# Patient Record
Sex: Female | Born: 1955 | Race: Black or African American | Hispanic: No | Smoking: Former smoker
Health system: Southern US, Community
[De-identification: ages and names within clinical notes are randomized; demographics above are authoritative.]

## PROBLEM LIST (undated history)

## (undated) DIAGNOSIS — M069 Rheumatoid arthritis, unspecified: Secondary | ICD-10-CM

## (undated) DIAGNOSIS — C801 Malignant (primary) neoplasm, unspecified: Secondary | ICD-10-CM

## (undated) DIAGNOSIS — Z87898 Personal history of other specified conditions: Secondary | ICD-10-CM

## (undated) DIAGNOSIS — K2100 Gastro-esophageal reflux disease with esophagitis, without bleeding: Secondary | ICD-10-CM

## (undated) DIAGNOSIS — R1032 Left lower quadrant pain: Secondary | ICD-10-CM

## (undated) DIAGNOSIS — R198 Other specified symptoms and signs involving the digestive system and abdomen: Secondary | ICD-10-CM

## (undated) DIAGNOSIS — M2550 Pain in unspecified joint: Secondary | ICD-10-CM

## (undated) DIAGNOSIS — R634 Abnormal weight loss: Secondary | ICD-10-CM

## (undated) DIAGNOSIS — E039 Hypothyroidism, unspecified: Secondary | ICD-10-CM

## (undated) DIAGNOSIS — I209 Angina pectoris, unspecified: Secondary | ICD-10-CM

## (undated) DIAGNOSIS — F39 Unspecified mood [affective] disorder: Secondary | ICD-10-CM

## (undated) DIAGNOSIS — R197 Diarrhea, unspecified: Secondary | ICD-10-CM

## (undated) DIAGNOSIS — Z8639 Personal history of other endocrine, nutritional and metabolic disease: Secondary | ICD-10-CM

## (undated) DIAGNOSIS — F32A Depression, unspecified: Secondary | ICD-10-CM

## (undated) DIAGNOSIS — B351 Tinea unguium: Secondary | ICD-10-CM

## (undated) DIAGNOSIS — F329 Major depressive disorder, single episode, unspecified: Secondary | ICD-10-CM

## (undated) DIAGNOSIS — K649 Unspecified hemorrhoids: Secondary | ICD-10-CM

## (undated) DIAGNOSIS — D649 Anemia, unspecified: Secondary | ICD-10-CM

## (undated) DIAGNOSIS — K5792 Diverticulitis of intestine, part unspecified, without perforation or abscess without bleeding: Secondary | ICD-10-CM

## (undated) DIAGNOSIS — M797 Fibromyalgia: Secondary | ICD-10-CM

## (undated) DIAGNOSIS — Z860101 Personal history of adenomatous and serrated colon polyps: Secondary | ICD-10-CM

## (undated) DIAGNOSIS — M1990 Unspecified osteoarthritis, unspecified site: Secondary | ICD-10-CM

## (undated) DIAGNOSIS — M3500 Sicca syndrome, unspecified: Secondary | ICD-10-CM

## (undated) DIAGNOSIS — B354 Tinea corporis: Secondary | ICD-10-CM

## (undated) DIAGNOSIS — G5600 Carpal tunnel syndrome, unspecified upper limb: Secondary | ICD-10-CM

## (undated) DIAGNOSIS — F419 Anxiety disorder, unspecified: Secondary | ICD-10-CM

## (undated) DIAGNOSIS — R112 Nausea with vomiting, unspecified: Secondary | ICD-10-CM

## (undated) DIAGNOSIS — Z86010 Personal history of colonic polyps: Secondary | ICD-10-CM

## (undated) DIAGNOSIS — G459 Transient cerebral ischemic attack, unspecified: Secondary | ICD-10-CM

## (undated) DIAGNOSIS — R1115 Cyclical vomiting syndrome unrelated to migraine: Secondary | ICD-10-CM

## (undated) DIAGNOSIS — R6883 Chills (without fever): Secondary | ICD-10-CM

## (undated) HISTORY — DX: Left lower quadrant pain: R10.32

## (undated) HISTORY — DX: Personal history of colonic polyps: Z86.010

## (undated) HISTORY — PX: CHOLECYSTECTOMY: SHX55

## (undated) HISTORY — DX: Hypothyroidism, unspecified: E03.9

## (undated) HISTORY — DX: Nausea with vomiting, unspecified: R11.2

## (undated) HISTORY — DX: Unspecified mood (affective) disorder: F39

## (undated) HISTORY — DX: Morbid (severe) obesity due to excess calories: E66.01

## (undated) HISTORY — DX: Rheumatoid arthritis, unspecified: M06.9

## (undated) HISTORY — DX: Personal history of adenomatous and serrated colon polyps: Z86.0101

## (undated) HISTORY — DX: Tinea unguium: B35.1

## (undated) HISTORY — PX: OTHER SURGICAL HISTORY: SHX169

## (undated) HISTORY — DX: Personal history of other specified conditions: Z87.898

## (undated) HISTORY — DX: Cyclical vomiting syndrome unrelated to migraine: R11.15

## (undated) HISTORY — DX: Pain in unspecified joint: M25.50

## (undated) HISTORY — DX: Gastro-esophageal reflux disease with esophagitis, without bleeding: K21.00

## (undated) HISTORY — DX: Depression, unspecified: F32.A

## (undated) HISTORY — DX: Abnormal weight loss: R63.4

## (undated) HISTORY — DX: Carpal tunnel syndrome, unspecified upper limb: G56.00

## (undated) HISTORY — DX: Personal history of other endocrine, nutritional and metabolic disease: Z86.39

## (undated) HISTORY — DX: Anemia, unspecified: D64.9

## (undated) HISTORY — PX: CARPAL TUNNEL RELEASE: SHX101

## (undated) HISTORY — DX: Other specified symptoms and signs involving the digestive system and abdomen: R19.8

## (undated) HISTORY — DX: Tinea corporis: B35.4

## (undated) HISTORY — DX: Unspecified hemorrhoids: K64.9

## (undated) HISTORY — PX: ADENOIDECTOMY: SUR15

## (undated) HISTORY — DX: Chills (without fever): R68.83

## (undated) HISTORY — DX: Fibromyalgia: M79.7

## (undated) HISTORY — DX: Diarrhea, unspecified: R19.7

---
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
## 1998-03-15 ENCOUNTER — Emergency Department (HOSPITAL_COMMUNITY): Admission: EM | Admit: 1998-03-15 | Discharge: 1998-03-16 | Payer: Self-pay | Admitting: Emergency Medicine

## 1998-11-29 ENCOUNTER — Inpatient Hospital Stay (HOSPITAL_COMMUNITY): Admission: AD | Admit: 1998-11-29 | Discharge: 1998-11-29 | Payer: Self-pay | Admitting: Obstetrics and Gynecology

## 1999-04-26 ENCOUNTER — Encounter: Admission: RE | Admit: 1999-04-26 | Discharge: 1999-07-25 | Payer: Self-pay | Admitting: Obstetrics and Gynecology

## 1999-07-06 ENCOUNTER — Encounter (INDEPENDENT_AMBULATORY_CARE_PROVIDER_SITE_OTHER): Payer: Self-pay | Admitting: Specialist

## 1999-07-06 ENCOUNTER — Encounter: Payer: Self-pay | Admitting: Obstetrics and Gynecology

## 1999-07-06 ENCOUNTER — Inpatient Hospital Stay (HOSPITAL_COMMUNITY): Admission: AD | Admit: 1999-07-06 | Discharge: 1999-07-12 | Payer: Self-pay | Admitting: Obstetrics and Gynecology

## 1999-07-13 ENCOUNTER — Encounter: Admission: RE | Admit: 1999-07-13 | Discharge: 1999-10-11 | Payer: Self-pay | Admitting: Obstetrics and Gynecology

## 1999-07-14 ENCOUNTER — Encounter: Admission: RE | Admit: 1999-07-14 | Discharge: 1999-07-14 | Payer: Self-pay | Admitting: Obstetrics and Gynecology

## 1999-07-14 ENCOUNTER — Encounter: Payer: Self-pay | Admitting: Obstetrics and Gynecology

## 1999-10-13 ENCOUNTER — Encounter (HOSPITAL_COMMUNITY): Admission: RE | Admit: 1999-10-13 | Discharge: 2000-01-11 | Payer: Self-pay | Admitting: Obstetrics and Gynecology

## 2000-01-13 ENCOUNTER — Encounter: Admission: RE | Admit: 2000-01-13 | Discharge: 2000-04-12 | Payer: Self-pay | Admitting: Obstetrics and Gynecology

## 2000-04-13 ENCOUNTER — Encounter: Admission: RE | Admit: 2000-04-13 | Discharge: 2000-05-11 | Payer: Self-pay | Admitting: Obstetrics and Gynecology

## 2000-09-07 ENCOUNTER — Other Ambulatory Visit: Admission: RE | Admit: 2000-09-07 | Discharge: 2000-09-07 | Payer: Self-pay | Admitting: Obstetrics and Gynecology

## 2003-03-18 ENCOUNTER — Encounter: Admission: RE | Admit: 2003-03-18 | Discharge: 2003-03-18 | Payer: Self-pay | Admitting: Family Medicine

## 2004-07-02 ENCOUNTER — Ambulatory Visit (HOSPITAL_COMMUNITY): Admission: RE | Admit: 2004-07-02 | Discharge: 2004-07-02 | Payer: Self-pay | Admitting: Obstetrics and Gynecology

## 2004-07-02 ENCOUNTER — Encounter (INDEPENDENT_AMBULATORY_CARE_PROVIDER_SITE_OTHER): Payer: Self-pay | Admitting: Specialist

## 2007-08-03 ENCOUNTER — Emergency Department (HOSPITAL_COMMUNITY): Admission: EM | Admit: 2007-08-03 | Discharge: 2007-08-03 | Payer: Self-pay | Admitting: Emergency Medicine

## 2009-07-16 ENCOUNTER — Encounter: Admission: RE | Admit: 2009-07-16 | Discharge: 2009-07-16 | Payer: Self-pay | Admitting: Family Medicine

## 2009-11-05 ENCOUNTER — Emergency Department (HOSPITAL_COMMUNITY): Admission: EM | Admit: 2009-11-05 | Discharge: 2009-11-05 | Payer: Self-pay | Admitting: Emergency Medicine

## 2010-02-15 ENCOUNTER — Encounter: Admission: RE | Admit: 2010-02-15 | Discharge: 2010-02-15 | Payer: Self-pay | Admitting: Family Medicine

## 2010-06-28 ENCOUNTER — Other Ambulatory Visit: Payer: Self-pay | Admitting: Family Medicine

## 2010-06-28 DIAGNOSIS — Z1231 Encounter for screening mammogram for malignant neoplasm of breast: Secondary | ICD-10-CM

## 2010-07-07 ENCOUNTER — Ambulatory Visit
Admission: RE | Admit: 2010-07-07 | Discharge: 2010-07-07 | Disposition: A | Discharge status: Home / Self Care | Payer: BC Managed Care – PPO | Source: Ambulatory Visit | Attending: Family Medicine | Admitting: Family Medicine

## 2010-07-07 DIAGNOSIS — Z1231 Encounter for screening mammogram for malignant neoplasm of breast: Secondary | ICD-10-CM

## 2010-07-25 LAB — CBC
HCT: 39.5 % (ref 36.0–46.0)
Hemoglobin: 13.5 g/dL (ref 12.0–15.0)
MCH: 30.2 pg (ref 26.0–34.0)
MCHC: 34.2 g/dL (ref 30.0–36.0)
MCV: 88.5 fL (ref 78.0–100.0)
Platelets: 222 10*3/uL (ref 150–400)
RBC: 4.46 MIL/uL (ref 3.87–5.11)
RDW: 14.2 % (ref 11.5–15.5)
WBC: 5 10*3/uL (ref 4.0–10.5)

## 2010-07-25 LAB — BASIC METABOLIC PANEL
BUN: 14 mg/dL (ref 6–23)
CO2: 26 mEq/L (ref 19–32)
Calcium: 9 mg/dL (ref 8.4–10.5)
Chloride: 103 mEq/L (ref 96–112)
Creatinine, Ser: 0.91 mg/dL (ref 0.4–1.2)
GFR calc Af Amer: >60 mL/min (ref >60)
GFR calc non Af Amer: >60 mL/min (ref >60)
Glucose, Bld: 113 mg/dL — ABNORMAL HIGH (ref 70–99)
Potassium: 4 mEq/L (ref 3.5–5.1)
Sodium: 136 mEq/L (ref 135–145)

## 2010-07-25 LAB — DIFFERENTIAL
Basophils Absolute: 0 10*3/uL (ref 0.0–0.1)
Basophils Relative: 1 % (ref 0–1)
Eosinophils Absolute: 0.1 10*3/uL (ref 0.0–0.7)
Eosinophils Relative: 1 % (ref 0–5)
Lymphocytes Relative: 39 % (ref 12–46)
Lymphs Abs: 1.9 10*3/uL (ref 0.7–4.0)
Monocytes Absolute: 0.4 10*3/uL (ref 0.1–1.0)
Monocytes Relative: 9 % (ref 3–12)
Neutro Abs: 2.6 10*3/uL (ref 1.7–7.7)
Neutrophils Relative %: 51 % (ref 43–77)

## 2010-07-25 LAB — PROTIME-INR
INR: 0.89 (ref 0.00–1.49)
Prothrombin Time: 12 seconds (ref 11.6–15.2)

## 2010-07-25 LAB — D-DIMER, QUANTITATIVE: D-Dimer, Quant: 0.51 ug/mL-FEU — ABNORMAL HIGH (ref 0.00–0.48)

## 2010-07-25 LAB — APTT: aPTT: 36 seconds (ref 24–37)

## 2010-09-10 ENCOUNTER — Other Ambulatory Visit: Payer: Self-pay | Admitting: Obstetrics and Gynecology

## 2010-09-10 ENCOUNTER — Ambulatory Visit (HOSPITAL_COMMUNITY)
Admission: RE | Admit: 2010-09-10 | Discharge: 2010-09-10 | Disposition: A | Discharge status: Home / Self Care | Payer: BC Managed Care – PPO | Source: Ambulatory Visit | Attending: Obstetrics and Gynecology | Admitting: Obstetrics and Gynecology

## 2010-09-10 DIAGNOSIS — R9389 Abnormal findings on diagnostic imaging of other specified body structures: Secondary | ICD-10-CM | POA: Insufficient documentation

## 2010-09-10 DIAGNOSIS — N950 Postmenopausal bleeding: Secondary | ICD-10-CM | POA: Insufficient documentation

## 2010-09-10 LAB — COMPREHENSIVE METABOLIC PANEL
ALT: 20 U/L (ref 0–35)
AST: 18 U/L (ref 0–37)
Albumin: 4.1 g/dL (ref 3.5–5.2)
Alkaline Phosphatase: 52 U/L (ref 39–117)
BUN: 19 mg/dL (ref 6–23)
CO2: 25 mEq/L (ref 19–32)
Calcium: 10.2 mg/dL (ref 8.4–10.5)
Chloride: 104 mEq/L (ref 96–112)
Creatinine, Ser: 0.97 mg/dL (ref 0.4–1.2)
GFR calc Af Amer: >60 mL/min (ref >60)
GFR calc non Af Amer: >60 mL/min (ref >60)
Glucose, Bld: 105 mg/dL — ABNORMAL HIGH (ref 70–99)
Potassium: 4.1 mEq/L (ref 3.5–5.1)
Sodium: 140 mEq/L (ref 135–145)
Total Bilirubin: 0.3 mg/dL (ref 0.3–1.2)
Total Protein: 7.2 g/dL (ref 6.0–8.3)

## 2010-09-10 LAB — CBC
HCT: 40.7 % (ref 36.0–46.0)
Hemoglobin: 13.4 g/dL (ref 12.0–15.0)
MCH: 28.3 pg (ref 26.0–34.0)
MCHC: 32.9 g/dL (ref 30.0–36.0)
MCV: 86 fL (ref 78.0–100.0)
Platelets: 219 10*3/uL (ref 150–400)
RBC: 4.73 MIL/uL (ref 3.87–5.11)
RDW: 14.5 % (ref 11.5–15.5)
WBC: 4.4 10*3/uL (ref 4.0–10.5)

## 2010-09-14 NOTE — Op Note (Signed)
  NAME:  Jennifer Franco, GRAMAJO NO.:  [id number]  MEDICAL RECORD NO.:  [id number]           PATIENT TYPE:  O  LOCATION:  [facility]                          [facility]  PHYSICIAN:  Lenoard Aden, M.D.DATE OF BIRTH:  02-Jun-1955  DATE OF PROCEDURE:  09/10/2010 DATE OF DISCHARGE:                              OPERATIVE REPORT   PREOPERATIVE DIAGNOSES:  Postmenopausal bleeding with thickened 18 mm endometrium and questionable structural lesion.  POSTOPERATIVE DIAGNOSES:  Normal endometrial cavity with 2 areas of hemorrhagic focus on the anterior and posterior wall.  PROCEDURES:  Diagnostic hysteroscopy, resectoscope, dilation and curettage.  SURGEON:  Lenoard Aden, MD  ASSISTANT:  None.  ANESTHESIA:  General and local.  ESTIMATED BLOOD LOSS:  Less than 50 mL.  FLUID DEFICIT:  70 mL.  COMPLICATIONS:  None.  DRAINS:  Foley.  COUNTS:  Correct.  The patient recovered in good condition.  BRIEF OPERATIVE NOTE:  After being apprised the risks of anesthesia, infection, bleeding, injury to abdominal organs, need for repair delayed versus complications to include bowel and bladder injury, need for repair, inability to cure cancer, the patient was brought to the operating room where she was administered general anesthetic without complications, prepped and draped in sterile fashion, catheterized till the bladder was empty.  Exam under anesthesia revealed a small anteflexed uterus.  No adnexal masses.  Dilute Marcaine solution was placed, standard paracervical block 20 mL total.  Dilute Pitressin solution placed, 3 and 9 o'clock at the cervicovaginal junction.  Cervix was grasped using a single-tooth tenaculum, dilated easily up to #27 [facility] dilator.  Hysteroscope was placed.  Visualization revealed a normal endometrial cavity.  There were 2 areas, one on the anterior wall and one on the posterior wall where there appears to be some hemorrhagic- appearing  discoloration of the wall.  These areas were resected using the right-angle loop in 1 pass anteriorly and 1 pass posteriorly.  A sharp curettage was used in a four quadrant method to obtain endometrial curettings.  Revisualization of the endometrial cavity revealed bilateral normal tubal ostia and otherwise normal endometrial cavity.  Good hemostasis was noted.  All instruments were removed.  The patient tolerated the procedure well, was awakened and transferred to recovery in good condition.     Lenoard Aden, M.D.     RJT/MEDQ  D:  09/10/2010  T:  09/11/2010  Job:  [id number]  Electronically Signed by Olivia Mackie M.D. on 09/14/2010 02:45:40 PM

## 2010-09-24 NOTE — Op Note (Signed)
NAME:  Jennifer Franco, Jennifer Franco NO.:  [id number]   MEDICAL RECORD NO.:  [id number]          PATIENT TYPE:  AMB   LOCATION:  [facility]                           [facility]   PHYSICIAN:  Lenoard Aden, M.D.DATE OF BIRTH:  05/22/1955   DATE OF PROCEDURE:  07/02/2004  DATE OF DISCHARGE:                                 OPERATIVE REPORT   CHIEF COMPLAINT:  Dysfunctional uterine bleeding with questionable  structural defect.   POSTOPERATIVE DIAGNOSIS:  Multiple endometrial polyps.   PROCEDURE:  Diagnostic hysteroscopy with resectoscopic polypectomy and  dilatation and curettage.   SURGEON:  Lenoard Aden, M.D.   ANESTHESIA:  General.   ESTIMATED BLOOD LOSS:  Less than 50 mL.   FLUID DEFICIT:  100 mL.   COMPLICATIONS:  None.   Patient to recovery in good condition.   BRIEF OPERATIVE NOTE:  After being apprised of the risks of anesthesia,  infection, bleeding, injury to abdominal organs with need for repair,  uterine perforation and possible need for repair, the patient was brought to  the operating room, where she was administered a general anesthetic without  complications, prepped and draped in the usual sterile fashion, catheterized  until the bladder is empty.  Exam under anesthesia reveals a bulky,  anteflexed uterus.  Dilute Pitressin solution placed at 3 and 9 o'clock at  the cervicovaginal junction after placement of a speculum, total 16 mL  placed.  The uterus is easily dilated up to a #33 [employer] dilator,  hysteroscope placed.  Visualization reveals a markedly thickened anterior  and posterior wall with a bulging mass into the endometrial cavity  consistent with a large endometrial polyp.  At this point this polyp is  resected in its entirety using a double right-angle loop, and posterior wall  is resected as well.  Good hemostasis is noted, pictures are taken.  Endometrial cavity appears clean.  D&C is performed.  Endometrial curetting  sent to  pathology.  Fluid deficit 100 mL.  The procedure is terminated.  All  instruments are removed.  The patient is awakened and transferred to  recovery in good condition.      RJT/MEDQ  D:  07/02/2004  T:  07/02/2004  Job:  [id number]

## 2010-09-24 NOTE — Discharge Summary (Signed)
[facility]  Patient:    Jennifer Franco, Jennifer Franco                 MRN: [id number] Adm. Date:  [id number] Disc. Date: [id number] Attending:  Maxie Better                           Discharge Summary  DISCHARGE DIAGNOSES:          1. Preterm prolonged rupture of membranes.                               2. Twin gestation at 77+ weeks gestational age.                               3. Preterm labor.                               4. Class A2 diabetes mellitus.                               5. Hypothyroidism.                               6. Rh negative.                               7. Advanced maternal age.                               8. Previous cesarean section.                               9. Morbid obesity.  PROCEDURE:                    Repeat low transverse cesarean section on July 09, 1999.  HISTORY OF PRESENT ILLNESS:   A 55 year old woman, gravida 4, para 0-1-2-1, [facility] Sep 14, 1999, with twin gestation at 30-1/[redacted] weeks gestational age admitted with preterm premature rupture of membranes.  The patient had leaked fluid since 6:50 a.m. on July 06, 1999.  Antenatal course was remarkable for:  1) Insulin-dependent gestational diabetes.  The patient has controlled her glucose values suboptimally. 2) Advanced maternal age.  Amniocentesis performed at [facility] revealed 46XX and 46XY chromosomes.  HOSPITAL COURSE:              The patient was admitted on July 06, 1999. At that time she was afebrile.  No contractions were seen and fetal heart rate tracing was reassuring.  She was given betamethasone on February 27 and February 28 to enhance chance of fetal lung maturity.  Penicillin-G chemoprophylaxis for Group B Strep was given.  Uterine contractions increased and magnesium sulfate was administered until 24 hours after the second betamethasone injection.  Labor ensued on July 09, 1999.  Cesarean section was advised because of  the transverse lie of twin B.  The patient was taken to the operating room where she underwent repeat low transverse cesarean section by Sung Amabile. Roslyn Smiling, M.D. and Gerlene Burdock  Mickeal Needy, M.D.  under spinal anesthesia.  Twin A was vertex female, 1500 grams with Apgars of 7 and 9.  Copious fluid was noted in the sac B.  The baby moved after membranes were ruptured to a backup transverse lie and was delivered as a total breech extraction. Twin B was a female, 1224 grams with Apgars of 4 and 9.  Arterial cord pH was 7.26 for twin A and 7.19 for twin B.  The patients postoperative course was unremarkable.  Her hemoglobin stabilized t 12.1.  Diet was advanced without difficulty.  She was discharged to home on the  third postoperative day.  RhoGAM was given before discharge.  DISCHARGE MEDICATIONS:        1. Prenatal vitamins.                               2. Tylox 30 tablets one to two p.o. q.3-4h. p.r.n.                                  pain.                               3. Nonsteroidal anti-inflammatory agents.  FOLLOW-UP:                    Will be in the office later in the week for staple removal.  Two-hour glucose tolerance test will be performed postpartum.  She was encouraged to initiate weight reduction.  She was given routine status post cesarean section instructions. DD:  08/07/99 TD:  08/07/99 Job: [id number]

## 2010-09-24 NOTE — H&P (Signed)
NAME:  Jennifer Franco, Jennifer Franco NO.:  [id number]   MEDICAL RECORD NO.:  [id number]          PATIENT TYPE:  AMB   LOCATION:  [facility]                           [facility]   PHYSICIAN:  Lenoard Aden, M.D.DATE OF BIRTH:  November 01, 1955   DATE OF ADMISSION:  07/02/2004  DATE OF DISCHARGE:                                HISTORY & PHYSICAL   CHIEF COMPLAINT:  Dysfunctional uterine bleeding.   HISTORY OF PRESENT ILLNESS:  Patient is a 55 year old African-American  female with dysfunctional uterine bleeding and structural lesion for  definitive therapy.  She is a G2, P3.  Medications include Synthroid and  vitamin B complex.  She has no known drug allergies.  She has a  noncontributory family history.  She has a history of two previous  deliveries with a history of twins noted,  previously has been status post  __________ .   PHYSICAL EXAMINATION:  She is a well-developed, well-nourished African-  American female in no acute distress.  HEENT normal.  Lungs clear.  Heart  regular rhythm.  Abdomen soft, nontender.  Pelvic exam reveals a normal-size  uterus with questionable posterior wall defect, no adnexal masses.   IMPRESSION:  Questionable structural lesion with known dysfunctional uterine  bleeding.   PLAN:  Proceed with diagnostic hysteroscopy via resectoscope D&C.  Risks of  anesthesia, infection, bleeding, injury to abdominal organs, need for  repairs discussed, delayed versus immediate complications to include bowel  and bladder injury are noted.  Patient acknowledges and wishes to proceed.      RJT/MEDQ  D:  07/01/2004  T:  07/01/2004  Job:  [id number]

## 2011-01-31 LAB — POCT CARDIAC MARKERS
CKMB, poc: 1
CKMB, poc: <1 — ABNORMAL LOW
Myoglobin, poc: 47.6
Myoglobin, poc: 59.7
Operator id: 285841
Operator id: 288831
Troponin i, poc: <0.05
Troponin i, poc: <0.05

## 2011-01-31 LAB — CBC
HCT: 35.9 — ABNORMAL LOW
Hemoglobin: 12
MCHC: 33.3
MCV: 85.6
Platelets: 317
RBC: 4.2
RDW: 14.3
WBC: 5.5

## 2011-01-31 LAB — POCT I-STAT, CHEM 8
BUN: 18
Calcium, Ion: 1.14
Chloride: 107
Creatinine, Ser: 1.2
Glucose, Bld: 90
HCT: 38
Hemoglobin: 12.9
Potassium: 4
Sodium: 139
TCO2: 25

## 2011-01-31 LAB — DIFFERENTIAL
Basophils Absolute: 0.1
Basophils Relative: 1
Eosinophils Absolute: 0.1
Eosinophils Relative: 2
Lymphocytes Relative: 35
Lymphs Abs: 1.9
Monocytes Absolute: 0.4
Monocytes Relative: 7
Neutro Abs: 3
Neutrophils Relative %: 55

## 2011-06-20 ENCOUNTER — Other Ambulatory Visit: Payer: Self-pay | Admitting: Family Medicine

## 2011-06-20 DIAGNOSIS — Z1231 Encounter for screening mammogram for malignant neoplasm of breast: Secondary | ICD-10-CM

## 2011-07-11 ENCOUNTER — Ambulatory Visit
Admission: RE | Admit: 2011-07-11 | Discharge: 2011-07-11 | Disposition: A | Discharge status: Home / Self Care | Payer: BC Managed Care – PPO | Source: Ambulatory Visit | Attending: Family Medicine | Admitting: Family Medicine

## 2011-07-11 DIAGNOSIS — Z1231 Encounter for screening mammogram for malignant neoplasm of breast: Secondary | ICD-10-CM

## 2011-10-21 DIAGNOSIS — M3500 Sicca syndrome, unspecified: Secondary | ICD-10-CM | POA: Insufficient documentation

## 2011-10-21 DIAGNOSIS — M47812 Spondylosis without myelopathy or radiculopathy, cervical region: Secondary | ICD-10-CM | POA: Insufficient documentation

## 2012-07-02 ENCOUNTER — Other Ambulatory Visit: Payer: Self-pay | Admitting: Family Medicine

## 2012-07-02 DIAGNOSIS — Z1231 Encounter for screening mammogram for malignant neoplasm of breast: Secondary | ICD-10-CM

## 2012-08-03 ENCOUNTER — Ambulatory Visit
Admission: RE | Admit: 2012-08-03 | Discharge: 2012-08-03 | Disposition: A | Discharge status: Home / Self Care | Payer: BC Managed Care – PPO | Source: Ambulatory Visit | Attending: Family Medicine | Admitting: Family Medicine

## 2012-08-03 DIAGNOSIS — Z1231 Encounter for screening mammogram for malignant neoplasm of breast: Secondary | ICD-10-CM

## 2013-06-30 ENCOUNTER — Emergency Department (INDEPENDENT_AMBULATORY_CARE_PROVIDER_SITE_OTHER)
Admission: EM | Admit: 2013-06-30 | Discharge: 2013-06-30 | Disposition: A | Discharge status: Home / Self Care | Payer: BC Managed Care – PPO | Source: Home / Self Care | Attending: Family Medicine | Admitting: Family Medicine

## 2013-06-30 ENCOUNTER — Encounter (HOSPITAL_COMMUNITY): Payer: Self-pay | Admitting: Emergency Medicine

## 2013-06-30 DIAGNOSIS — A088 Other specified intestinal infections: Secondary | ICD-10-CM

## 2013-06-30 DIAGNOSIS — A084 Viral intestinal infection, unspecified: Secondary | ICD-10-CM

## 2013-06-30 HISTORY — DX: Sjogren syndrome, unspecified: M35.00

## 2013-06-30 MED ORDER — ONDANSETRON 4 MG PO TBDP
ORAL_TABLET | ORAL | Status: AC
  Filled 2013-06-30: qty 2

## 2013-06-30 MED ORDER — ONDANSETRON HCL 8 MG PO TABS: 8.0000 mg | ORAL_TABLET | Freq: Three times a day (TID) | ORAL | Status: DC | PRN

## 2013-06-30 NOTE — ED Notes (Signed)
Vomiting and diarrhea with fever since yesterday.  No relief with otc meds.

## 2013-06-30 NOTE — Discharge Instructions (Signed)
Thank you for coming in today. Take zofran every 8 hours as needed for vomiting.  If your belly pain worsens, or you have high fever, bad vomiting, blood in your stool or black tarry stool go to the Emergency Room.   Viral Gastroenteritis Viral gastroenteritis is also known as stomach flu. This condition affects the stomach and intestinal tract. It can cause sudden diarrhea and vomiting. The illness typically lasts 3 to 8 days. Most people develop an immune response that eventually gets rid of the virus. While this natural response develops, the virus can make you quite ill. CAUSES  Many different viruses can cause gastroenteritis, such as rotavirus or noroviruses. You can catch one of these viruses by consuming contaminated food or water. You may also catch a virus by sharing utensils or other personal items with an infected person or by touching a contaminated surface. SYMPTOMS  The most common symptoms are diarrhea and vomiting. These problems can cause a severe loss of body fluids (dehydration) and a body salt (electrolyte) imbalance. Other symptoms may include:  Fever.  Headache.  Fatigue.  Abdominal pain. DIAGNOSIS  Your caregiver can usually diagnose viral gastroenteritis based on your symptoms and a physical exam. A stool sample may also be taken to test for the presence of viruses or other infections. TREATMENT  This illness typically goes away on its own. Treatments are aimed at rehydration. The most serious cases of viral gastroenteritis involve vomiting so severely that you are not able to keep fluids down. In these cases, fluids must be given through an intravenous line (IV). HOME CARE INSTRUCTIONS   Drink enough fluids to keep your urine clear or pale yellow. Drink small amounts of fluids frequently and increase the amounts as tolerated.  Ask your caregiver for specific rehydration instructions.  Avoid:  Foods high in sugar.  Alcohol.  Carbonated  drinks.  Tobacco.  Juice.  Caffeine drinks.  Extremely hot or cold fluids.  Fatty, greasy foods.  Too much intake of anything at one time.  Dairy products until 24 to 48 hours after diarrhea stops.  You may consume probiotics. Probiotics are active cultures of beneficial bacteria. They may lessen the amount and number of diarrheal stools in adults. Probiotics can be found in yogurt with active cultures and in supplements.  Wash your hands well to avoid spreading the virus.  Only take over-the-counter or prescription medicines for pain, discomfort, or fever as directed by your caregiver. Do not give aspirin to children. Antidiarrheal medicines are not recommended.  Ask your caregiver if you should continue to take your regular prescribed and over-the-counter medicines.  Keep all follow-up appointments as directed by your caregiver. SEEK IMMEDIATE MEDICAL CARE IF:   You are unable to keep fluids down.  You do not urinate at least once every 6 to 8 hours.  You develop shortness of breath.  You notice blood in your stool or vomit. This may look like coffee grounds.  You have abdominal pain that increases or is concentrated in one small area (localized).  You have persistent vomiting or diarrhea.  You have a fever.  The patient is a child younger than 3 months, and he or she has a fever.  The patient is a child older than 3 months, and he or she has a fever and persistent symptoms.  The patient is a child older than 3 months, and he or she has a fever and symptoms suddenly get worse.  The patient is a baby, and he or  she has no tears when crying. MAKE SURE YOU:   Understand these instructions.  Will watch your condition.  Will get help right away if you are not doing well or get worse. Document Released: 04/25/2005 Document Revised: 07/18/2011 Document Reviewed: 02/09/2011 [facility] Patient Information 2014 [place].

## 2013-06-30 NOTE — ED Provider Notes (Signed)
Jennifer Franco is a 58 y.o. female who presents to Urgent Care today for nausea vomiting and diarrhea starting about 5:30 this morning. Patient denies any blood in the vomit or diarrhea. She tried some over-the-counter medications which have not helped. No fevers or chills. No dysuria. No significant abdominal pain. No other sick contacts.   Past Medical History  Diagnosis Date  . Sjogren's syndrome    History  Substance Use Topics  . Smoking status: Never Smoker   . Smokeless tobacco: Not on file  . Alcohol Use: Yes   ROS as above Medications: No current facility-administered medications for this encounter.   Current Outpatient Prescriptions  Medication Sig Dispense Refill  . ondansetron (ZOFRAN) 8 MG tablet Take 1 tablet (8 mg total) by mouth every 8 (eight) hours as needed for nausea or vomiting.  20 tablet  0    Exam:  BP 147/70  Pulse 52  Temp(Src) 99.2 F (37.3 C) (Oral)  Resp 18  SpO2 100% Gen: Well NAD HEENT: EOMI,  MMM Lungs: Normal work of breathing. CTABL Heart: RRR no MRG Abd: NABS, Soft. NT, ND no rebound or guarding Exts: Brisk capillary refill, warm and well perfused.   No results found for this or any previous visit (from the past 24 hour(s)). No results found.  Assessment and Plan: 58 y.o. female with viral gastroenteritis. Plan to treat with Zofran. Encourage oral hydration. Followup with primary care provider.  Discussed warning signs or symptoms. Please see discharge instructions. Patient expresses understanding.    Gregor Hams, MD 06/30/13 [phone]

## 2013-07-18 ENCOUNTER — Other Ambulatory Visit: Payer: Self-pay

## 2013-07-18 DIAGNOSIS — Z803 Family history of malignant neoplasm of breast: Secondary | ICD-10-CM

## 2013-07-18 DIAGNOSIS — Z1231 Encounter for screening mammogram for malignant neoplasm of breast: Secondary | ICD-10-CM

## 2013-08-08 ENCOUNTER — Ambulatory Visit
Admission: RE | Admit: 2013-08-08 | Discharge: 2013-08-08 | Disposition: A | Discharge status: Home / Self Care | Payer: BC Managed Care – PPO | Source: Ambulatory Visit

## 2013-08-08 DIAGNOSIS — Z1231 Encounter for screening mammogram for malignant neoplasm of breast: Secondary | ICD-10-CM

## 2013-08-08 DIAGNOSIS — Z803 Family history of malignant neoplasm of breast: Secondary | ICD-10-CM

## 2014-07-14 ENCOUNTER — Other Ambulatory Visit: Payer: Self-pay

## 2014-07-14 DIAGNOSIS — Z1231 Encounter for screening mammogram for malignant neoplasm of breast: Secondary | ICD-10-CM

## 2014-08-12 ENCOUNTER — Ambulatory Visit
Admission: RE | Admit: 2014-08-12 | Discharge: 2014-08-12 | Disposition: A | Discharge status: Home / Self Care | Payer: BC Managed Care – PPO | Source: Ambulatory Visit

## 2014-08-12 DIAGNOSIS — Z1231 Encounter for screening mammogram for malignant neoplasm of breast: Secondary | ICD-10-CM

## 2014-08-27 DIAGNOSIS — G894 Chronic pain syndrome: Secondary | ICD-10-CM | POA: Insufficient documentation

## 2014-08-29 DIAGNOSIS — M7918 Myalgia, other site: Secondary | ICD-10-CM | POA: Insufficient documentation

## 2014-08-29 DIAGNOSIS — M545 Low back pain, unspecified: Secondary | ICD-10-CM | POA: Insufficient documentation

## 2015-07-23 ENCOUNTER — Other Ambulatory Visit: Payer: Self-pay

## 2015-07-23 DIAGNOSIS — Z1231 Encounter for screening mammogram for malignant neoplasm of breast: Secondary | ICD-10-CM

## 2015-08-13 ENCOUNTER — Ambulatory Visit
Admission: RE | Admit: 2015-08-13 | Discharge: 2015-08-13 | Disposition: A | Discharge status: Home / Self Care | Payer: BC Managed Care – PPO | Source: Ambulatory Visit

## 2015-08-13 DIAGNOSIS — Z1231 Encounter for screening mammogram for malignant neoplasm of breast: Secondary | ICD-10-CM

## 2016-07-05 ENCOUNTER — Other Ambulatory Visit: Payer: Self-pay | Admitting: Family Medicine

## 2016-07-05 DIAGNOSIS — Z1231 Encounter for screening mammogram for malignant neoplasm of breast: Secondary | ICD-10-CM

## 2016-08-16 ENCOUNTER — Ambulatory Visit
Admission: RE | Admit: 2016-08-16 | Discharge: 2016-08-16 | Disposition: A | Discharge status: Home / Self Care | Payer: Medicare Other | Source: Ambulatory Visit | Attending: Family Medicine | Admitting: Family Medicine

## 2016-08-16 DIAGNOSIS — Z1231 Encounter for screening mammogram for malignant neoplasm of breast: Secondary | ICD-10-CM

## 2016-08-18 DIAGNOSIS — M3500 Sicca syndrome, unspecified: Secondary | ICD-10-CM | POA: Diagnosis not present

## 2016-08-18 DIAGNOSIS — Z79899 Other long term (current) drug therapy: Secondary | ICD-10-CM | POA: Diagnosis not present

## 2016-08-23 DIAGNOSIS — M3500 Sicca syndrome, unspecified: Secondary | ICD-10-CM | POA: Diagnosis not present

## 2016-08-23 DIAGNOSIS — E039 Hypothyroidism, unspecified: Secondary | ICD-10-CM | POA: Diagnosis not present

## 2016-08-25 DIAGNOSIS — I8390 Asymptomatic varicose veins of unspecified lower extremity: Secondary | ICD-10-CM | POA: Diagnosis not present

## 2016-08-25 DIAGNOSIS — R300 Dysuria: Secondary | ICD-10-CM | POA: Diagnosis not present

## 2016-08-25 DIAGNOSIS — M160 Bilateral primary osteoarthritis of hip: Secondary | ICD-10-CM | POA: Diagnosis not present

## 2016-08-25 DIAGNOSIS — Z79899 Other long term (current) drug therapy: Secondary | ICD-10-CM | POA: Diagnosis not present

## 2016-08-25 DIAGNOSIS — E039 Hypothyroidism, unspecified: Secondary | ICD-10-CM | POA: Diagnosis not present

## 2016-08-25 DIAGNOSIS — N760 Acute vaginitis: Secondary | ICD-10-CM | POA: Diagnosis not present

## 2016-08-25 DIAGNOSIS — M3500 Sicca syndrome, unspecified: Secondary | ICD-10-CM | POA: Diagnosis not present

## 2016-10-12 DIAGNOSIS — Z6836 Body mass index (BMI) 36.0-36.9, adult: Secondary | ICD-10-CM | POA: Diagnosis not present

## 2016-10-12 DIAGNOSIS — Z01419 Encounter for gynecological examination (general) (routine) without abnormal findings: Secondary | ICD-10-CM | POA: Diagnosis not present

## 2016-10-12 DIAGNOSIS — Z13220 Encounter for screening for lipoid disorders: Secondary | ICD-10-CM | POA: Diagnosis not present

## 2016-10-12 DIAGNOSIS — R3915 Urgency of urination: Secondary | ICD-10-CM | POA: Diagnosis not present

## 2016-10-12 DIAGNOSIS — R350 Frequency of micturition: Secondary | ICD-10-CM | POA: Diagnosis not present

## 2016-10-31 DIAGNOSIS — M79671 Pain in right foot: Secondary | ICD-10-CM | POA: Diagnosis not present

## 2016-11-25 DIAGNOSIS — R3915 Urgency of urination: Secondary | ICD-10-CM | POA: Diagnosis not present

## 2017-03-15 ENCOUNTER — Encounter: Payer: Self-pay | Admitting: Physical Therapy

## 2017-03-15 ENCOUNTER — Ambulatory Visit: Payer: BLUE CROSS/BLUE SHIELD | Attending: Family Medicine | Admitting: Physical Therapy

## 2017-03-15 DIAGNOSIS — G8929 Other chronic pain: Secondary | ICD-10-CM

## 2017-03-15 DIAGNOSIS — M545 Low back pain: Secondary | ICD-10-CM | POA: Insufficient documentation

## 2017-03-15 DIAGNOSIS — M25552 Pain in left hip: Secondary | ICD-10-CM | POA: Diagnosis not present

## 2017-03-15 DIAGNOSIS — M62830 Muscle spasm of back: Secondary | ICD-10-CM

## 2017-03-15 DIAGNOSIS — M6281 Muscle weakness (generalized): Secondary | ICD-10-CM | POA: Diagnosis not present

## 2017-03-15 NOTE — Patient Instructions (Signed)
  Glut Self Mobilization/Trigger Point Release  Begin supine on solid surface. Place lacrosse/baseball under glut on tender area (avoiding direct contact on bony surface). Straighten out leg of affected area and cross opposite leg over and place foot on floor. Remain still with pressure on area for 1 minute. If tolerated, roll over onto ball to create increased pressure in area. Move ball to new area and repeat.  Avoid rolling on top of ball, as this can create more irritation. Every day for 5 minutes    [facility] [address] [place], [place] [address] Phone # [phone] Fax [phone]

## 2017-03-16 NOTE — Therapy (Signed)
[facility] [address], [place], [place], [address] Phone: [phone]   Fax:  [phone]  Physical Therapy Evaluation  Patient Details  Name: Jennifer Franco MRN: [id number] Date of Birth: Jul 01, 1955 Referring Provider: Kelton Pillar, MD   Encounter Date: 03/15/2017  PT End of Session - 03/16/17 0802    Visit Number  1    Number of Visits  18    Date for PT Re-Evaluation  05/10/17    Authorization Type  BCBS/Medicare A and B    Authorization Time Period  03/15/17 to 05/10/17    Authorization - Visit Number  1    Authorization - Number of Visits  10    PT Start Time  8299    PT Stop Time  3716    PT Time Calculation (min)  44 min    Activity Tolerance  Patient tolerated treatment well;No increased pain    Behavior During Therapy  WFL for tasks assessed/performed       Past Medical History:  Diagnosis Date  . Sjogren's syndrome ([place])     History reviewed. No pertinent surgical history.  There were no vitals filed for this visit.   Subjective Assessment - 03/15/17 1456    Subjective  Pt reports that her low back started bothering her ~4 years ago. She had no accident or any specfic injury prior to noticing this. Since then, her back has gotten worse, but she has not had any symptoms going down her leg. She also notes some issues with her hip of feeling weak and unstable.     Pertinent History  Sjogren's syndrome    Limitations  Sitting;House hold activities    How long can you sit comfortably?  5-10 minutes     Patient Stated Goals  improve pain     Currently in Pain?  Yes    Pain Score  5     Pain Location  Back    Pain Orientation  Left;Lower    Pain Descriptors / Indicators  Aching    Pain Type  Chronic pain    Pain Radiating Towards  none     Pain Onset  More than a month ago    Pain Frequency  Constant    Aggravating Factors   prolonged sitting     Pain Relieving Factors  stretching her  muscles on her glider at home     Effect of Pain on Daily Activities  moderate limitation         [facility] PT Assessment - 03/16/17 0001      Assessment   Medical Diagnosis  Chronic LBP    Referring Provider  Kelton Pillar, MD    Prior Therapy  2 years ago with good results       Precautions   Precautions  None      Balance Screen   Has the patient fallen in the past 6 months  No    Has the patient had a decrease in activity level because of a fear of falling?   No    Is the patient reluctant to leave their home because of a fear of falling?   No      Prior Function   Level of Independence  Independent      Observation/Other Assessments   Observations  Sitting with BUE supporting on mat table     Focus on Therapeutic Outcomes (FOTO)   47% limited       Sensation  Light Touch  Appears Intact      AROM   Lumbar Flexion  25% limited     Lumbar Extension  25% limited     Lumbar - Right Side Bend  Pull Lt side of back     Lumbar - Left Side Bend  Pull Rt side of back       Strength   Right Hip Flexion  3+/5    Right Hip Extension  4/5    Right Hip ABduction  3+/5    Left Hip Flexion  3/5 pain lateral hip    pain lateral hip    Left Hip Extension  4/5    Left Hip ABduction  3/5    Right Knee Flexion  4/5    Right Knee Extension  5/5    Left Knee Flexion  4/5    Left Knee Extension  5/5    Right Ankle Dorsiflexion  5/5    Left Ankle Dorsiflexion  5/5      Flexibility   Soft Tissue Assessment /Muscle Length  yes    Hamstrings  WNL    Quadriceps  -- (+) thomas test Lt>Rt (hip to neutral)   (+) thomas test Lt>Rt (hip to neutral)   Piriformis  75% limited Lt>Rt       Palpation   Palpation comment  muscle spasm Lt glute max, glute med, Lt lumbar paraspinals      Special Tests    Special Tests  Lumbar    Lumbar Tests  Straight Leg Raise      Straight Leg Raise   Findings  Negative             Objective measurements completed on examination: See above  findings.      [facility] Adult PT Treatment/Exercise - 03/16/17 0001      Manual Therapy   Soft tissue mobilization  STM Lt glute max              PT Education - 03/16/17 0801    Education provided  Yes    Education Details  eval findings/POC; implications for manual techniques; self trigger point release at home     Person(s) Educated  Patient    Methods  Explanation;Handout    Comprehension  Verbalized understanding       PT Short Term Goals - 03/16/17 0812      PT SHORT TERM GOAL #1   Title  Pt will demo consistency and independence with her HEP to increase strength and decrease pain.     Time  4    Period  Weeks    Status  New    Target Date  04/05/17      PT SHORT TERM GOAL #2   Title  Pt will demo proper log roll technique with bed mobility during her session, without cuing from the therapist, in order to decrease strain on her low back.     Time  4    Period  Weeks    Status  New      PT SHORT TERM GOAL #3   Title  Pt will report atleast 25% resolution in her pain with daily activity from the start of therapy.     Time  4    Period  Weeks    Status  New        PT Long Term Goals - 03/16/17 1962      PT LONG TERM GOAL #1   Title  Pt will demo improved  BLE strength to 5/5 MMT which will increase her safety with stair negotiation and other activity around the house.     Time  8    Period  Weeks    Status  New    Target Date  05/10/17      PT LONG TERM GOAL #2   Title  Pt will demo improved hip extension ROM to atleast 10 deg, which will assist with glute activation during ambulation.     Time  8    Period  Weeks    Status  New      PT LONG TERM GOAL #3   Title  Pt will report atleast 75% improvement in her pain and other symptoms from the start of therapy, to increase her activity tolerance and quality of life.     Time  8    Period  Weeks      PT LONG TERM GOAL #4   Title  Pt will report atleast 50% decrease in her feelings of Lt hip instability  with daily activity.    Time  8    Period  Weeks    Status  New             Plan - April 10, 2017 0804    Clinical Impression Statement  Pt is a pleasant 61 y.o referred to OPPT with chronic Lt sided low back pain and lateral hip pain that has grown increasingly worse over the past couple of years. She presents today with tenderness across the low back as well as along her Lt lateral hip and gluteal region. She also demonstrates hip weakness, limitations in flexibility and decreased trunk strength evident by increased time to complete bed mobility and rolling during her evaluation. Therapist completed soft tissue techniques along the glute max, and pt reported significant improvements in pain with walking and sit to stand following this. She was encouraged to complete self-trigger point release at home as well. Pt would benefit from skilled PT to address her limitations in ROM, strength, flexibility and decrease muscle spasm for improved activity participation.    History and Personal Factors relevant to plan of care:  history of sciatica, reports good improvements with PT in the past     Clinical Presentation  Evolving    Clinical Presentation due to:  worsening     Clinical Decision Making  Low    Rehab Potential  Good    PT Frequency  2x / week    PT Duration  8 weeks    PT Treatment/Interventions  ADLs/Self Care Home Management;Cryotherapy;Electrical Stimulation;Moist Heat;Stair training;Functional mobility training;Therapeutic activities;Therapeutic exercise;Patient/family education;Neuromuscular re-education;Balance training;Manual techniques;Taping;Dry needling;Passive range of motion;Traction    PT Next Visit Plan  establish HEP to include deep abdominal activation, hip flexibility/strengthening; progress hip strength and trunk strength; manual techniques to address muscle spasm Lt lumbar spine/gluteals    PT Home Exercise Plan  self trigger point release     Consulted and Agree with Plan  of Care  Patient       Patient will benefit from skilled therapeutic intervention in order to improve the following deficits and impairments:  Abnormal gait, Decreased activity tolerance, Decreased balance, Difficulty walking, Impaired flexibility, Obesity, Hypomobility, Decreased strength, Decreased range of motion, Decreased mobility, Increased muscle spasms, Postural dysfunction, Pain, Improper body mechanics  Visit Diagnosis: Chronic low back pain, unspecified back pain laterality, with sciatica presence unspecified  Pain in left hip  Muscle weakness (generalized)  Muscle spasm of back  G-Codes - 04/10/2017 8527  Functional Assessment Tool Used (Outpatient Only)  FOTO: 47% limited     Functional Limitation  Mobility: Walking and moving around    Mobility: Walking and Moving Around Current Status [phone])  At least 40 percent but less than 60 percent impaired, limited or restricted    Mobility: Walking and Moving Around Goal Status [phone])  At least 20 percent but less than 40 percent impaired, limited or restricted        Problem List There are no active problems to display for this patient.   8:28 AM,03/16/17 Elly Modena PT, [facility] [facility] [address] [place], [place], [address] Phone: [phone]   Fax:  [phone]  Name: Jennifer Franco MRN: [id number] Date of Birth: [birth year]

## 2017-03-21 ENCOUNTER — Ambulatory Visit: Payer: BLUE CROSS/BLUE SHIELD

## 2017-03-21 DIAGNOSIS — M6281 Muscle weakness (generalized): Secondary | ICD-10-CM | POA: Diagnosis not present

## 2017-03-21 DIAGNOSIS — M25552 Pain in left hip: Secondary | ICD-10-CM | POA: Diagnosis not present

## 2017-03-21 DIAGNOSIS — G8929 Other chronic pain: Secondary | ICD-10-CM

## 2017-03-21 DIAGNOSIS — M62830 Muscle spasm of back: Secondary | ICD-10-CM

## 2017-03-21 DIAGNOSIS — M545 Low back pain: Secondary | ICD-10-CM | POA: Diagnosis not present

## 2017-03-21 NOTE — Therapy (Signed)
[facility] [address] [place], [place], [address] Phone: [phone]   Fax:  [phone]  Physical Therapy Treatment  Patient Details  Name: Jennifer Franco MRN: [id number] Date of Birth: [birth year] Referring Provider: Kelton Pillar, MD   Encounter Date: 03/21/2017  PT End of Session - 03/21/17 1059    Visit Number  2    Number of Visits  10    Date for PT Re-Evaluation  05/10/17    Authorization Type  Medicare: G-codes at 10    Authorization - Visit Number  2    Authorization - Number of Visits  10    PT Start Time  2778    PT Stop Time  1100    PT Time Calculation (min)  42 min    Activity Tolerance  Patient tolerated treatment well;No increased pain    Behavior During Therapy  WFL for tasks assessed/performed       Past Medical History:  Diagnosis Date  . Sjogren's syndrome ([place])     History reviewed. No pertinent surgical history.  There were no vitals filed for this visit.  Subjective Assessment - 03/21/17 1014    Subjective  I am feeling a little bit better today.      Currently in Pain?  Yes    Pain Score  4     Pain Location  Back    Pain Orientation  Left    Pain Descriptors / Indicators  Aching    Pain Type  Chronic pain    Pain Onset  More than a month ago    Pain Frequency  Constant    Aggravating Factors   sitting too long    Pain Relieving Factors  stretching her muscles, massage                      [facility] Adult PT Treatment/Exercise - 03/21/17 0001      Exercises   Exercises  Knee/Hip;Lumbar      Lumbar Exercises: Stretches   Active Hamstring Stretch  3 reps;10 seconds    Single Knee to Chest Stretch  3 reps;20 seconds    Lower Trunk Rotation  3 reps;20 seconds      Lumbar Exercises: Supine   Other Supine Lumbar Exercises  butterfly stretch     Other Supine Lumbar Exercises  gluteal release with ball on mat table      Knee/Hip Exercises: Aerobic   Nustep  Level 1x 8 minutes PT present to discuss progress      Knee/Hip Exercises: Sidelying   Clams  2x10 bil with abdominal bracing             PT Education - 03/21/17 1044    Education provided  Yes    Education Details  gluteal stretch, low trunk rotation, sidelying clam    Person(s) Educated  Patient    Methods  Handout    Comprehension  Verbalized understanding       PT Short Term Goals - 03/16/17 0812      PT SHORT TERM GOAL #1   Title  Pt will demo consistency and independence with her HEP to increase strength and decrease pain.     Time  4    Period  Weeks    Status  New    Target Date  04/05/17      PT SHORT TERM GOAL #2   Title  Pt will demo proper log roll technique with bed  mobility during her session, without cuing from the therapist, in order to decrease strain on her low back.     Time  4    Period  Weeks    Status  New      PT SHORT TERM GOAL #3   Title  Pt will report atleast 25% resolution in her pain with daily activity from the start of therapy.     Time  4    Period  Weeks    Status  New        PT Long Term Goals - 03/16/17 0817      PT LONG TERM GOAL #1   Title  Pt will demo improved BLE strength to 5/5 MMT which will increase her safety with stair negotiation and other activity around the house.     Time  8    Period  Weeks    Status  New    Target Date  05/10/17      PT LONG TERM GOAL #2   Title  Pt will demo improved hip extension ROM to atleast 10 deg, which will assist with glute activation during ambulation.     Time  8    Period  Weeks    Status  New      PT LONG TERM GOAL #3   Title  Pt will report atleast 75% improvement in her pain and other symptoms from the start of therapy, to increase her activity tolerance and quality of life.     Time  8    Period  Weeks      PT LONG TERM GOAL #4   Title  Pt will report atleast 50% decrease in her feelings of Lt hip instability with daily activity.    Time  8    Period  Weeks     Status  New            Plan - 03/21/17 1024    Clinical Impression Statement  Pt with only 1 session after evaluation.  PT iniated HEP for hip flexibility and core strength today.  Pt with trigger points in Lt gluteals and demonstrated improved tissue mobility aftere release with ball on the mat today.  PT reveiwed self trigger point release with pt today.  Pt will benefit from continued PT for flexibility, core strength and manual therapy for trigger points.      Rehab Potential  Good    PT Frequency  2x / week    PT Treatment/Interventions  ADLs/Self Care Home Management;Cryotherapy;Electrical Stimulation;Moist Heat;Stair training;Functional mobility training;Therapeutic activities;Therapeutic exercise;Patient/family education;Neuromuscular re-education;Balance training;Manual techniques;Taping;Dry needling;Passive range of motion;Traction    PT Next Visit Plan  manual therapy to spine and Lt gluteals, flexibility.  Dry needling to Lt gluteals and lumbar spine if pt wants    Recommended Other Services  initial plan of care is signed    Consulted and Agree with Plan of Care  Patient       Patient will benefit from skilled therapeutic intervention in order to improve the following deficits and impairments:  Abnormal gait, Decreased activity tolerance, Decreased balance, Difficulty walking, Impaired flexibility, Obesity, Hypomobility, Decreased strength, Decreased range of motion, Decreased mobility, Increased muscle spasms, Postural dysfunction, Pain, Improper body mechanics  Visit Diagnosis: Chronic low back pain, unspecified back pain laterality, with sciatica presence unspecified  Pain in left hip  Muscle weakness (generalized)  Muscle spasm of back     Problem List There are no active problems to display for this patient.  Sigurd Sos, PT 03/21/17 11:04 AM  [facility] [address] [place], [place],  [address] Phone: [phone]   Fax:  [phone]  Name: DENALI SHARMA MRN: [id number] Date of Birth: 1955/09/19

## 2017-03-21 NOTE — Patient Instructions (Addendum)
Perform all exercises below:  Hold _20___ seconds. Repeat _3___ times.  Do __3__ sessions per day. CAUTION: Movement should be gentle, steady and slow.  Knee to Chest  Lying supine, bend involved knee to chest. Perform with each leg.   Lumbar Rotation: Caudal - Bilateral (Supine)  Feet and knees together, arms outstretched, rotate knees left, turning head in opposite direction, until stretch is felt.      HIP: Hamstrings - Short Sitting   Rest leg on raised surface. Keep knee straight. Lift chest.   Piriformis Stretch, Sitting    Sit, one ankle on opposite knee, same-side hand on crossed knee. Push down on knee, keeping spine straight. Lean torso forward, with flat back, until tension is felt in hamstrings and gluteals of crossed-leg side. Hold _20__ seconds.  Repeat _3__ times per session. Do __3_ sessions per day.  Copyright  VHI. All rights reserved.   Abduction: Clam (Eccentric) - Side-Lying    Lie on side with knees bent. Lift top knee, keeping feet together. Keep trunk steady. Do 2 sets of 10 on each side.  1-2 times a day.    [facility] [address], [place], [place] [address] Phone # [phone] Fax [phone]

## 2017-03-23 ENCOUNTER — Ambulatory Visit: Payer: BLUE CROSS/BLUE SHIELD

## 2017-03-23 DIAGNOSIS — M25552 Pain in left hip: Secondary | ICD-10-CM | POA: Diagnosis not present

## 2017-03-23 DIAGNOSIS — M62830 Muscle spasm of back: Secondary | ICD-10-CM | POA: Diagnosis not present

## 2017-03-23 DIAGNOSIS — M545 Low back pain: Secondary | ICD-10-CM | POA: Diagnosis not present

## 2017-03-23 DIAGNOSIS — M6281 Muscle weakness (generalized): Secondary | ICD-10-CM

## 2017-03-23 DIAGNOSIS — G8929 Other chronic pain: Secondary | ICD-10-CM

## 2017-03-23 NOTE — Therapy (Signed)
[facility] [address] [place], [place], [address] Phone: [phone]   Fax:  [phone]  Physical Therapy Treatment  Patient Details  Name: Jennifer Franco MRN: [id number] Date of Birth: 24-Dec-1955 Referring Provider: Kelton Pillar, MD   Encounter Date: 03/23/2017  PT End of Session - 03/23/17 1108    Visit Number  3    Number of Visits  10    Date for PT Re-Evaluation  05/10/17    Authorization Type  Medicare: G-codes at 87    Authorization - Visit Number  3    Authorization - Number of Visits  10    PT Start Time  1020    PT Stop Time  1105    PT Time Calculation (min)  45 min    Activity Tolerance  Patient tolerated treatment well    Behavior During Therapy  [facility] for tasks assessed/performed       Past Medical History:  Diagnosis Date  . Sjogren's syndrome ([place])     History reviewed. No pertinent surgical history.  There were no vitals filed for this visit.  Subjective Assessment - 03/23/17 1024    Subjective  I want to wait and do dry needling next week.  My son wants to be here with me when I have it done.  My Rt hip felt like it was going to "give way" after treatment last session and I think it was the butterfly stretch.      Currently in Pain?  No/denies                      [facility] Adult PT Treatment/Exercise - 03/23/17 0001      Lumbar Exercises: Stretches   Active Hamstring Stretch  3 reps;10 seconds    Lower Trunk Rotation  3 reps;20 seconds used bolster on Rt side to reduce motion      Knee/Hip Exercises: Aerobic   Nustep  level 1 x 7 minutes      Manual Therapy   Manual Therapy  Soft tissue mobilization;Myofascial release;Joint mobilization    Joint Mobilization  PA mobs L2-5 grade 2-3 with rotation mobs    Soft tissue mobilization  use of orange roller ball on Lt gluteals in prone with hip IR/ER, Lt quadratus trigger point release               PT  Short Term Goals - 03/16/17 4627      PT SHORT TERM GOAL #1   Title  Pt will demo consistency and independence with her HEP to increase strength and decrease pain.     Time  4    Period  Weeks    Status  New    Target Date  04/05/17      PT SHORT TERM GOAL #2   Title  Pt will demo proper log roll technique with bed mobility during her session, without cuing from the therapist, in order to decrease strain on her low back.     Time  4    Period  Weeks    Status  New      PT SHORT TERM GOAL #3   Title  Pt will report atleast 25% resolution in her pain with daily activity from the start of therapy.     Time  4    Period  Weeks    Status  New        PT Long Term Goals - 03/16/17 0350  PT LONG TERM GOAL #1   Title  Pt will demo improved BLE strength to 5/5 MMT which will increase her safety with stair negotiation and other activity around the house.     Time  8    Period  Weeks    Status  New    Target Date  05/10/17      PT LONG TERM GOAL #2   Title  Pt will demo improved hip extension ROM to atleast 10 deg, which will assist with glute activation during ambulation.     Time  8    Period  Weeks    Status  New      PT LONG TERM GOAL #3   Title  Pt will report atleast 75% improvement in her pain and other symptoms from the start of therapy, to increase her activity tolerance and quality of life.     Time  8    Period  Weeks      PT LONG TERM GOAL #4   Title  Pt will report atleast 50% decrease in her feelings of Lt hip instability with daily activity.    Time  8    Period  Weeks    Status  New            Plan - 03/23/17 1028    Clinical Impression Statement  Pt is independent in compliant with HEP for flexibility and strength.  Pt asked to modify exercise today as she had an episode of Rt LE "giving way" after treatment and thought the exercises in clinic might have been the reason.  Pt with trigger points in Lt gluteals and responded well to trigger point  release. Pt with reduced feeling of tension after manual therapy today.   Pt will benefit from gentle progression of core and hip strength, dry needling to Lt gluteals and lumbar multifidi and manual therapy for trigger points.      Rehab Potential  Good    PT Frequency  2x / week    PT Duration  8 weeks    PT Treatment/Interventions  ADLs/Self Care Home Management;Cryotherapy;Electrical Stimulation;Moist Heat;Stair training;Functional mobility training;Therapeutic activities;Therapeutic exercise;Patient/family education;Neuromuscular re-education;Balance training;Manual techniques;Taping;Dry needling;Passive range of motion;Traction    PT Next Visit Plan  Dry needling to Lt gluteals and lumbar multifidi.  Manual, core strength and hip flexibility.      Consulted and Agree with Plan of Care  Patient       Patient will benefit from skilled therapeutic intervention in order to improve the following deficits and impairments:  Abnormal gait, Decreased activity tolerance, Decreased balance, Difficulty walking, Impaired flexibility, Obesity, Hypomobility, Decreased strength, Decreased range of motion, Decreased mobility, Increased muscle spasms, Postural dysfunction, Pain, Improper body mechanics  Visit Diagnosis: Chronic low back pain, unspecified back pain laterality, with sciatica presence unspecified  Pain in left hip  Muscle weakness (generalized)  Muscle spasm of back     Problem List There are no active problems to display for this patient.    Sigurd Sos, PT 03/23/17 11:10 AM  [facility] [address] [place], [place], [address] Phone: [phone]   Fax:  [phone]  Name: Jennifer Franco MRN: [id number] Date of Birth: [birth year]

## 2017-03-28 ENCOUNTER — Ambulatory Visit: Payer: BLUE CROSS/BLUE SHIELD | Admitting: Physical Therapy

## 2017-03-28 DIAGNOSIS — M25552 Pain in left hip: Secondary | ICD-10-CM

## 2017-03-28 DIAGNOSIS — G8929 Other chronic pain: Secondary | ICD-10-CM

## 2017-03-28 DIAGNOSIS — M62830 Muscle spasm of back: Secondary | ICD-10-CM | POA: Diagnosis not present

## 2017-03-28 DIAGNOSIS — M545 Low back pain: Principal | ICD-10-CM

## 2017-03-28 DIAGNOSIS — M6281 Muscle weakness (generalized): Secondary | ICD-10-CM | POA: Diagnosis not present

## 2017-03-28 NOTE — Therapy (Signed)
[facility] [address], [place], [place], [address] Phone: [phone]   Fax:  [phone]  Physical Therapy Treatment  Patient Details  Name: Jennifer Franco MRN: [id number] Date of Birth: [birth year] Referring Provider: Kelton Pillar, MD   Encounter Date: 03/28/2017  PT End of Session - 03/28/17 1539    Visit Number  4    Number of Visits  10    Date for PT Re-Evaluation  05/10/17    Authorization Type  Medicare: G-codes at 106    Authorization - Visit Number  4    Authorization - Number of Visits  10    PT Start Time  7654 Pt arrived late and time spent in the bathroom     PT Stop Time  1615    PT Time Calculation (min)  36 min    Activity Tolerance  Patient tolerated treatment well    Behavior During Therapy  [facility] for tasks assessed/performed       Past Medical History:  Diagnosis Date  . Sjogren's syndrome ([place])     No past surgical history on file.  There were no vitals filed for this visit.  Subjective Assessment - 03/28/17 1541    Subjective  Pt reports that things are going well. She was able to purchase her own ball to complete self massage at home. She feels that her symptoms have improved alot, atleast 20% from the start of therapy.     Currently in Pain?  No/denies                      [facility] Adult PT Treatment/Exercise - 03/28/17 0001      Lumbar Exercises: Supine   Other Supine Lumbar Exercises  Self gluteal release with spikey ball on table.       Knee/Hip Exercises: Aerobic   Nustep  L2 x5 min untimed      Manual Therapy   Soft tissue mobilization  STM Lt gluteals (max/med)        Trigger Point Dry Needling - 03/28/17 1618    Consent Given?  Yes    Education Handout Provided  Yes    Muscles Treated Lower Body  Gluteus maximus;Gluteus minimus    Gluteus Maximus Response  Twitch response elicited;Palpable increased muscle length    Gluteus Minimus Response   Twitch response elicited;Palpable increased muscle length           PT Education - 03/28/17 1616    Education provided  Yes    Education Details  dry needling; proper set up and use of the massage ball.     Person(s) Educated  Patient    Methods  Explanation;Handout    Comprehension  Verbalized understanding       PT Short Term Goals - 03/28/17 1638      PT SHORT TERM GOAL #1   Title  Pt will demo consistency and independence with her HEP to increase strength and decrease pain.     Time  4    Period  Weeks    Status  Achieved      PT SHORT TERM GOAL #2   Title  Pt will demo proper log roll technique with bed mobility during her session, without cuing from the therapist, in order to decrease strain on her low back.     Time  4    Period  Weeks    Status  Achieved      PT SHORT  TERM GOAL #3   Title  Pt will report atleast 25% resolution in her pain with daily activity from the start of therapy.     Baseline  20%    Time  4    Period  Weeks    Status  Partially Met        PT Long Term Goals - 03/16/17 0817      PT LONG TERM GOAL #1   Title  Pt will demo improved BLE strength to 5/5 MMT which will increase her safety with stair negotiation and other activity around the house.     Time  8    Period  Weeks    Status  New    Target Date  05/10/17      PT LONG TERM GOAL #2   Title  Pt will demo improved hip extension ROM to atleast 10 deg, which will assist with glute activation during ambulation.     Time  8    Period  Weeks    Status  New      PT LONG TERM GOAL #3   Title  Pt will report atleast 75% improvement in her pain and other symptoms from the start of therapy, to increase her activity tolerance and quality of life.     Time  8    Period  Weeks      PT LONG TERM GOAL #4   Title  Pt will report atleast 50% decrease in her feelings of Lt hip instability with daily activity.    Time  8    Period  Weeks    Status  New            Plan - 03/28/17  1620    Clinical Impression Statement  Pt arrived today with questions and concerns regarding the use of her massage ball at home. Therapist was able to address questions and assist pt with set up for home, pt demonstrating good understanding by the end of the session. Overall, pt reports atleast 20% improvement in her pain from the start of therapy. She reported no increase in pain following manual techniques and dry needling treatment this session. Will continue with current POC.     Rehab Potential  Good    PT Frequency  2x / week    PT Duration  8 weeks    PT Treatment/Interventions  ADLs/Self Care Home Management;Cryotherapy;Electrical Stimulation;Moist Heat;Stair training;Functional mobility training;Therapeutic activities;Therapeutic exercise;Patient/family education;Neuromuscular re-education;Balance training;Manual techniques;Taping;Dry needling;Passive range of motion;Traction    PT Next Visit Plan  f/u on dry needling and adjustments to self massage; Manual, core strength and hip flexibility progressions    Consulted and Agree with Plan of Care  Patient       Patient will benefit from skilled therapeutic intervention in order to improve the following deficits and impairments:  Abnormal gait, Decreased activity tolerance, Decreased balance, Difficulty walking, Impaired flexibility, Obesity, Hypomobility, Decreased strength, Decreased range of motion, Decreased mobility, Increased muscle spasms, Postural dysfunction, Pain, Improper body mechanics  Visit Diagnosis: Chronic low back pain, unspecified back pain laterality, with sciatica presence unspecified  Pain in left hip  Muscle weakness (generalized)  Muscle spasm of back     Problem List There are no active problems to display for this patient.   4:39 PM,03/28/17 Elly Modena PT, DPT [place] [facility] [place]  [facility] [address], [place], [place], [address] Phone: [phone]   Fax:  [phone]  Name: Jennifer Franco MRN: [id number] Date of Birth: 1955-08-07

## 2017-03-28 NOTE — Patient Instructions (Signed)

## 2017-04-03 ENCOUNTER — Ambulatory Visit: Payer: BLUE CROSS/BLUE SHIELD | Admitting: Physical Therapy

## 2017-04-03 DIAGNOSIS — M6281 Muscle weakness (generalized): Secondary | ICD-10-CM | POA: Diagnosis not present

## 2017-04-03 DIAGNOSIS — M25552 Pain in left hip: Secondary | ICD-10-CM

## 2017-04-03 DIAGNOSIS — G8929 Other chronic pain: Secondary | ICD-10-CM

## 2017-04-03 DIAGNOSIS — M62830 Muscle spasm of back: Secondary | ICD-10-CM

## 2017-04-03 DIAGNOSIS — M545 Low back pain: Secondary | ICD-10-CM | POA: Diagnosis not present

## 2017-04-03 NOTE — Therapy (Signed)
[facility] [address], [place], [place], [address] Phone: [phone]   Fax:  [phone]  Physical Therapy Treatment  Patient Details  Name: Jennifer Franco MRN: [id number] Date of Birth: February 07, 1956 Referring Provider: Kelton Pillar, MD   Encounter Date: 04/03/2017  PT End of Session - 04/03/17 1247    Visit Number  5    Number of Visits  10    Date for PT Re-Evaluation  05/10/17    Authorization Type  Medicare: G-codes at 10    Authorization - Visit Number  5    Authorization - Number of Visits  10    PT Start Time  1232    PT Stop Time  1310    PT Time Calculation (min)  38 min    Activity Tolerance  Patient tolerated treatment well;No increased pain    Behavior During Therapy  WFL for tasks assessed/performed       Past Medical History:  Diagnosis Date  . Sjogren's syndrome ([place])     No past surgical history on file.  There were no vitals filed for this visit.  Subjective Assessment - 04/03/17 1234    Subjective  Pt reports that she is feeling a huge improvement following the dry needling last session. She is noticing that her pain is not bothering her all the time with daily activity.     Currently in Pain?  No/denies            [facility] Adult PT Treatment/Exercise - 04/03/17 0001      Lumbar Exercises: Supine   Clam  10 reps;Limitations    Clam Limitations  green TB     Bridge  10 reps;Limitations    Bridge Limitations  2 sets, green TB around knees       Lumbar Exercises: Sidelying   Hip Abduction  10 reps;Other (comment) back against wall for technique       Knee/Hip Exercises: Stretches   Other Knee/Hip Stretches  piriformis stretch knee to opposite shoulder 5x10 sec each             PT Education - 04/03/17 1246    Education provided  Yes    Education Details  benefits of dry needling and gradual improvements expected in pain/spasm    Person(s) Educated  Patient     Methods  Explanation    Comprehension  Verbalized understanding       PT Short Term Goals - 03/28/17 1638      PT SHORT TERM GOAL #1   Title  Pt will demo consistency and independence with her HEP to increase strength and decrease pain.     Time  4    Period  Weeks    Status  Achieved      PT SHORT TERM GOAL #2   Title  Pt will demo proper log roll technique with bed mobility during her session, without cuing from the therapist, in order to decrease strain on her low back.     Time  4    Period  Weeks    Status  Achieved      PT SHORT TERM GOAL #3   Title  Pt will report atleast 25% resolution in her pain with daily activity from the start of therapy.     Baseline  20%    Time  4    Period  Weeks    Status  Partially Met        PT  Long Term Goals - 03/16/17 0817      PT LONG TERM GOAL #1   Title  Pt will demo improved BLE strength to 5/5 MMT which will increase her safety with stair negotiation and other activity around the house.     Time  8    Period  Weeks    Status  New    Target Date  05/10/17      PT LONG TERM GOAL #2   Title  Pt will demo improved hip extension ROM to atleast 10 deg, which will assist with glute activation during ambulation.     Time  8    Period  Weeks    Status  New      PT LONG TERM GOAL #3   Title  Pt will report atleast 75% improvement in her pain and other symptoms from the start of therapy, to increase her activity tolerance and quality of life.     Time  8    Period  Weeks      PT LONG TERM GOAL #4   Title  Pt will report atleast 50% decrease in her feelings of Lt hip instability with daily activity.    Time  8    Period  Weeks    Status  New            Plan - 04/03/17 1316    Clinical Impression Statement  Pt arrived reporting more improvements in muscle spasm following her last session of dry needling and manual treatment. Session focused primarily on therex to improve hip strength, with noted difficulty with end ranges  of hip extension during bridging activity. Pt was able to complete all exercises, however she did require encouragement intermittently to complete stretches appropriately secondary to decreased understanding of their importance. Ended session without report of increased pain, will continue with current POC.    Rehab Potential  Good    PT Frequency  2x / week    PT Duration  8 weeks    PT Treatment/Interventions  ADLs/Self Care Home Management;Cryotherapy;Electrical Stimulation;Moist Heat;Stair training;Functional mobility training;Therapeutic activities;Therapeutic exercise;Patient/family education;Neuromuscular re-education;Balance training;Manual techniques;Taping;Dry needling;Passive range of motion;Traction    PT Next Visit Plan   Manual, core strength and hip flexibility progressions    PT Home Exercise Plan  added bridge     Consulted and Agree with Plan of Care  Patient       Patient will benefit from skilled therapeutic intervention in order to improve the following deficits and impairments:  Abnormal gait, Decreased activity tolerance, Decreased balance, Difficulty walking, Impaired flexibility, Obesity, Hypomobility, Decreased strength, Decreased range of motion, Decreased mobility, Increased muscle spasms, Postural dysfunction, Pain, Improper body mechanics  Visit Diagnosis: Chronic low back pain, unspecified back pain laterality, with sciatica presence unspecified  Pain in left hip  Muscle weakness (generalized)  Muscle spasm of back     Problem List There are no active problems to display for this patient.  1:20 PM,04/03/17 Elly Modena PT, DPT [place] [facility] [place]  [facility] [address] [place], [place], [address] Phone: [phone]   Fax:  [phone]  Name: Jennifer Franco MRN: [id number] Date of Birth: 06/28/1955

## 2017-04-03 NOTE — Patient Instructions (Signed)
   BRIDGING ELASTIC BAND ABDUCTION  While lying on your back, place an elastic band around your knees and pull your knees apart. Hold this and then tighten your lower abdominals, squeeze your buttocks and raise your buttocks off the floor/bed as creating a "Bridge" with your body.    x10 reps, 2 sets    [facility] [address] [place], [place] [address] Phone # [phone] Fax [phone]

## 2017-04-06 ENCOUNTER — Ambulatory Visit: Payer: BLUE CROSS/BLUE SHIELD | Admitting: Physical Therapy

## 2017-04-06 DIAGNOSIS — G8929 Other chronic pain: Secondary | ICD-10-CM | POA: Diagnosis not present

## 2017-04-06 DIAGNOSIS — M545 Low back pain: Principal | ICD-10-CM

## 2017-04-06 DIAGNOSIS — M6281 Muscle weakness (generalized): Secondary | ICD-10-CM

## 2017-04-06 DIAGNOSIS — M25552 Pain in left hip: Secondary | ICD-10-CM | POA: Diagnosis not present

## 2017-04-06 DIAGNOSIS — M62830 Muscle spasm of back: Secondary | ICD-10-CM | POA: Diagnosis not present

## 2017-04-06 NOTE — Patient Instructions (Signed)
   Supine Hip Flexor Stretch  Lie on the back with the affected leg hanging off the table. Then pull the unaffected leg towards your body for a greater stretch and to support the lower back.   If the foot of the hanging affected leg touches the ground, bend the knee and plant/scoot the foot back until you feel a stretch on the top of the thigh.     Hold 30 sec, 2-3 times     [facility] [address], [place], [place] [address] Phone # [phone] Fax [phone]

## 2017-04-06 NOTE — Therapy (Signed)
[facility] [address], [place], [place], [address] Phone: [phone]   Fax:  [phone]  Physical Therapy Treatment  Patient Details  Name: Jennifer Franco MRN: [id number] Date of Birth: 1956/04/09 Referring Provider: Kelton Pillar, MD   Encounter Date: 04/06/2017  PT End of Session - 04/06/17 1026    Visit Number  6    Number of Visits  10    Date for PT Re-Evaluation  05/10/17    Authorization Type  Medicare: G-codes at 10    Authorization - Visit Number  6    Authorization - Number of Visits  10    PT Start Time  4332    PT Stop Time  1100    PT Time Calculation (min)  38 min    Activity Tolerance  Patient tolerated treatment well;No increased pain    Behavior During Therapy  WFL for tasks assessed/performed       Past Medical History:  Diagnosis Date  . Sjogren's syndrome ([place])     No past surgical history on file.  There were no vitals filed for this visit.  Subjective Assessment - 04/06/17 1130    Subjective  Pt reports that last night she had a rough night and tried to use her stick to work on her knots. She did not complete her HEP but she was working on her stairs.     Currently in Pain?  No/denies                      [facility] Adult PT Treatment/Exercise - 04/06/17 0001      Lumbar Exercises: Stretches   Hip Flexor Stretch  2 reps;30 seconds    Hip Flexor Stretch Limitations  LLE only    Piriformis Stretch  2 reps;30 seconds;Other (comment) figure 4       Lumbar Exercises: Supine   Bridge  10 reps    Bridge Limitations  x2 sets     Straight Leg Raise  10 reps;Limitations    Straight Leg Raises Limitations  x10 reps on Rt, unable to complete without pain on Lt, attempted heel slides on the Lt but this also increased pain so this was ended early     Other Supine Lumbar Exercises  self trigger point release Lt glute with smooth ball       Manual Therapy   Soft tissue  mobilization  STM Lt hip flexors/quads             PT Education - 04/06/17 1039    Education provided  Yes    Education Details  importance of completing full HEP as instructed to address specific limitations rather than only completing her stair work; use of ball for self trigger point release at home     Person(s) Educated  Patient    Methods  Explanation    Comprehension  Verbalized understanding       PT Short Term Goals - 03/28/17 1638      PT SHORT TERM GOAL #1   Title  Pt will demo consistency and independence with her HEP to increase strength and decrease pain.     Time  4    Period  Weeks    Status  Achieved      PT SHORT TERM GOAL #2   Title  Pt will demo proper log roll technique with bed mobility during her session, without cuing from the therapist, in order to decrease strain on  her low back.     Time  4    Period  Weeks    Status  Achieved      PT SHORT TERM GOAL #3   Title  Pt will report atleast 25% resolution in her pain with daily activity from the start of therapy.     Baseline  20%    Time  4    Period  Weeks    Status  Partially Met        PT Long Term Goals - 03/16/17 0817      PT LONG TERM GOAL #1   Title  Pt will demo improved BLE strength to 5/5 MMT which will increase her safety with stair negotiation and other activity around the house.     Time  8    Period  Weeks    Status  New    Target Date  05/10/17      PT LONG TERM GOAL #2   Title  Pt will demo improved hip extension ROM to atleast 10 deg, which will assist with glute activation during ambulation.     Time  8    Period  Weeks    Status  New      PT LONG TERM GOAL #3   Title  Pt will report atleast 75% improvement in her pain and other symptoms from the start of therapy, to increase her activity tolerance and quality of life.     Time  8    Period  Weeks      PT LONG TERM GOAL #4   Title  Pt will report atleast 50% decrease in her feelings of Lt hip instability with  daily activity.    Time  8    Period  Weeks    Status  New            Plan - 04/06/17 1151    Clinical Impression Statement  Pt has reported lack of HEP adherence over the past couple of days with increased pain last night. Therapist educated pt on the importance of completing her HEP to address specific limitations rather than substituting with her typical exercise routine of stair work. Pt verbalized understanding of this but will require further encouragement. Therapist was able to assess pt's technique with self-trigger point release and made several adjustments for improved understanding. Also introduced a hip flexor stretch to address limitations in hip flexibility. Ended session without report of increase in pain. Will continue with current POC.    Rehab Potential  Good    PT Frequency  2x / week    PT Duration  8 weeks    PT Treatment/Interventions  ADLs/Self Care Home Management;Cryotherapy;Electrical Stimulation;Moist Heat;Stair training;Functional mobility training;Therapeutic activities;Therapeutic exercise;Patient/family education;Neuromuscular re-education;Balance training;Manual techniques;Taping;Dry needling;Passive range of motion;Traction    PT Next Visit Plan  core strength and hip flexibility progressions; hip mobilizations/soft tissue work to address anterior restrictions    PT Home Exercise Plan  added bridge     Consulted and Agree with Plan of Care  Patient       Patient will benefit from skilled therapeutic intervention in order to improve the following deficits and impairments:  Abnormal gait, Decreased activity tolerance, Decreased balance, Difficulty walking, Impaired flexibility, Obesity, Hypomobility, Decreased strength, Decreased range of motion, Decreased mobility, Increased muscle spasms, Postural dysfunction, Pain, Improper body mechanics  Visit Diagnosis: Chronic low back pain, unspecified back pain laterality, with sciatica presence unspecified  Pain  in left hip  Muscle weakness (generalized)  Muscle spasm of back     Problem List There are no active problems to display for this patient.   11:59 AM,04/06/17 Elly Modena PT, [place] [facility] [address], [place], [place], [address] Phone: [phone]   Fax:  [phone]  Name: Jennifer Franco MRN: [id number] Date of Birth: 1955-12-26

## 2017-04-10 ENCOUNTER — Ambulatory Visit: Payer: BLUE CROSS/BLUE SHIELD | Attending: Family Medicine | Admitting: Physical Therapy

## 2017-04-10 DIAGNOSIS — M62830 Muscle spasm of back: Secondary | ICD-10-CM | POA: Diagnosis not present

## 2017-04-10 DIAGNOSIS — M25552 Pain in left hip: Secondary | ICD-10-CM | POA: Diagnosis not present

## 2017-04-10 DIAGNOSIS — M6281 Muscle weakness (generalized): Secondary | ICD-10-CM | POA: Diagnosis not present

## 2017-04-10 DIAGNOSIS — G8929 Other chronic pain: Secondary | ICD-10-CM | POA: Diagnosis not present

## 2017-04-10 DIAGNOSIS — M545 Low back pain: Secondary | ICD-10-CM | POA: Insufficient documentation

## 2017-04-10 NOTE — Therapy (Signed)
[facility] [address] [place], [place], [address] Phone: [phone]   Fax:  [phone]  Physical Therapy Treatment  Patient Details  Name: Jennifer Franco MRN: [id number] Date of Birth: 04-09-1956 Referring Provider: Kelton Pillar, MD   Encounter Date: 04/10/2017  PT End of Session - 04/10/17 1304    Visit Number  7    Number of Visits  10    Date for PT Re-Evaluation  05/10/17    Authorization Type  Medicare: G-codes at 10    Authorization - Visit Number  7    Authorization - Number of Visits  10    PT Start Time  6606    PT Stop Time  3016    PT Time Calculation (min)  39 min    Activity Tolerance  Patient tolerated treatment well;No increased pain    Behavior During Therapy  WFL for tasks assessed/performed       Past Medical History:  Diagnosis Date  . Sjogren's syndrome ([place])     No past surgical history on file.  There were no vitals filed for this visit.  Subjective Assessment - 04/10/17 1237    Subjective  Pt reports that things are better today. She has been much better than she has in months. If she twists funny, she will notice some pain in the hip, but otherwise things are great.     Currently in Pain?  No/denies                      [facility] Adult PT Treatment/Exercise - 04/10/17 0001      Ambulation/Gait   Pre-Gait Activities  ascend/descend steps with 2 handrails and reciprocal pattern x3 trials (+) knee valgus decreased stance time on the Lt      Lumbar Exercises: Stretches   Hip Flexor Stretch  3 reps;30 seconds    Hip Flexor Stretch Limitations  LLE only       Lumbar Exercises: Supine   Clam  5 reps    Clam Limitations  therapist providing cues to breath appropriately with LE movement    Bent Knee Raise  5 reps;Limitations    Bent Knee Raise Limitations  x2 sets each, with BUE pressdown     Other Supine Lumbar Exercises  bent knee fallout with BUE pressdown  and emphasis on breathing sequencing 2x5 reps each side       Manual Therapy   Soft tissue mobilization  STM Lt hip flexor/TFL/lateral quadriceps             PT Education - 04/10/17 1247    Education provided  Yes    Education Details  importance of avoiding stairs/exercise if experiencing pain; PT POC moving forward; benefits of proper breathing    Person(s) Educated  Patient    Methods  Explanation    Comprehension  Verbalized understanding       PT Short Term Goals - 04/10/17 1327      PT SHORT TERM GOAL #1   Title  Pt will demo consistency and independence with her HEP to increase strength and decrease pain.     Time  4    Period  Weeks    Status  Achieved      PT SHORT TERM GOAL #2   Title  Pt will demo proper log roll technique with bed mobility during her session, without cuing from the therapist, in order to decrease strain on her low back.  Time  4    Period  Weeks    Status  Achieved      PT SHORT TERM GOAL #3   Title  Pt will report atleast 25% resolution in her pain with daily activity from the start of therapy.     Time  4    Period  Weeks    Status  Achieved        PT Long Term Goals - 03/16/17 0817      PT LONG TERM GOAL #1   Title  Pt will demo improved BLE strength to 5/5 MMT which will increase her safety with stair negotiation and other activity around the house.     Time  8    Period  Weeks    Status  New    Target Date  05/10/17      PT LONG TERM GOAL #2   Title  Pt will demo improved hip extension ROM to atleast 10 deg, which will assist with glute activation during ambulation.     Time  8    Period  Weeks    Status  New      PT LONG TERM GOAL #3   Title  Pt will report atleast 75% improvement in her pain and other symptoms from the start of therapy, to increase her activity tolerance and quality of life.     Time  8    Period  Weeks      PT LONG TERM GOAL #4   Title  Pt will report atleast 50% decrease in her feelings of Lt  hip instability with daily activity.    Time  8    Period  Weeks    Status  New            Plan - 04/10/17 1327    Clinical Impression Statement  Pt arrived today noting improved pain from her last session, noting 0/10 rating upon arrival. She has also completed more of her HEP without reported difficulty. Session continued with focus on manual techniques and therex to improve hip flexibility and decrease pain with activity. Pt reported pain free bent knee march following soft tissue mobilization. Noting poor breathing sequencing with several exercises completed today, however this was improved with therapist feedback provided. Ended without any report of pain increase. Will continue with current POC.     Rehab Potential  Good    PT Frequency  2x / week    PT Duration  8 weeks    PT Treatment/Interventions  ADLs/Self Care Home Management;Cryotherapy;Electrical Stimulation;Moist Heat;Stair training;Functional mobility training;Therapeutic activities;Therapeutic exercise;Patient/family education;Neuromuscular re-education;Balance training;Manual techniques;Taping;Dry needling;Passive range of motion;Traction    PT Next Visit Plan  core strength and hip flexibility progressions; hip mobilizations/soft tissue work to address anterior restrictions    PT Home Exercise Plan  added bridge, supine march with UE press     Consulted and Agree with Plan of Care  Patient       Patient will benefit from skilled therapeutic intervention in order to improve the following deficits and impairments:  Abnormal gait, Decreased activity tolerance, Decreased balance, Difficulty walking, Impaired flexibility, Obesity, Hypomobility, Decreased strength, Decreased range of motion, Decreased mobility, Increased muscle spasms, Postural dysfunction, Pain, Improper body mechanics  Visit Diagnosis: Chronic low back pain, unspecified back pain laterality, with sciatica presence unspecified  Pain in left hip  Muscle  weakness (generalized)  Muscle spasm of back     Problem List There are no active problems to display for this  patient.   1:33 PM,04/10/17 Elly Modena PT, [place] [facility] [place]  [facility] [address], [place], [place], [address] Phone: [phone]   Fax:  [phone]  Name: DANALEE FLATH MRN: [id number] Date of Birth: 03/03/1956

## 2017-04-10 NOTE — Patient Instructions (Signed)
  BRACE SUPINE MARCHING  While lying on your back with your knees bent,  slowly raise up one foot a few inches and then set it back down.  Next, perform on your other leg.  Use your stomach muscles to keep your spine from moving.    2x5 reps each. press arms into the table and focus on breathing.    [place] [address], [place], [place] [address] Phone # [phone] Fax [phone]

## 2017-04-11 DIAGNOSIS — Z0000 Encounter for general adult medical examination without abnormal findings: Secondary | ICD-10-CM | POA: Diagnosis not present

## 2017-04-11 DIAGNOSIS — R350 Frequency of micturition: Secondary | ICD-10-CM | POA: Diagnosis not present

## 2017-04-11 DIAGNOSIS — Z136 Encounter for screening for cardiovascular disorders: Secondary | ICD-10-CM | POA: Diagnosis not present

## 2017-04-13 ENCOUNTER — Ambulatory Visit: Payer: BLUE CROSS/BLUE SHIELD | Admitting: Physical Therapy

## 2017-04-13 DIAGNOSIS — M25552 Pain in left hip: Secondary | ICD-10-CM

## 2017-04-13 DIAGNOSIS — M6281 Muscle weakness (generalized): Secondary | ICD-10-CM

## 2017-04-13 DIAGNOSIS — G8929 Other chronic pain: Secondary | ICD-10-CM | POA: Diagnosis not present

## 2017-04-13 DIAGNOSIS — M545 Low back pain: Secondary | ICD-10-CM | POA: Diagnosis not present

## 2017-04-13 DIAGNOSIS — M62830 Muscle spasm of back: Secondary | ICD-10-CM | POA: Diagnosis not present

## 2017-04-13 NOTE — Therapy (Signed)
[facility] [address] [place], [place], [address] Phone: [phone]   Fax:  [phone]  Physical Therapy Treatment  Patient Details  Name: Jennifer Franco MRN: [id number] Date of Birth: 1955/07/09 Referring Provider: Kelton Pillar, MD   Encounter Date: 04/13/2017  PT End of Session - 04/13/17 1026    Visit Number  8    Number of Visits  10    Date for PT Re-Evaluation  05/10/17    Authorization Type  Medicare: G-codes at 68    Authorization - Visit Number  7    Authorization - Number of Visits  10    PT Start Time  1740 Pt arrived late and requested to leave early     PT Stop Time  1045    PT Time Calculation (min)  20 min    Activity Tolerance  Patient tolerated treatment well;No increased pain    Behavior During Therapy  WFL for tasks assessed/performed       Past Medical History:  Diagnosis Date  . Sjogren's syndrome ([place])     No past surgical history on file.  There were no vitals filed for this visit.  Subjective Assessment - 04/13/17 1025    Subjective  Pt arrives requesting to leave early. She has been completing her exercises and stretches at home.     Currently in Pain?  No/denies                      [facility] Adult PT Treatment/Exercise - 04/13/17 0001      Knee/Hip Exercises: Supine   Hip Adduction Isometric  Both;1 set;15 reps;Other (comment) 5 sec hold     Other Supine Knee/Hip Exercises  Single leg hip abduction slide x10 reps each       Manual Therapy   Joint Mobilization  Lt hip anterior mobilization, grade III-IV x3 bouts              PT Education - 04/13/17 1030    Education provided  Yes    Education Details  encouraged full HEP adherence rather than picking/choosing her exercises.     Person(s) Educated  Patient    Methods  Explanation    Comprehension  Verbalized understanding       PT Short Term Goals - 04/10/17 1327      PT SHORT TERM  GOAL #1   Title  Pt will demo consistency and independence with her HEP to increase strength and decrease pain.     Time  4    Period  Weeks    Status  Achieved      PT SHORT TERM GOAL #2   Title  Pt will demo proper log roll technique with bed mobility during her session, without cuing from the therapist, in order to decrease strain on her low back.     Time  4    Period  Weeks    Status  Achieved      PT SHORT TERM GOAL #3   Title  Pt will report atleast 25% resolution in her pain with daily activity from the start of therapy.     Time  4    Period  Weeks    Status  Achieved        PT Long Term Goals - 03/16/17 8144      PT LONG TERM GOAL #1   Title  Pt will demo improved BLE strength to 5/5 MMT which will  increase her safety with stair negotiation and other activity around the house.     Time  8    Period  Weeks    Status  New    Target Date  05/10/17      PT LONG TERM GOAL #2   Title  Pt will demo improved hip extension ROM to atleast 10 deg, which will assist with glute activation during ambulation.     Time  8    Period  Weeks    Status  New      PT LONG TERM GOAL #3   Title  Pt will report atleast 75% improvement in her pain and other symptoms from the start of therapy, to increase her activity tolerance and quality of life.     Time  8    Period  Weeks      PT LONG TERM GOAL #4   Title  Pt will report atleast 50% decrease in her feelings of Lt hip instability with daily activity.    Time  8    Period  Weeks    Status  New            Plan - 04/13/17 1032    Clinical Impression Statement  Pt arrived late today and requested to leave early due to prior commitments, limiting activities completed during the session. Focused on therex and manual techniques to improve hip mobility/strength. Therapist encouraged pt to complete HEP as instructed in order to make up for today's shortened session and she verbalized understanding.     Rehab Potential  Good    PT  Frequency  2x / week    PT Duration  8 weeks    PT Treatment/Interventions  ADLs/Self Care Home Management;Cryotherapy;Electrical Stimulation;Moist Heat;Stair training;Functional mobility training;Therapeutic activities;Therapeutic exercise;Patient/family education;Neuromuscular re-education;Balance training;Manual techniques;Taping;Dry needling;Passive range of motion;Traction    PT Next Visit Plan  core strength and hip flexibility progressions; hip mobilizations/soft tissue work to address anterior restrictions    PT Home Exercise Plan  added bridge, supine march with UE press     Consulted and Agree with Plan of Care  Patient       Patient will benefit from skilled therapeutic intervention in order to improve the following deficits and impairments:  Abnormal gait, Decreased activity tolerance, Decreased balance, Difficulty walking, Impaired flexibility, Obesity, Hypomobility, Decreased strength, Decreased range of motion, Decreased mobility, Increased muscle spasms, Postural dysfunction, Pain, Improper body mechanics  Visit Diagnosis: Chronic low back pain, unspecified back pain laterality, with sciatica presence unspecified  Pain in left hip  Muscle weakness (generalized)  Muscle spasm of back     Problem List There are no active problems to display for this patient.   10:48 AM,04/13/17 [place], [employer] [facility] [place]  [facility] [address] [place], [place], [address] Phone: [phone]   Fax:  [phone]  Name: Jennifer Franco MRN: [id number] Date of Birth: 03-18-1956

## 2017-04-20 ENCOUNTER — Ambulatory Visit: Payer: BLUE CROSS/BLUE SHIELD | Admitting: Physical Therapy

## 2017-04-20 DIAGNOSIS — M25775 Osteophyte, left foot: Secondary | ICD-10-CM | POA: Diagnosis not present

## 2017-04-20 DIAGNOSIS — M545 Low back pain: Secondary | ICD-10-CM | POA: Diagnosis not present

## 2017-04-20 DIAGNOSIS — M62830 Muscle spasm of back: Secondary | ICD-10-CM | POA: Diagnosis not present

## 2017-04-20 DIAGNOSIS — M25774 Osteophyte, right foot: Secondary | ICD-10-CM | POA: Diagnosis not present

## 2017-04-20 DIAGNOSIS — G8929 Other chronic pain: Secondary | ICD-10-CM

## 2017-04-20 DIAGNOSIS — M6281 Muscle weakness (generalized): Secondary | ICD-10-CM | POA: Diagnosis not present

## 2017-04-20 DIAGNOSIS — M79671 Pain in right foot: Secondary | ICD-10-CM | POA: Diagnosis not present

## 2017-04-20 DIAGNOSIS — M25552 Pain in left hip: Secondary | ICD-10-CM

## 2017-04-20 DIAGNOSIS — M79672 Pain in left foot: Secondary | ICD-10-CM | POA: Diagnosis not present

## 2017-04-20 NOTE — Therapy (Signed)
[facility] [address] [place], [place], [address] Phone: [phone]   Fax:  [phone]  Physical Therapy Treatment  Patient Details  Name: Jennifer Franco MRN: [id number] Date of Birth: 03-29-1956 Referring Provider: Kelton Pillar, MD   Encounter Date: 04/20/2017  PT End of Session - 04/20/17 1512    Visit Number  9    Number of Visits  10    Date for PT Re-Evaluation  05/10/17    Authorization Type  Medicare: G-codes at 67    Authorization - Visit Number  9    Authorization - Number of Visits  10    PT Start Time  7353 Pt arrived late and requested to leave early     PT Stop Time  1523    PT Time Calculation (min)  32 min    Activity Tolerance  Patient tolerated treatment well;No increased pain    Behavior During Therapy  WFL for tasks assessed/performed       Past Medical History:  Diagnosis Date  . Sjogren's syndrome ([place])     No past surgical history on file.  There were no vitals filed for this visit.  Subjective Assessment - 04/20/17 1459    Subjective  Pt reports that things are going well. She has been using her cream and the rolling stick on her muscles since her last session. She has been having much less give way on her LLE. She is feeling good right now.     Currently in Pain?  No/denies                      [facility] Adult PT Treatment/Exercise - 04/20/17 0001      Lumbar Exercises: Supine   Clam  10 reps    Clam Limitations  green TB     Heel Slides  10 reps;Limitations    Heel Slides Limitations  x2 sets each LE      Knee/Hip Exercises: Standing   Hip Abduction  2 sets;10 reps    Abduction Limitations  standing       Manual Therapy   Soft tissue mobilization  Lt piriformis TPR             PT Education - 04/20/17 1524    Education provided  Yes    Education Details  importance of completing self trigger point release     Person(s) Educated  Patient     Methods  Explanation    Comprehension  Verbalized understanding       PT Short Term Goals - 04/10/17 1327      PT SHORT TERM GOAL #1   Title  Pt will demo consistency and independence with her HEP to increase strength and decrease pain.     Time  4    Period  Weeks    Status  Achieved      PT SHORT TERM GOAL #2   Title  Pt will demo proper log roll technique with bed mobility during her session, without cuing from the therapist, in order to decrease strain on her low back.     Time  4    Period  Weeks    Status  Achieved      PT SHORT TERM GOAL #3   Title  Pt will report atleast 25% resolution in her pain with daily activity from the start of therapy.     Time  4    Period  Weeks  Status  Achieved        PT Long Term Goals - 03/16/17 0817      PT LONG TERM GOAL #1   Title  Pt will demo improved BLE strength to 5/5 MMT which will increase her safety with stair negotiation and other activity around the house.     Time  8    Period  Weeks    Status  New    Target Date  05/10/17      PT LONG TERM GOAL #2   Title  Pt will demo improved hip extension ROM to atleast 10 deg, which will assist with glute activation during ambulation.     Time  8    Period  Weeks    Status  New      PT LONG TERM GOAL #3   Title  Pt will report atleast 75% improvement in her pain and other symptoms from the start of therapy, to increase her activity tolerance and quality of life.     Time  8    Period  Weeks      PT LONG TERM GOAL #4   Title  Pt will report atleast 50% decrease in her feelings of Lt hip instability with daily activity.    Time  8    Period  Weeks    Status  New            Plan - 04/20/17 1528    Clinical Impression Statement  Pt arrived late today and requested to leave early due to other appointments. Session focused on therex to promote hip ROM in pain free ranges and ended with trigger point release technique to the gluteal region for decreased pain and  spasm. Pt was able to complete heel slide activity without report of pain, compared to prior sessions. Therapist encouraged pt to complete full HEP including use of ball for self-trigger point massage and she verbalized understanding. Will consider dry needling next session to further address muscle spasm.    Rehab Potential  Good    PT Frequency  2x / week    PT Duration  8 weeks    PT Treatment/Interventions  ADLs/Self Care Home Management;Cryotherapy;Electrical Stimulation;Moist Heat;Stair training;Functional mobility training;Therapeutic activities;Therapeutic exercise;Patient/family education;Neuromuscular re-education;Balance training;Manual techniques;Taping;Dry needling;Passive range of motion;Traction    PT Next Visit Plan  core strength and hip flexibility progressions; hip mobilizations/soft tissue work to address anterior restrictions    PT Home Exercise Plan  added bridge, supine march with UE press     Consulted and Agree with Plan of Care  Patient       Patient will benefit from skilled therapeutic intervention in order to improve the following deficits and impairments:  Abnormal gait, Decreased activity tolerance, Decreased balance, Difficulty walking, Impaired flexibility, Obesity, Hypomobility, Decreased strength, Decreased range of motion, Decreased mobility, Increased muscle spasms, Postural dysfunction, Pain, Improper body mechanics  Visit Diagnosis: Chronic low back pain, unspecified back pain laterality, with sciatica presence unspecified  Pain in left hip  Muscle weakness (generalized)  Muscle spasm of back     Problem List There are no active problems to display for this patient.  3:29 PM,04/20/17 Elly Modena PT, DPT [place] at [place]  [facility] [address], [place], [place], [address] Phone: [phone]   Fax:  [phone]  Name: Jennifer Franco MRN: [id number] Date of Birth: May 18, 1955

## 2017-05-03 ENCOUNTER — Ambulatory Visit: Payer: BLUE CROSS/BLUE SHIELD | Admitting: Physical Therapy

## 2017-05-03 DIAGNOSIS — M62830 Muscle spasm of back: Secondary | ICD-10-CM | POA: Diagnosis not present

## 2017-05-03 DIAGNOSIS — G8929 Other chronic pain: Secondary | ICD-10-CM | POA: Diagnosis not present

## 2017-05-03 DIAGNOSIS — M25552 Pain in left hip: Secondary | ICD-10-CM | POA: Diagnosis not present

## 2017-05-03 DIAGNOSIS — M6281 Muscle weakness (generalized): Secondary | ICD-10-CM | POA: Diagnosis not present

## 2017-05-03 DIAGNOSIS — M545 Low back pain: Principal | ICD-10-CM

## 2017-05-03 NOTE — Therapy (Addendum)
[facility] [address] [place], [place], [address] Phone: [phone]   Fax:  [phone]  Physical Therapy Treatment  Patient Details  Name: Jennifer Franco MRN: [id number] Date of Birth: Oct 25, 1955 Referring Provider: Kelton Pillar, MD   Encounter Date: 05/03/2017  PT End of Session - 05/03/17 1412    Visit Number  10    Date for PT Re-Evaluation  05/10/17    Authorization Type  Medicare: G-codes at 10    PT Start Time  6659 pt arrived late    PT Stop Time  1458    PT Time Calculation (min)  48 min    Activity Tolerance  Patient tolerated treatment well    Behavior During Therapy  [facility] for tasks assessed/performed       Past Medical History:  Diagnosis Date  . Sjogren's syndrome ([place])     No past surgical history on file.  There were no vitals filed for this visit.  Subjective Assessment - 05/03/17 1412    Subjective  Pt reports she has lost another 8 pounds.  she traveled from [place], flew in today.  She did pretty well with lifting luggage and flying.  "I've felt pressure in the hip. But my back and hip has felt so much better since needling and massage".      Currently in Pain?  No/denies    Pain Score  0-No pain tightness, not pain.     Pain Orientation  Left         [facility] PT Assessment - 05/03/17 0001      Assessment   Medical Diagnosis  Chronic LBP    Referring Provider  Kelton Pillar, MD      Observation/Other Assessments   Focus on Therapeutic Outcomes (FOTO)   46% limited      Strength   Right Hip Flexion  -- 5-/5    Right Hip Extension  4+/5    Right Hip ABduction  4/5    Left Hip Flexion  3-/5    Left Hip Extension  4/5 with pain in Lt hip    Left Hip ABduction  3/5      Palpation   SI assessment   Rt sacral torsion noted in Prone; Lt iliac higher in standing, Lt ASIS higher than Rt.         [facility] Adult PT Treatment/Exercise - 05/03/17 0001      Self-Care   Self-Care   Other Self-Care Comments    Other Self-Care Comments   Pt educated on sit to/from supine via log roll with demo and instruction; pt returned demo with VC.        Lumbar Exercises: Supine   Ab Set  10 reps;5 seconds multiple cues for technique    Clam  --    Clam Limitations  --    Heel Slides  --    Bent Knee Raise  --      Knee/Hip Exercises: Aerobic   Nustep  L1: (arms_0 ) x 5 min       Modalities   Modalities  Electrical Stimulation;Moist Heat      Moist Heat Therapy   Number Minutes Moist Heat  15 Minutes    Moist Heat Location  Hip L      Electrical Stimulation   Electrical Stimulation Location  Lt hip    Electrical Stimulation Action  premod to ant/ post    Electrical Stimulation Parameters  to tolerance    Electrical Stimulation  Goals  Pain;Tone      Manual Therapy   Manual Therapy  Soft tissue mobilization;Muscle Energy Technique    Soft tissue mobilization  STM to Rt/Lt glutes, Lt QL, Lt psoas.     Muscle Energy Technique  MET to correct Rt sacral torsion (pt in prone); MET for Lt elevated ilium in Rt sidelying with contract relax of QL.               PT Education - 05/03/17 1558    Education provided  Yes    Education Details  TENS information     Person(s) Educated  Patient    Methods  Explanation;Handout    Comprehension  Verbalized understanding       PT Short Term Goals - 04/10/17 1327      PT SHORT TERM GOAL #1   Title  Pt will demo consistency and independence with her HEP to increase strength and decrease pain.     Time  4    Period  Weeks    Status  Achieved      PT SHORT TERM GOAL #2   Title  Pt will demo proper log roll technique with bed mobility during her session, without cuing from the therapist, in order to decrease strain on her low back.     Time  4    Period  Weeks    Status  Achieved      PT SHORT TERM GOAL #3   Title  Pt will report atleast 25% resolution in her pain with daily activity from the start of therapy.     Time   4    Period  Weeks    Status  Achieved        PT Long Term Goals - 05/03/17 1552      PT LONG TERM GOAL #1   Title  Pt will demo improved BLE strength to 5/5 MMT which will increase her safety with stair negotiation and other activity around the house.     Time  8    Period  Weeks    Status  Partially Met      PT LONG TERM GOAL #2   Title  Pt will demo improved hip extension ROM to atl east 10 deg, which will assist with glute activation during ambulation.     Time  8    Period  Weeks    Status  On-going      PT LONG TERM GOAL #3   Title  Pt will report atleast 75% improvement in her pain and other symptoms from the start of therapy, to increase her activity tolerance and quality of life.     Time  8    Period  Weeks    Status  On-going pt reporting 25% improvement 05/03/17      PT LONG TERM GOAL #4   Title  Pt will report atleast 50% decrease in her feelings of Lt hip instability with daily activity.    Time  8    Period  Weeks    Status  On-going            Plan - 05/03/17 1553    Clinical Impression Statement  Pt demonstrated improved Rt hip strength; Lt hip remains weak and painful with MMT.  Her FOTO score is similar to intake, improvement by 1%.  Pt had some asymmetries in pelvis; improved alignment with MET corrections.  Pt reported relief of tightness/tenderness with use of MHP/ estim at end  of session.  Pt making gradual gains toward established goals.     PT Treatment/Interventions  ADLs/Self Care Home Management;Cryotherapy;Electrical Stimulation;Moist Heat;Stair training;Functional mobility training;Therapeutic activities;Therapeutic exercise;Patient/family education;Neuromuscular re-education;Balance training;Manual techniques;Taping;Dry needling;Passive range of motion;Traction    PT Next Visit Plan  Assess readiness to d/c vs continuation of therapy.  assess response to MET/manual therapy.     Consulted and Agree with Plan of Care  Patient       Patient  will benefit from skilled therapeutic intervention in order to improve the following deficits and impairments:  Abnormal gait, Decreased activity tolerance, Decreased balance, Difficulty walking, Impaired flexibility, Obesity, Hypomobility, Decreased strength, Decreased range of motion, Decreased mobility, Increased muscle spasms, Postural dysfunction, Pain, Improper body mechanics  Visit Diagnosis: Chronic low back pain, unspecified back pain laterality, with sciatica presence unspecified  Pain in left hip  Muscle weakness (generalized)  Muscle spasm of back   G-Codes - 2017-05-05 1718    Functional Assessment Tool Used (Outpatient Only)  FOTO: clinical assessment; 46% limited     Functional Limitation  Mobility: Walking and moving around    Mobility: Walking and Moving Around Current Status [phone])  At least 40 percent but less than 60 percent impaired, limited or restricted    Mobility: Walking and Moving Around Goal Status [phone])  At least 20 percent but less than 40 percent impaired, limited or restricted       Problem List There are no active problems to display for this patient.  Kerin Perna, PTA 05/05/2017 5:19 PM  Celyn P. Helene Kelp PT, MPH 2017/05/05 5:19 PM   [facility] [address] [place], [place], [address] Phone: [phone]   Fax:  [phone]  Name: Jennifer Franco MRN: [id number] Date of Birth: [birth year]

## 2017-05-10 ENCOUNTER — Ambulatory Visit: Payer: BLUE CROSS/BLUE SHIELD | Attending: Family Medicine | Admitting: Physical Therapy

## 2017-05-10 ENCOUNTER — Encounter: Payer: Self-pay | Admitting: Physical Therapy

## 2017-05-10 DIAGNOSIS — M545 Low back pain: Secondary | ICD-10-CM | POA: Insufficient documentation

## 2017-05-10 DIAGNOSIS — M62830 Muscle spasm of back: Secondary | ICD-10-CM | POA: Insufficient documentation

## 2017-05-10 DIAGNOSIS — G8929 Other chronic pain: Secondary | ICD-10-CM

## 2017-05-10 DIAGNOSIS — M25552 Pain in left hip: Secondary | ICD-10-CM | POA: Diagnosis not present

## 2017-05-10 DIAGNOSIS — M6281 Muscle weakness (generalized): Secondary | ICD-10-CM | POA: Insufficient documentation

## 2017-05-10 NOTE — Therapy (Signed)
[facility] [address], [place], [place], [address] Phone: [phone]   Fax:  [phone]  Physical Therapy Treatment/re-evaluation  Patient Details  Name: Jennifer Franco MRN: [id number] Date of Birth: 09-04-1955 Referring Provider: Kelton Pillar, MD    Encounter Date: 05/10/2017  PT End of Session - 05/10/17 1541    Visit Number  11    Number of Visits  20    Date for PT Re-Evaluation  05/10/17    Authorization Type  Medicare: G-codes at 25    Authorization Time Period  05/11/17 to 06/22/17    Authorization - Number of Visits  10    PT Start Time  3662    PT Stop Time  1616    PT Time Calculation (min)  43 min    Activity Tolerance  Patient tolerated treatment well;No increased pain    Behavior During Therapy  WFL for tasks assessed/performed       Past Medical History:  Diagnosis Date  . Sjogren's syndrome ([place])     History reviewed. No pertinent surgical history.  There were no vitals filed for this visit.  Subjective Assessment - 05/10/17 1539    Subjective  Pt reports that she received her TENS unit in the mail and already tried using it. She did meet her weight loss goals this past year. She was able to travel without any increase in pain, and notes that this is the first time she has been able to walk pain free. She does have some pain in the Lt hip region.     Pertinent History  Sjogren's syndrome    Limitations  House hold activities    How long can you sit comfortably?  10-15 minutes     How long can you stand comfortably?  unlimited    How long can you walk comfortably?  unlimited     Patient Stated Goals  put shoes on the left leg and wipe her left leg off after the shower just like she does on the Rt     Currently in Pain?  Yes    Pain Score  5  (0/10 end of session)   Pain Location  Buttocks    Pain Orientation  Left    Pain Descriptors / Indicators  Aching    Pain Type  Chronic pain     Pain Radiating Towards  none     Pain Onset  More than a month ago    Pain Frequency  Intermittent    Aggravating Factors   alot of house activity     Pain Relieving Factors  stretching, massage          [facility] PT Assessment - 05/10/17 0001      Assessment   Medical Diagnosis  Chronic LBP    Referring Provider  Kelton Pillar, MD     Prior Therapy  2 years ago with good results       Precautions   Precautions  None      Balance Screen   Has the patient fallen in the past 6 months  No    Has the patient had a decrease in activity level because of a fear of falling?   No    Is the patient reluctant to leave their home because of a fear of falling?   No      Prior Function   Level of Independence  Independent      Observation/Other Assessments   Focus  on Therapeutic Outcomes (FOTO)   46% limited last session       Sensation   Light Touch  Appears Intact      AROM   Lumbar Flexion  flattening of lumbar curvature with stretch noted     Lumbar Extension  25% limited, x10 reps (pain end range with no change overall)       Strength   Right Hip Flexion  4/5    Right Hip Extension  5/5    Right Hip ABduction  4+/5    Left Hip Flexion  3/5 pain     Left Hip Extension  4-/5 with pain in Lt hip/buttock region    Left Hip ABduction  3/5    Right Knee Flexion  4+/5    Right Knee Extension  5/5    Left Knee Flexion  4+/5    Left Knee Extension  5/5    Right Ankle Dorsiflexion  5/5    Left Ankle Dorsiflexion  5/5      Palpation   SI assessment   tenderness with sacral compression       Special Tests    Special Tests  Hip Special Tests    Hip Special Tests   Saralyn Pilar (FABER) Test;Thomas Test;Hip Scouring      Straight Leg Raise   Findings  Negative      Saralyn Pilar (FABER) Test   Findings  Positive    Side  Left      Thomas Test    Findings  Positive    Side  Left      Hip Scouring   Findings  Negative                          PT Education -  05/10/17 1554    Education provided  Yes    Education Details  goals/progress since starting therapy; use of TENS and set up for home; importance of increasing HEP adherence moving forward.     Person(s) Educated  Patient    Methods  Explanation;Demonstration    Comprehension  Verbalized understanding       PT Short Term Goals - 05/10/17 1549      PT SHORT TERM GOAL #1   Title  Pt will demo consistency and independence with her HEP to increase strength and decrease pain.     Time  4    Period  Weeks    Status  Achieved      PT SHORT TERM GOAL #2   Title  Pt will demo proper log roll technique with bed mobility during her session, without cuing from the therapist, in order to decrease strain on her low back.     Time  4    Period  Weeks    Status  Achieved      PT SHORT TERM GOAL #3   Title  Pt will report atleast 25% resolution in her pain with daily activity from the start of therapy.     Time  4    Period  Weeks    Status  Achieved        PT Long Term Goals - 05/10/17 1549      PT LONG TERM GOAL #1   Title  Pt will demo improved BLE strength to 5/5 MMT which will increase her safety with stair negotiation and other activity around the house.     Time  6    Period  Weeks  Status  Partially Met    Target Date  06/22/17      PT LONG TERM GOAL #2   Title  Pt will demo improved hip extension ROM to atl east 10 deg, which will assist with glute activation during ambulation.     Time  8    Period  Weeks    Status  On-going      PT LONG TERM GOAL #3   Title  Pt will report atleast 75% improvement in her pain and other symptoms from the start of therapy, to increase her activity tolerance and quality of life.     Time  8    Period  Weeks    Status  On-going pt reporting 25% improvement 05/03/17      PT LONG TERM GOAL #4   Title  Pt will report atleast 50% decrease in her feelings of Lt hip instability with daily activity.    Baseline  pt reports that her hip has not  been giving out on her as often as it was originally. 30% improved    Time  8    Period  Weeks    Status  On-going      PT LONG TERM GOAL #5   Title  Pt will be able to get on/off the floor without assistance and no more than 1 UE support on her thigh, 2/3 trials, to improve her safety with daily activity.     Time  8    Period  Weeks    Status  New            Plan - 05/10/17 1619    Clinical Impression Statement  Pt has overall made progress towards her goals, demonstrating improvements in LE strength, pain and function since her evaluation. She notes that she has been able to walk without pain for the first time in about 2 years. Pt does continue to have inconsistent symptoms of Lt buttock and groin pain which does not appear to have specific causes and has been difficult to pinpoint in past sessions. Her groin pain is reproduced with resisted hip flexion and end ranges of hip extension, indicating possible hip flexor irritation. Her buttock pain is noted with resisted hip extension. Hip pathology testing appears negative at this time, although passive straight leg testing did appear to reproduce buttock symptoms as well. Pt would benefit from continued skilled PT to address her remaining limitations in hip/lumbar mobility, hip strength and overall independence with an advanced HEP in order to promote her independence with daily activity at home and in the community.     PT Treatment/Interventions  ADLs/Self Care Home Management;Cryotherapy;Electrical Stimulation;Moist Heat;Stair training;Functional mobility training;Therapeutic activities;Therapeutic exercise;Patient/family education;Neuromuscular re-education;Balance training;Manual techniques;Taping;Dry needling;Passive range of motion;Traction    PT Next Visit Plan  f/u on use of TENS unit at home; lumbar/hip mobilizations; gentle hip flexor strengthening, gluteal strengthening    Consulted and Agree with Plan of Care  Patient        Patient will benefit from skilled therapeutic intervention in order to improve the following deficits and impairments:  Abnormal gait, Decreased activity tolerance, Decreased balance, Difficulty walking, Impaired flexibility, Obesity, Hypomobility, Decreased strength, Decreased range of motion, Decreased mobility, Increased muscle spasms, Postural dysfunction, Pain, Improper body mechanics  Visit Diagnosis: Chronic low back pain, unspecified back pain laterality, with sciatica presence unspecified  Muscle weakness (generalized)  Pain in left hip  Muscle spasm of back     Problem List There are no active problems  to display for this patient.   4:26 PM,05/10/17 Elly Modena PT, [place] [facility] [place]  [facility] [address], [place], [place], [address] Phone: [phone]   Fax:  [phone]  Name: VERENIS NICOSIA MRN: [id number] Date of Birth: 17-Feb-1956

## 2017-05-15 ENCOUNTER — Ambulatory Visit: Payer: BLUE CROSS/BLUE SHIELD | Admitting: Physical Therapy

## 2017-05-15 DIAGNOSIS — M6281 Muscle weakness (generalized): Secondary | ICD-10-CM | POA: Diagnosis not present

## 2017-05-15 DIAGNOSIS — G8929 Other chronic pain: Secondary | ICD-10-CM | POA: Diagnosis not present

## 2017-05-15 DIAGNOSIS — M62830 Muscle spasm of back: Secondary | ICD-10-CM

## 2017-05-15 DIAGNOSIS — M545 Low back pain: Principal | ICD-10-CM

## 2017-05-15 DIAGNOSIS — M25552 Pain in left hip: Secondary | ICD-10-CM | POA: Diagnosis not present

## 2017-05-15 NOTE — Therapy (Signed)
[facility] [address] [place], [place], [address] Phone: [phone]   Fax:  [phone]  Physical Therapy Treatment  Patient Details  Name: Jennifer Franco MRN: [id number] Date of Birth: 1956-02-06 Referring Provider: Kelton Pillar, MD    Encounter Date: 05/15/2017  PT End of Session - 05/15/17 1458    Visit Number  12    Number of Visits  20    Date for PT Re-Evaluation  05/10/17    Authorization Type  Medicare: G-codes at 10    Authorization Time Period  05/11/17 to 06/22/17    Authorization - Number of Visits  10    PT Start Time  2641    PT Stop Time  1730    PT Time Calculation (min)  40 min    Activity Tolerance  Patient tolerated treatment well;No increased pain    Behavior During Therapy  WFL for tasks assessed/performed       Past Medical History:  Diagnosis Date  . Sjogren's syndrome ([place])     No past surgical history on file.  There were no vitals filed for this visit.  Subjective Assessment - 05/15/17 1452    Subjective  Pt reports things continue to go well. She does have occasions where her hip wanted to give out on her, but this was only maybe 2x and has been getting a little better. Her primary issue is her Lt thumb. She continues to work on her HEP regularly.     Pertinent History  Sjogren's syndrome    Limitations  House hold activities    How long can you sit comfortably?  10-15 minutes     How long can you stand comfortably?  unlimited    How long can you walk comfortably?  unlimited     Patient Stated Goals  put shoes on the left leg and wipe her left leg off after the shower just like she does on the Rt     Currently in Pain?  No/denies    Pain Onset  More than a month ago                      [facility] Adult PT Treatment/Exercise - 05/15/17 0001      Knee/Hip Exercises: Stretches   Piriformis Stretch  2 reps;Both;30 seconds;Limitations    Piriformis Stretch  Limitations  seated, increased weight shift Lt      Knee/Hip Exercises: Seated   Ball Squeeze  x15, 3 sec hold       Knee/Hip Exercises: Supine   Heel Slides  Left;AROM;1 set;10 reps    Other Supine Knee/Hip Exercises  Lt hip adduction slide x10 reps      Manual Therapy   Joint Mobilization  grade III Lt hip long axis distraction; Grade III/IV CPAs lumbar spine (pain decreased in low back), Lt lumbar gapping rotation mobilization    Soft tissue mobilization  TPR Lt QL; Lt glute max STM             PT Education - 05/15/17 1501    Education provided  Yes    Education Details  placement of tens pads    Person(s) Educated  Patient    Methods  Explanation    Comprehension  Verbalized understanding       PT Short Term Goals - 05/10/17 1549      PT SHORT TERM GOAL #1   Title  Pt will demo consistency and independence with  her HEP to increase strength and decrease pain.     Time  4    Period  Weeks    Status  Achieved      PT SHORT TERM GOAL #2   Title  Pt will demo proper log roll technique with bed mobility during her session, without cuing from the therapist, in order to decrease strain on her low back.     Time  4    Period  Weeks    Status  Achieved      PT SHORT TERM GOAL #3   Title  Pt will report atleast 25% resolution in her pain with daily activity from the start of therapy.     Time  4    Period  Weeks    Status  Achieved        PT Long Term Goals - 05/10/17 1549      PT LONG TERM GOAL #1   Title  Pt will demo improved BLE strength to 5/5 MMT which will increase her safety with stair negotiation and other activity around the house.     Time  6    Period  Weeks    Status  Partially Met    Target Date  06/22/17      PT LONG TERM GOAL #2   Title  Pt will demo improved hip extension ROM to atl east 10 deg, which will assist with glute activation during ambulation.     Time  8    Period  Weeks    Status  On-going      PT LONG TERM GOAL #3   Title  Pt  will report atleast 75% improvement in her pain and other symptoms from the start of therapy, to increase her activity tolerance and quality of life.     Time  8    Period  Weeks    Status  On-going pt reporting 25% improvement 05/03/17      PT LONG TERM GOAL #4   Title  Pt will report atleast 50% decrease in her feelings of Lt hip instability with daily activity.    Baseline  pt reports that her hip has not been giving out on her as often as it was originally. 30% improved    Time  8    Period  Weeks    Status  On-going      PT LONG TERM GOAL #5   Title  Pt will be able to get on/off the floor without assistance and no more than 1 UE support on her thigh, 2/3 trials, to improve her safety with daily activity.     Time  8    Period  Weeks    Status  New            Plan - 05/15/17 1531    Clinical Impression Statement  Pt arrived without report of buttock pain since her last session. Continued with focus on hip strengthening, adding more manual treatment to address hip/lumbar mobility restrictions. Pt did reports some increase in pain with end ranges of hip flexion/extension which was resolved by the end of today's session. PT continues to educate her on proper set up and use of TENS at home. Will continue with current POC.    PT Treatment/Interventions  ADLs/Self Care Home Management;Cryotherapy;Electrical Stimulation;Moist Heat;Stair training;Functional mobility training;Therapeutic activities;Therapeutic exercise;Patient/family education;Neuromuscular re-education;Balance training;Manual techniques;Taping;Dry needling;Passive range of motion;Traction    PT Next Visit Plan  f/u on use of TENS unit at home;  lumbar/hip mobilizations; gentle hip flexor strengthening, gluteal strengthening    Consulted and Agree with Plan of Care  Patient       Patient will benefit from skilled therapeutic intervention in order to improve the following deficits and impairments:  Abnormal gait, Decreased  activity tolerance, Decreased balance, Difficulty walking, Impaired flexibility, Obesity, Hypomobility, Decreased strength, Decreased range of motion, Decreased mobility, Increased muscle spasms, Postural dysfunction, Pain, Improper body mechanics  Visit Diagnosis: Chronic low back pain, unspecified back pain laterality, with sciatica presence unspecified  Muscle weakness (generalized)  Pain in left hip  Muscle spasm of back     Problem List There are no active problems to display for this patient.   3:38 PM,05/15/17 [place], [place] [facility] [place]  [place] [address], [place], [place], [address] Phone: [phone]   Fax:  [phone]  Name: EULALIA ELLERMAN MRN: [id number] Date of Birth: March 30, 1956

## 2017-05-18 ENCOUNTER — Ambulatory Visit: Payer: BLUE CROSS/BLUE SHIELD | Admitting: Physical Therapy

## 2017-05-18 ENCOUNTER — Encounter: Payer: Self-pay | Admitting: Physical Therapy

## 2017-05-18 DIAGNOSIS — M25552 Pain in left hip: Secondary | ICD-10-CM | POA: Diagnosis not present

## 2017-05-18 DIAGNOSIS — M62830 Muscle spasm of back: Secondary | ICD-10-CM

## 2017-05-18 DIAGNOSIS — G8929 Other chronic pain: Secondary | ICD-10-CM

## 2017-05-18 DIAGNOSIS — M6281 Muscle weakness (generalized): Secondary | ICD-10-CM

## 2017-05-18 DIAGNOSIS — M545 Low back pain: Secondary | ICD-10-CM | POA: Diagnosis not present

## 2017-05-18 NOTE — Therapy (Signed)
[facility] [address], [place], [place], [address] Phone: [phone]   Fax:  [phone]  Physical Therapy Treatment  Patient Details  Name: Jennifer Franco MRN: [id number] Date of Birth: [birth year] Referring Provider: Kelton Pillar, MD    Encounter Date: 05/18/2017  PT End of Session - 05/18/17 1310    Visit Number  13    Number of Visits  20    Date for PT Re-Evaluation  06/22/17    Authorization Type  Medicare: G-codes at 13    Authorization Time Period  05/11/17 to 06/22/17    Authorization - Number of Visits  10    PT Start Time  3716    PT Stop Time  9678    PT Time Calculation (min)  39 min    Activity Tolerance  Patient tolerated treatment well;No increased pain    Behavior During Therapy  WFL for tasks assessed/performed       Past Medical History:  Diagnosis Date  . Sjogren's syndrome ([place])     History reviewed. No pertinent surgical history.  There were no vitals filed for this visit.  Subjective Assessment - 05/18/17 1237    Subjective  Pt reports that she feels better. Getting out of the shower and drying off her left leg is much easier than it used to be. She is interested in possibly having therapy on her hands.     Pertinent History  Sjogren's syndrome    Limitations  House hold activities    How long can you sit comfortably?  10-15 minutes     How long can you stand comfortably?  unlimited    How long can you walk comfortably?  unlimited     Patient Stated Goals  put shoes on the left leg and wipe her left leg off after the shower just like she does on the Rt     Currently in Pain?  No/denies    Pain Onset  More than a month ago                      [facility] Adult PT Treatment/Exercise - 05/18/17 0001      Lumbar Exercises: Supine   Other Supine Lumbar Exercises  low trunk rotation Lt/Rt x10 reps each       Knee/Hip Exercises: Standing   Other Standing Knee  Exercises  single leg stance with contralateral LE tap on 2nd step, 2 finger support x15 reps each side, another set on LLE x10 reps with cues to decrease trunk shifting      Knee/Hip Exercises: Supine   Heel Slides  Both;Limitations;5 reps;3 sets    Heel Slides Limitations  with BUE pressdown     Other Supine Knee/Hip Exercises  hip abduction/adduction slide 2x10 reps              PT Education - 05/18/17 1313    Education provided  Yes    Education Details  gait deviations resulting from areas of weakness and hip flexibility limitation    Person(s) Educated  Patient    Methods  Explanation    Comprehension  Verbalized understanding       PT Short Term Goals - 05/10/17 1549      PT SHORT TERM GOAL #1   Title  Pt will demo consistency and independence with her HEP to increase strength and decrease pain.     Time  4    Period  Weeks  Status  Achieved      PT SHORT TERM GOAL #2   Title  Pt will demo proper log roll technique with bed mobility during her session, without cuing from the therapist, in order to decrease strain on her low back.     Time  4    Period  Weeks    Status  Achieved      PT SHORT TERM GOAL #3   Title  Pt will report atleast 25% resolution in her pain with daily activity from the start of therapy.     Time  4    Period  Weeks    Status  Achieved        PT Long Term Goals - 05/10/17 1549      PT LONG TERM GOAL #1   Title  Pt will demo improved BLE strength to 5/5 MMT which will increase her safety with stair negotiation and other activity around the house.     Time  6    Period  Weeks    Status  Partially Met    Target Date  06/22/17      PT LONG TERM GOAL #2   Title  Pt will demo improved hip extension ROM to atl east 10 deg, which will assist with glute activation during ambulation.     Time  8    Period  Weeks    Status  On-going      PT LONG TERM GOAL #3   Title  Pt will report atleast 75% improvement in her pain and other symptoms  from the start of therapy, to increase her activity tolerance and quality of life.     Time  8    Period  Weeks    Status  On-going pt reporting 25% improvement 05/03/17      PT LONG TERM GOAL #4   Title  Pt will report atleast 50% decrease in her feelings of Lt hip instability with daily activity.    Baseline  pt reports that her hip has not been giving out on her as often as it was originally. 30% improved    Time  8    Period  Weeks    Status  On-going      PT LONG TERM GOAL #5   Title  Pt will be able to get on/off the floor without assistance and no more than 1 UE support on her thigh, 2/3 trials, to improve her safety with daily activity.     Time  8    Period  Weeks    Status  New            Plan - 05/18/17 1319    Clinical Impression Statement  Continued this session with therex to promote hip strength and core stability. Pt was able to perform hip flexion and abduction this session with 50% improvement from previous sessions, noting increased completion of reps/resistance. Attempted to educate pt on proper gait mechanics and completed activity to encourage increased glute med activation, however she was unable to successfully complete without significant assistance/cuing. Will continue to reinforce this in future sessions.     PT Treatment/Interventions  ADLs/Self Care Home Management;Cryotherapy;Electrical Stimulation;Moist Heat;Stair training;Functional mobility training;Therapeutic activities;Therapeutic exercise;Patient/family education;Neuromuscular re-education;Balance training;Manual techniques;Taping;Dry needling;Passive range of motion;Traction    PT Next Visit Plan  lumbar/hip mobilizations; gentle hip flexor/abductor strengthening, gluteal strengthening (trial closed chain)    Consulted and Agree with Plan of Care  Patient       Patient  will benefit from skilled therapeutic intervention in order to improve the following deficits and impairments:  Abnormal gait,  Decreased activity tolerance, Decreased balance, Difficulty walking, Impaired flexibility, Obesity, Hypomobility, Decreased strength, Decreased range of motion, Decreased mobility, Increased muscle spasms, Postural dysfunction, Pain, Improper body mechanics  Visit Diagnosis: Chronic low back pain, unspecified back pain laterality, with sciatica presence unspecified  Muscle weakness (generalized)  Pain in left hip  Muscle spasm of back     Problem List There are no active problems to display for this patient.   1:29 PM,05/18/17 Elly Modena PT, DPT [place] [facility] [address] [place], [place], [address] Phone: [phone]   Fax:  [phone]  Name: Jennifer Franco MRN: [id number] Date of Birth: 1955/09/19

## 2017-05-22 ENCOUNTER — Encounter: Payer: Medicare Other | Admitting: Physical Therapy

## 2017-05-25 ENCOUNTER — Encounter: Payer: Self-pay | Admitting: Physical Therapy

## 2017-05-25 ENCOUNTER — Ambulatory Visit: Payer: BLUE CROSS/BLUE SHIELD | Admitting: Physical Therapy

## 2017-05-25 DIAGNOSIS — M545 Low back pain: Secondary | ICD-10-CM | POA: Diagnosis not present

## 2017-05-25 DIAGNOSIS — M6281 Muscle weakness (generalized): Secondary | ICD-10-CM

## 2017-05-25 DIAGNOSIS — M25552 Pain in left hip: Secondary | ICD-10-CM

## 2017-05-25 DIAGNOSIS — G8929 Other chronic pain: Secondary | ICD-10-CM | POA: Diagnosis not present

## 2017-05-25 DIAGNOSIS — M62830 Muscle spasm of back: Secondary | ICD-10-CM | POA: Diagnosis not present

## 2017-05-25 NOTE — Patient Instructions (Signed)
   WALL SQUATS  Leaning up against a wall or closed door on your back, slide your body downward and then return back to upright position.  A door was used here because it was smoother and had less friction than the wall.   Knees should bend in line with the 2nd toe. x15 reps.    [facility] [address] [place], [place] [address] Phone # [phone] Fax [phone]

## 2017-05-25 NOTE — Therapy (Signed)
[facility] [address] [place], [place], [address] Phone: [phone]   Fax:  [phone]  Physical Therapy Treatment  Patient Details  Name: Jennifer Franco MRN: [id number] Date of Birth: July 16, 1955 Referring Provider: Kelton Pillar, MD    Encounter Date: 05/25/2017  PT End of Session - 05/25/17 1252    Visit Number  14    Number of Visits  20    Date for PT Re-Evaluation  06/22/17    Authorization Type  Medicare: G-codes at 71    Authorization Time Period  05/11/17 to 06/22/17    Authorization - Number of Visits  10    PT Start Time  5277    PT Stop Time  1315    PT Time Calculation (min)  42 min    Activity Tolerance  Patient tolerated treatment well;No increased pain    Behavior During Therapy  WFL for tasks assessed/performed       Past Medical History:  Diagnosis Date  . Sjogren's syndrome ([place])     History reviewed. No pertinent surgical history.  There were no vitals filed for this visit.  Subjective Assessment - 05/25/17 1236    Subjective  Pt reports that she was doing well following her last session, but on Monday night she had some random aching/pain in her Lt buttock/low back area. She completed her exercises regardless and took some pain medication which seemed to gradually improve her pain.     Pertinent History  Sjogren's syndrome    Limitations  House hold activities    How long can you sit comfortably?  10-15 minutes     How long can you stand comfortably?  unlimited    How long can you walk comfortably?  unlimited     Patient Stated Goals  put shoes on the left leg and wipe her left leg off after the shower just like she does on the Rt     Currently in Pain?  No/denies    Pain Onset  More than a month ago                      [facility] Adult PT Treatment/Exercise - 05/25/17 0001      Lumbar Exercises: Standing   Wall Slides  15 reps;Limitations    Wall Slides  Limitations  x2 sets, 2nd set pt was encouraged to increase depth of squat     Other Standing Lumbar Exercises  ipsilateral LE/UE flexion (supported at wall) 2x10 reps       Knee/Hip Exercises: Standing   Hip Abduction  Left;2 sets;10 reps    Hip Extension  2 sets;10 reps;Left      Manual Therapy   Joint Mobilization  Grade III inferior/lateral mobilization Lt hip; lumbar CPAs grade III-IV    Soft tissue mobilization  TPR Lt glute max/piriformis              PT Education - 05/25/17 1258    Education provided  Yes    Education Details  techinque with therex     Person(s) Educated  Patient    Methods  Explanation;Verbal cues    Comprehension  Verbalized understanding;Returned demonstration       PT Short Term Goals - 05/10/17 1549      PT SHORT TERM GOAL #1   Title  Pt will demo consistency and independence with her HEP to increase strength and decrease pain.     Time  4  Period  Weeks    Status  Achieved      PT SHORT TERM GOAL #2   Title  Pt will demo proper log roll technique with bed mobility during her session, without cuing from the therapist, in order to decrease strain on her low back.     Time  4    Period  Weeks    Status  Achieved      PT SHORT TERM GOAL #3   Title  Pt will report atleast 25% resolution in her pain with daily activity from the start of therapy.     Time  4    Period  Weeks    Status  Achieved        PT Long Term Goals - 05/10/17 1549      PT LONG TERM GOAL #1   Title  Pt will demo improved BLE strength to 5/5 MMT which will increase her safety with stair negotiation and other activity around the house.     Time  6    Period  Weeks    Status  Partially Met    Target Date  06/22/17      PT LONG TERM GOAL #2   Title  Pt will demo improved hip extension ROM to atl east 10 deg, which will assist with glute activation during ambulation.     Time  8    Period  Weeks    Status  On-going      PT LONG TERM GOAL #3   Title  Pt will  report atleast 75% improvement in her pain and other symptoms from the start of therapy, to increase her activity tolerance and quality of life.     Time  8    Period  Weeks    Status  On-going pt reporting 25% improvement 05/03/17      PT LONG TERM GOAL #4   Title  Pt will report atleast 50% decrease in her feelings of Lt hip instability with daily activity.    Baseline  pt reports that her hip has not been giving out on her as often as it was originally. 30% improved    Time  8    Period  Weeks    Status  On-going      PT LONG TERM GOAL #5   Title  Pt will be able to get on/off the floor without assistance and no more than 1 UE support on her thigh, 2/3 trials, to improve her safety with daily activity.     Time  8    Period  Weeks    Status  New            Plan - 05/25/17 1319    Clinical Impression Statement  Pt arrived reporting improved ability to change into her pants at home. She continues to take more responsibility with her HEP at home. Session focused on therex to encourage functional LE strength in combination with manual techniques to hip/lumbar region. Pt with decreased spasm of the Lt glutes overall, and responded well end of session feeling as if she could walk better. Made an addition to her HEP and pt demonstrated understanding at this time.    PT Treatment/Interventions  ADLs/Self Care Home Management;Cryotherapy;Electrical Stimulation;Moist Heat;Stair training;Functional mobility training;Therapeutic activities;Therapeutic exercise;Patient/family education;Neuromuscular re-education;Balance training;Manual techniques;Taping;Dry needling;Passive range of motion;Traction    PT Next Visit Plan  lumbar mobilizations; gentle hip 4 way strengthening (closed chain primarily)    PT Home Exercise Plan  wall  squats     Consulted and Agree with Plan of Care  Patient       Patient will benefit from skilled therapeutic intervention in order to improve the following deficits  and impairments:  Abnormal gait, Decreased activity tolerance, Decreased balance, Difficulty walking, Impaired flexibility, Obesity, Hypomobility, Decreased strength, Decreased range of motion, Decreased mobility, Increased muscle spasms, Postural dysfunction, Pain, Improper body mechanics  Visit Diagnosis: Chronic low back pain, unspecified back pain laterality, with sciatica presence unspecified  Muscle weakness (generalized)  Pain in left hip  Muscle spasm of back     Problem List There are no active problems to display for this patient.   1:24 PM,05/25/17 Sherol Dade PT, DPT [place] [facility] [place]  [facility] [address], [place], [place], [address] Phone: [phone]   Fax:  [phone]  Name: Jennifer Franco MRN: [id number] Date of Birth: 10/03/1955

## 2017-05-30 ENCOUNTER — Ambulatory Visit: Payer: BLUE CROSS/BLUE SHIELD | Admitting: Physical Therapy

## 2017-05-30 ENCOUNTER — Encounter: Payer: Self-pay | Admitting: Physical Therapy

## 2017-05-30 DIAGNOSIS — M62830 Muscle spasm of back: Secondary | ICD-10-CM

## 2017-05-30 DIAGNOSIS — M25552 Pain in left hip: Secondary | ICD-10-CM | POA: Diagnosis not present

## 2017-05-30 DIAGNOSIS — M545 Low back pain: Principal | ICD-10-CM

## 2017-05-30 DIAGNOSIS — G8929 Other chronic pain: Secondary | ICD-10-CM | POA: Diagnosis not present

## 2017-05-30 DIAGNOSIS — M6281 Muscle weakness (generalized): Secondary | ICD-10-CM | POA: Diagnosis not present

## 2017-05-30 NOTE — Therapy (Signed)
[facility] [address], [place], [place], [address] Phone: [phone]   Fax:  [phone]  Physical Therapy Treatment  Patient Details  Name: Jennifer Franco MRN: [id number] Date of Birth: December 17, 1955 Referring Provider: Kelton Pillar, MD    Encounter Date: 05/30/2017  PT End of Session - 05/30/17 1619    Visit Number  15    Number of Visits  20    Date for PT Re-Evaluation  06/22/17    Authorization Type  Medicare: G-codes at 60    Authorization Time Period  05/11/17 to 06/22/17    Authorization - Number of Visits  10    PT Start Time  1536    PT Stop Time  6962    PT Time Calculation (min)  39 min    Activity Tolerance  Patient tolerated treatment well;No increased pain    Behavior During Therapy  WFL for tasks assessed/performed       Past Medical History:  Diagnosis Date  . Sjogren's syndrome ([place])     History reviewed. No pertinent surgical history.  There were no vitals filed for this visit.  Subjective Assessment - 05/30/17 1627    Subjective  Pt reports things are going well. No pain currently and she has been working on her wall squats which were easier at home for some reason.     Pertinent History  Sjogren's syndrome    Limitations  House hold activities    How long can you sit comfortably?  10-15 minutes     How long can you stand comfortably?  unlimited    How long can you walk comfortably?  unlimited     Patient Stated Goals  put shoes on the left leg and wipe her left leg off after the shower just like she does on the Rt     Currently in Pain?  No/denies    Pain Onset  More than a month ago                      [facility] Adult PT Treatment/Exercise - 05/30/17 0001      Lumbar Exercises: Seated   Other Seated Lumbar Exercises  seated on red physioball with UBE L1 x67mn forward/backward      Lumbar Exercises: Supine   Other Supine Lumbar Exercises  Lt hip flexion with LE  on red physioball 2x10 reps       Knee/Hip Exercises: Machines for Strengthening   Cybex Leg Press  BLE seat 7, 2x10 reps with 100#      Knee/Hip Exercises: Standing   Hip Extension  2 sets;10 reps    Extension Limitations  propped on forearms     Other Standing Knee Exercises  hip abduction/adduction slide x10 reps each, completed a second set with yellow TB resistance eccentric control x10 reps each              PT Education - 05/30/17 1618    Education provided  No       PT Short Term Goals - 05/10/17 1549      PT SHORT TERM GOAL #1   Title  Pt will demo consistency and independence with her HEP to increase strength and decrease pain.     Time  4    Period  Weeks    Status  Achieved      PT SHORT TERM GOAL #2   Title  Pt will demo proper log roll technique with  bed mobility during her session, without cuing from the therapist, in order to decrease strain on her low back.     Time  4    Period  Weeks    Status  Achieved      PT SHORT TERM GOAL #3   Title  Pt will report atleast 25% resolution in her pain with daily activity from the start of therapy.     Time  4    Period  Weeks    Status  Achieved        PT Long Term Goals - 05/10/17 1549      PT LONG TERM GOAL #1   Title  Pt will demo improved BLE strength to 5/5 MMT which will increase her safety with stair negotiation and other activity around the house.     Time  6    Period  Weeks    Status  Partially Met    Target Date  06/22/17      PT LONG TERM GOAL #2   Title  Pt will demo improved hip extension ROM to atl east 10 deg, which will assist with glute activation during ambulation.     Time  8    Period  Weeks    Status  On-going      PT LONG TERM GOAL #3   Title  Pt will report atleast 75% improvement in her pain and other symptoms from the start of therapy, to increase her activity tolerance and quality of life.     Time  8    Period  Weeks    Status  On-going pt reporting 25% improvement  05/03/17      PT LONG TERM GOAL #4   Title  Pt will report atleast 50% decrease in her feelings of Lt hip instability with daily activity.    Baseline  pt reports that her hip has not been giving out on her as often as it was originally. 30% improved    Time  8    Period  Weeks    Status  On-going      PT LONG TERM GOAL #5   Title  Pt will be able to get on/off the floor without assistance and no more than 1 UE support on her thigh, 2/3 trials, to improve her safety with daily activity.     Time  8    Period  Weeks    Status  New            Plan - 05/30/17 1619    Clinical Impression Statement  Pt arrived with no specific reports of pain and overall feeling that things are improving. Sessions focused on progressing LE strengthening exercises, which pt was able to complete with intermittent cuing for technique. Pt continues to require encouragement with exercises and therapist often has to educate her on differences between muscle soreness and pain. Ended session without any reports of increased buttock/low back pain. Will continue with current POC.     PT Treatment/Interventions  ADLs/Self Care Home Management;Cryotherapy;Electrical Stimulation;Moist Heat;Stair training;Functional mobility training;Therapeutic activities;Therapeutic exercise;Patient/family education;Neuromuscular re-education;Balance training;Manual techniques;Taping;Dry needling;Passive range of motion;Traction    PT Next Visit Plan  closed chain strengthening; lumbar strengthening    PT Home Exercise Plan  wall squats     Consulted and Agree with Plan of Care  Patient       Patient will benefit from skilled therapeutic intervention in order to improve the following deficits and impairments:  Abnormal gait, Decreased activity  tolerance, Decreased balance, Difficulty walking, Impaired flexibility, Obesity, Hypomobility, Decreased strength, Decreased range of motion, Decreased mobility, Increased muscle spasms, Postural  dysfunction, Pain, Improper body mechanics  Visit Diagnosis: Chronic low back pain, unspecified back pain laterality, with sciatica presence unspecified  Muscle weakness (generalized)  Pain in left hip  Muscle spasm of back     Problem List There are no active problems to display for this patient.   4:28 PM,05/30/17 Sherol Dade PT, DPT [place] [facility] [place]  [facility] [address] [place], [place], [address] Phone: [phone]   Fax:  [phone]  Name: AVEENA BARI MRN: [id number] Date of Birth: [birth year]

## 2017-06-01 ENCOUNTER — Ambulatory Visit: Payer: BLUE CROSS/BLUE SHIELD | Admitting: Physical Therapy

## 2017-06-01 DIAGNOSIS — M62830 Muscle spasm of back: Secondary | ICD-10-CM | POA: Diagnosis not present

## 2017-06-01 DIAGNOSIS — M545 Low back pain: Secondary | ICD-10-CM | POA: Diagnosis not present

## 2017-06-01 DIAGNOSIS — M25552 Pain in left hip: Secondary | ICD-10-CM | POA: Diagnosis not present

## 2017-06-01 DIAGNOSIS — G8929 Other chronic pain: Secondary | ICD-10-CM | POA: Diagnosis not present

## 2017-06-01 DIAGNOSIS — M6281 Muscle weakness (generalized): Secondary | ICD-10-CM | POA: Diagnosis not present

## 2017-06-01 NOTE — Therapy (Signed)
[facility] [address] [place], [place], [address] Phone: [phone]   Fax:  [phone]  Physical Therapy Treatment  Patient Details  Name: Jennifer Franco MRN: [id number] Date of Birth: 1955/07/19 Referring Provider: Kelton Pillar, MD    Encounter Date: 06/01/2017  PT End of Session - 06/01/17 1309    Visit Number  16    Number of Visits  20    Date for PT Re-Evaluation  06/22/17    Authorization Type  Medicare: G-codes at 50    Authorization Time Period  05/11/17 to 06/22/17    Authorization - Number of Visits  10    PT Start Time  1243 pt arrived at wrong time    PT Stop Time  1322    PT Time Calculation (min)  39 min    Activity Tolerance  Patient tolerated treatment well;No increased pain    Behavior During Therapy  WFL for tasks assessed/performed       Past Medical History:  Diagnosis Date  . Sjogren's syndrome ([place])     No past surgical history on file.  There were no vitals filed for this visit.  Subjective Assessment - 06/01/17 1244    Subjective  Pt reports that things weren't too bad after her last session. No complaints currently.     Pertinent History  Sjogren's syndrome    Limitations  House hold activities    How long can you sit comfortably?  10-15 minutes     How long can you stand comfortably?  unlimited    How long can you walk comfortably?  unlimited     Patient Stated Goals  put shoes on the left leg and wipe her left leg off after the shower just like she does on the Rt     Currently in Pain?  No/denies    Pain Onset  More than a month ago              [facility] Adult PT Treatment/Exercise - 06/01/17 0001      Lumbar Exercises: Supine   Other Supine Lumbar Exercises  Lt hip flexion with LE on red physioball 2x10 reps     Other Supine Lumbar Exercises  Lt bent knee fallout x15 reps       Knee/Hip Exercises: Standing   Heel Raises  Both;2 sets;10 reps;Limitations    Heel Raises Limitations  single leg     Other Standing Knee Exercises  hip abduction/adduction slide 2x10 reps each, cuing to prevent hip ER      Knee/Hip Exercises: Sidelying   Hip ABduction  1 set;Both;10 reps             PT Education - 06/01/17 1335    Education provided  Yes    Education Details  how end of POC works in Nurse, adult. to determine the need for skilled PT    Person(s) Educated  Patient    Methods  Explanation    Comprehension  Verbalized understanding       PT Short Term Goals - 06/01/17 1310      PT SHORT TERM GOAL #1   Title  Pt will demo consistency and independence with her HEP to increase strength and decrease pain.     Time  4    Period  Weeks    Status  Achieved      PT SHORT TERM GOAL #2   Title  Pt will demo proper log roll technique with  bed mobility during her session, without cuing from the therapist, in order to decrease strain on her low back.     Time  4    Period  Weeks    Status  Achieved      PT SHORT TERM GOAL #3   Title  Pt will report atleast 25% resolution in her pain with daily activity from the start of therapy.     Time  4    Period  Weeks    Status  Achieved        PT Long Term Goals - 06/01/17 1310      PT LONG TERM GOAL #1   Title  Pt will demo improved BLE strength to 5/5 MMT which will increase her safety with stair negotiation and other activity around the house.     Time  6    Period  Weeks    Status  Partially Met      PT LONG TERM GOAL #2   Title  Pt will demo improved hip extension ROM to atl east 10 deg, which will assist with glute activation during ambulation.     Time  8    Period  Weeks    Status  On-going      PT LONG TERM GOAL #3   Title  Pt will report atleast 75% improvement in her pain and other symptoms from the start of therapy, to increase her activity tolerance and quality of life.     Time  8    Period  Weeks    Status  On-going pt reporting 25% improvement 05/03/17       PT LONG TERM GOAL #4   Title  Pt will report atleast 50% decrease in her feelings of Lt hip instability with daily activity.    Baseline  pt reports that her hip has not been giving out on her as often as it was originally. 30% improved    Time  8    Period  Weeks    Status  On-going      PT LONG TERM GOAL #5   Title  Pt will be able to get on/off the floor without assistance and no more than 1 UE support on her thigh, 2/3 trials, to improve her safety with daily activity.     Time  8    Period  Weeks    Status  On-going            Plan - 06/01/17 1319    Clinical Impression Statement  Pt continues to report low levels of pain upon arrival to her sessions. She was able to complete therex this session with some difficulty noted during B hip abduction strengthening in particular. Pt continues to note that her pain and walking is significantly improved, however when asked to give a percentage of improvement she is unable to do so. Will continue to progress LE strength and flexibility for improved safety and independence with daily activity.     PT Treatment/Interventions  ADLs/Self Care Home Management;Cryotherapy;Electrical Stimulation;Moist Heat;Stair training;Functional mobility training;Therapeutic activities;Therapeutic exercise;Patient/family education;Neuromuscular re-education;Balance training;Manual techniques;Taping;Dry needling;Passive range of motion;Traction    PT Next Visit Plan  closed chain strengthening: sit to stand trial, hip extension/side stepping; lumbar strengthening    PT Home Exercise Plan  wall squats     Consulted and Agree with Plan of Care  Patient       Patient will benefit from skilled therapeutic intervention in order to improve the following deficits and  impairments:  Abnormal gait, Decreased activity tolerance, Decreased balance, Difficulty walking, Impaired flexibility, Obesity, Hypomobility, Decreased strength, Decreased range of motion, Decreased  mobility, Increased muscle spasms, Postural dysfunction, Pain, Improper body mechanics  Visit Diagnosis: Chronic low back pain, unspecified back pain laterality, with sciatica presence unspecified  Muscle weakness (generalized)  Pain in left hip  Muscle spasm of back     Problem List There are no active problems to display for this patient.  1:36 PM,06/01/17 Sherol Dade PT, DPT [place] [facility] [place]  [facility] [address] [place], [place], [address] Phone: [phone]   Fax:  [phone]  Name: ANNAI HEICK MRN: [id number] Date of Birth: 1956/01/03

## 2017-06-05 ENCOUNTER — Ambulatory Visit: Payer: BLUE CROSS/BLUE SHIELD | Admitting: Physical Therapy

## 2017-06-05 ENCOUNTER — Encounter: Payer: Self-pay | Admitting: Physical Therapy

## 2017-06-05 DIAGNOSIS — M25552 Pain in left hip: Secondary | ICD-10-CM

## 2017-06-05 DIAGNOSIS — G8929 Other chronic pain: Secondary | ICD-10-CM

## 2017-06-05 DIAGNOSIS — M545 Low back pain: Principal | ICD-10-CM

## 2017-06-05 DIAGNOSIS — M6281 Muscle weakness (generalized): Secondary | ICD-10-CM | POA: Diagnosis not present

## 2017-06-05 DIAGNOSIS — M62830 Muscle spasm of back: Secondary | ICD-10-CM | POA: Diagnosis not present

## 2017-06-05 NOTE — Therapy (Signed)
[facility] [address] [place], [place], [address] Phone: [phone]   Fax:  [phone]  Physical Therapy Treatment  Patient Details  Name: Jennifer Franco MRN: [id number] Date of Birth: [birth year] Referring Provider: Kelton Pillar, MD    Encounter Date: 06/05/2017  PT End of Session - 06/05/17 1238    Visit Number  17    Number of Visits  20    Date for PT Re-Evaluation  06/22/17    Authorization Type  Medicare: G-codes at 9    Authorization Time Period  05/11/17 to 06/22/17    Authorization - Number of Visits  10    PT Start Time  5465    PT Stop Time  1312    PT Time Calculation (min)  38 min    Activity Tolerance  Patient tolerated treatment well;No increased pain    Behavior During Therapy  WFL for tasks assessed/performed       Past Medical History:  Diagnosis Date  . Sjogren's syndrome ([place])     History reviewed. No pertinent surgical history.  There were no vitals filed for this visit.  Subjective Assessment - 06/05/17 1237    Subjective  Pt reports that she wasn't feeling too good last night. She completed her exercises up until Saturday when she started feeling bad.     Pertinent History  Sjogren's syndrome    Limitations  House hold activities    How long can you sit comfortably?  10-15 minutes     How long can you stand comfortably?  unlimited    How long can you walk comfortably?  unlimited     Patient Stated Goals  put shoes on the left leg and wipe her left leg off after the shower just like she does on the Rt     Currently in Pain?  No/denies    Pain Onset  More than a month ago                      [facility] Adult PT Treatment/Exercise - 06/05/17 0001      Lumbar Exercises: Supine   Other Supine Lumbar Exercises  Lt hip active flexion, LE propped on bolster x10 reps       Knee/Hip Exercises: Machines for Strengthening   Cybex Leg Press  BLE seat 8, x20 reps; LLE  only 2x10 reps with 50# (seat 7)      Knee/Hip Exercises: Standing   Wall Squat  1 set;10 reps    Other Standing Knee Exercises  Lt hip 3 way on floor sliders x10 reps with end range extension hold x10 sec       Knee/Hip Exercises: Supine   Other Supine Knee/Hip Exercises  B knees to chest stretch x15 reps with LE on red physioball and pt providing self over pressure             PT Education - 06/05/17 1305    Education provided  Yes    Education Details  importance of progressing LE strength for carry over with tasks such as getting up off the floor    Person(s) Educated  Patient    Methods  Explanation    Comprehension  Verbalized understanding       PT Short Term Goals - 06/01/17 1310      PT SHORT TERM GOAL #1   Title  Pt will demo consistency and independence with her HEP to increase strength and decrease pain.  Time  4    Period  Weeks    Status  Achieved      PT SHORT TERM GOAL #2   Title  Pt will demo proper log roll technique with bed mobility during her session, without cuing from the therapist, in order to decrease strain on her low back.     Time  4    Period  Weeks    Status  Achieved      PT SHORT TERM GOAL #3   Title  Pt will report atleast 25% resolution in her pain with daily activity from the start of therapy.     Time  4    Period  Weeks    Status  Achieved        PT Long Term Goals - 06/01/17 1310      PT LONG TERM GOAL #1   Title  Pt will demo improved BLE strength to 5/5 MMT which will increase her safety with stair negotiation and other activity around the house.     Time  6    Period  Weeks    Status  Partially Met      PT LONG TERM GOAL #2   Title  Pt will demo improved hip extension ROM to atl east 10 deg, which will assist with glute activation during ambulation.     Time  8    Period  Weeks    Status  On-going      PT LONG TERM GOAL #3   Title  Pt will report atleast 75% improvement in her pain and other symptoms from the  start of therapy, to increase her activity tolerance and quality of life.     Time  8    Period  Weeks    Status  On-going pt reporting 25% improvement 05/03/17      PT LONG TERM GOAL #4   Title  Pt will report atleast 50% decrease in her feelings of Lt hip instability with daily activity.    Baseline  pt reports that her hip has not been giving out on her as often as it was originally. 30% improved    Time  8    Period  Weeks    Status  On-going      PT LONG TERM GOAL #5   Title  Pt will be able to get on/off the floor without assistance and no more than 1 UE support on her thigh, 2/3 trials, to improve her safety with daily activity.     Time  8    Period  Weeks    Status  On-going            Plan - 06/05/17 1249    Clinical Impression Statement  Pt is slowly making progress towards improved mobility of the LLE. She continues to walk more upright into her sessions and during today's session she was able to actively lift her LLE onto the phsyioball with minimal assistance compared to previous sessions. She does still require encouragement to complete increased reps and resistance with therex, and despite this she has no reports of increase in pain following her sessions. Introduced single leg press this session to prepare for activity getting up off of the floor.     PT Treatment/Interventions  ADLs/Self Care Home Management;Cryotherapy;Electrical Stimulation;Moist Heat;Stair training;Functional mobility training;Therapeutic activities;Therapeutic exercise;Patient/family education;Neuromuscular re-education;Balance training;Manual techniques;Taping;Dry needling;Passive range of motion;Traction    PT Next Visit Plan  closed chain strengthening/ progress single leg press: sit to  stand trial, hip extension/side stepping; lumbar strengthening    PT Home Exercise Plan  wall squats     Consulted and Agree with Plan of Care  Patient       Patient will benefit from skilled therapeutic  intervention in order to improve the following deficits and impairments:  Abnormal gait, Decreased activity tolerance, Decreased balance, Difficulty walking, Impaired flexibility, Obesity, Hypomobility, Decreased strength, Decreased range of motion, Decreased mobility, Increased muscle spasms, Postural dysfunction, Pain, Improper body mechanics  Visit Diagnosis: Chronic low back pain, unspecified back pain laterality, with sciatica presence unspecified  Muscle weakness (generalized)  Pain in left hip  Muscle spasm of back     Problem List There are no active problems to display for this patient.   2:00 PM,06/05/17 [place], [place] [facility] [place]  [facility] [address] [place], [place], [address] Phone: [phone]   Fax:  [phone]  Name: ROVENA HEARLD MRN: [id number] Date of Birth: Apr 07, 1956

## 2017-06-08 ENCOUNTER — Encounter: Payer: Self-pay | Admitting: Physical Therapy

## 2017-06-08 ENCOUNTER — Ambulatory Visit: Payer: BLUE CROSS/BLUE SHIELD | Admitting: Physical Therapy

## 2017-06-08 DIAGNOSIS — G8929 Other chronic pain: Secondary | ICD-10-CM | POA: Diagnosis not present

## 2017-06-08 DIAGNOSIS — M6281 Muscle weakness (generalized): Secondary | ICD-10-CM

## 2017-06-08 DIAGNOSIS — M545 Low back pain: Secondary | ICD-10-CM | POA: Diagnosis not present

## 2017-06-08 DIAGNOSIS — M25552 Pain in left hip: Secondary | ICD-10-CM | POA: Diagnosis not present

## 2017-06-08 DIAGNOSIS — M62830 Muscle spasm of back: Secondary | ICD-10-CM

## 2017-06-08 NOTE — Therapy (Signed)
[facility] [address], [place], [place], [address] Phone: [phone]   Fax:  [phone]  Physical Therapy Treatment  Patient Details  Name: Jennifer Franco MRN: [id number] Date of Birth: 1955/07/08 Referring Provider: Kelton Pillar, MD    Encounter Date: 06/08/2017  PT End of Session - 06/08/17 1304    Visit Number  18    Number of Visits  20    Date for PT Re-Evaluation  06/22/17    Authorization Type  Medicare: G-codes at 96    Authorization Time Period  05/11/17 to 06/22/17    Authorization - Number of Visits  10    PT Start Time  1232    PT Stop Time  1312    PT Time Calculation (min)  40 min    Activity Tolerance  Patient tolerated treatment well;No increased pain    Behavior During Therapy  WFL for tasks assessed/performed       Past Medical History:  Diagnosis Date  . Sjogren's syndrome ([place])     History reviewed. No pertinent surgical history.  There were no vitals filed for this visit.  Subjective Assessment - 06/08/17 1238    Subjective  Pt reports she was able to complete her wall squats since last session. No mention of pain upon arrival, she just feels weak.     Pertinent History  Sjogren's syndrome    Limitations  House hold activities    How long can you sit comfortably?  10-15 minutes     How long can you stand comfortably?  unlimited    How long can you walk comfortably?  unlimited     Patient Stated Goals  put shoes on the left leg and wipe her left leg off after the shower just like she does on the Rt     Currently in Pain?  No/denies                      [facility] Adult PT Treatment/Exercise - 06/08/17 0001      Knee/Hip Exercises: Aerobic   Nustep  L3, x5 min PT present to encourage maintained SPM above 70      Knee/Hip Exercises: Machines for Strengthening   Cybex Leg Press  BLE seat 8, x20 reps at 110#, 2nd set increased to 130# x15      Knee/Hip Exercises:  Standing   Heel Raises  Both;1 set    Heel Raises Limitations  x25 reps     Hip Abduction  2 sets;Stengthening;10 reps    Abduction Limitations  2# ankle weight     Hip Extension  Both;2 sets;10 reps;Knee straight;Other (comment);Stengthening    Extension Limitations  2nd set with 2# ankle weight              PT Education - 06/08/17 1303    Education provided  Yes    Education Details  technique with therex     Person(s) Educated  Patient    Methods  Explanation;Verbal cues    Comprehension  Verbalized understanding       PT Short Term Goals - 06/01/17 1310      PT SHORT TERM GOAL #1   Title  Pt will demo consistency and independence with her HEP to increase strength and decrease pain.     Time  4    Period  Weeks    Status  Achieved      PT SHORT TERM GOAL #2  Title  Pt will demo proper log roll technique with bed mobility during her session, without cuing from the therapist, in order to decrease strain on her low back.     Time  4    Period  Weeks    Status  Achieved      PT SHORT TERM GOAL #3   Title  Pt will report atleast 25% resolution in her pain with daily activity from the start of therapy.     Time  4    Period  Weeks    Status  Achieved        PT Long Term Goals - 06/01/17 1310      PT LONG TERM GOAL #1   Title  Pt will demo improved BLE strength to 5/5 MMT which will increase her safety with stair negotiation and other activity around the house.     Time  6    Period  Weeks    Status  Partially Met      PT LONG TERM GOAL #2   Title  Pt will demo improved hip extension ROM to atl east 10 deg, which will assist with glute activation during ambulation.     Time  8    Period  Weeks    Status  On-going      PT LONG TERM GOAL #3   Title  Pt will report atleast 75% improvement in her pain and other symptoms from the start of therapy, to increase her activity tolerance and quality of life.     Time  8    Period  Weeks    Status  On-going pt  reporting 25% improvement 05/03/17      PT LONG TERM GOAL #4   Title  Pt will report atleast 50% decrease in her feelings of Lt hip instability with daily activity.    Baseline  pt reports that her hip has not been giving out on her as often as it was originally. 30% improved    Time  8    Period  Weeks    Status  On-going      PT LONG TERM GOAL #5   Title  Pt will be able to get on/off the floor without assistance and no more than 1 UE support on her thigh, 2/3 trials, to improve her safety with daily activity.     Time  8    Period  Weeks    Status  On-going            Plan - 06/08/17 1310    Clinical Impression Statement  Pt continues to progress LE strengthening during her sessions. She was able to add 2# weight to standing hip extension and abduction without report of Lt glute/hip pain. She does continue to ambulate with decreased hip extension likely due to remaining limitations in hip mobility and strength, however this is improving overall. Will continue with current POC.    PT Treatment/Interventions  ADLs/Self Care Home Management;Cryotherapy;Electrical Stimulation;Moist Heat;Stair training;Functional mobility training;Therapeutic activities;Therapeutic exercise;Patient/family education;Neuromuscular re-education;Balance training;Manual techniques;Taping;Dry needling;Passive range of motion;Traction    PT Next Visit Plan  closed chain strengthening/progress single leg press: sit to stand trial, hip extension/side stepping; lumbar strengthening    PT Home Exercise Plan  wall squats     Consulted and Agree with Plan of Care  Patient       Patient will benefit from skilled therapeutic intervention in order to improve the following deficits and impairments:  Abnormal gait, Decreased activity  tolerance, Decreased balance, Difficulty walking, Impaired flexibility, Obesity, Hypomobility, Decreased strength, Decreased range of motion, Decreased mobility, Increased muscle spasms,  Postural dysfunction, Pain, Improper body mechanics  Visit Diagnosis: Chronic low back pain, unspecified back pain laterality, with sciatica presence unspecified  Muscle weakness (generalized)  Pain in left hip  Muscle spasm of back     Problem List There are no active problems to display for this patient.   1:17 PM,06/08/17 Sherol Dade PT, [place] at [place]  [facility] [address] [place], [place], [address] Phone: [phone]   Fax:  [phone]  Name: MAILIN COGLIANESE MRN: [id number] Date of Birth: 10-14-1955

## 2017-06-12 ENCOUNTER — Encounter: Payer: Self-pay | Admitting: Physical Therapy

## 2017-06-12 ENCOUNTER — Ambulatory Visit: Payer: BLUE CROSS/BLUE SHIELD | Attending: Family Medicine | Admitting: Physical Therapy

## 2017-06-12 DIAGNOSIS — G8929 Other chronic pain: Secondary | ICD-10-CM | POA: Diagnosis not present

## 2017-06-12 DIAGNOSIS — M545 Low back pain: Secondary | ICD-10-CM | POA: Diagnosis not present

## 2017-06-12 DIAGNOSIS — M25552 Pain in left hip: Secondary | ICD-10-CM | POA: Insufficient documentation

## 2017-06-12 DIAGNOSIS — M6281 Muscle weakness (generalized): Secondary | ICD-10-CM

## 2017-06-12 DIAGNOSIS — M62830 Muscle spasm of back: Secondary | ICD-10-CM | POA: Insufficient documentation

## 2017-06-12 NOTE — Therapy (Signed)
[facility] [address] [place], [place], [address] Phone: [phone]   Fax:  [phone]  Physical Therapy Treatment  Patient Details  Name: Jennifer Franco MRN: [id number] Date of Birth: 11/01/1955 Referring Provider: Kelton Pillar, MD    Encounter Date: 06/12/2017  PT End of Session - 06/12/17 1305    Visit Number  19    Number of Visits  20    Date for PT Re-Evaluation  06/22/17    Authorization Type  Medicare: G-codes at 86    Authorization Time Period  05/11/17 to 06/22/17    Authorization - Number of Visits  10    PT Start Time  8563    PT Stop Time  1310    PT Time Calculation (min)  39 min    Activity Tolerance  Patient tolerated treatment well;No increased pain    Behavior During Therapy  WFL for tasks assessed/performed       Past Medical History:  Diagnosis Date  . Sjogren's syndrome ([place])     History reviewed. No pertinent surgical history.  There were no vitals filed for this visit.  Subjective Assessment - 06/12/17 1233    Subjective  Pt reports that she had some increased pain in her low back on Thursday and Friday. She did alot of moving and remodeling following that session which involved a lot of lifting. She has no pain currently, and notes that by Saturday she wasn't having any pain.     Pertinent History  Sjogren's syndrome    Limitations  House hold activities    How long can you sit comfortably?  10-15 minutes     How long can you stand comfortably?  unlimited    How long can you walk comfortably?  unlimited     Patient Stated Goals  put shoes on the left leg and wipe her left leg off after the shower just like she does on the Rt     Currently in Pain?  No/denies                      [facility] Adult PT Treatment/Exercise - 06/12/17 0001      Self-Care   Self-Care  Lifting    Other Self-Care Comments   lifting mechanics regardless of lifting light or heavy objects        Knee/Hip Exercises: Aerobic   Nustep  intervals from L2 to L5 every 1 minute PT present to discuss lifting and changes in activity      Knee/Hip Exercises: Standing   Forward Step Up  1 set;15 reps;Hand Hold: 1;Step Height: 6";Both;Limitations    Forward Step Up Limitations  contralateral hip flexion to promote hip extension on stance LE    Other Standing Knee Exercises  Lt hip floor slider 3 ways x10 reps, 1 UE support       Knee/Hip Exercises: Seated   Sit to Sand  2 sets;10 reps;without UE support mirror feedback, RLE forward             PT Education - 06/12/17 1304    Education provided  Yes    Education Details  impact lifting mechanics can have on low back and placing extra strain on the area    [employer]) Educated  Patient    Methods  Explanation;Demonstration    Comprehension  Verbalized understanding       PT Short Term Goals - 06/01/17 1310      PT  SHORT TERM GOAL #1   Title  Pt will demo consistency and independence with her HEP to increase strength and decrease pain.     Time  4    Period  Weeks    Status  Achieved      PT SHORT TERM GOAL #2   Title  Pt will demo proper log roll technique with bed mobility during her session, without cuing from the therapist, in order to decrease strain on her low back.     Time  4    Period  Weeks    Status  Achieved      PT SHORT TERM GOAL #3   Title  Pt will report atleast 25% resolution in her pain with daily activity from the start of therapy.     Time  4    Period  Weeks    Status  Achieved        PT Long Term Goals - 06/01/17 1310      PT LONG TERM GOAL #1   Title  Pt will demo improved BLE strength to 5/5 MMT which will increase her safety with stair negotiation and other activity around the house.     Time  6    Period  Weeks    Status  Partially Met      PT LONG TERM GOAL #2   Title  Pt will demo improved hip extension ROM to atl east 10 deg, which will assist with glute activation during  ambulation.     Time  8    Period  Weeks    Status  On-going      PT LONG TERM GOAL #3   Title  Pt will report atleast 75% improvement in her pain and other symptoms from the start of therapy, to increase her activity tolerance and quality of life.     Time  8    Period  Weeks    Status  On-going pt reporting 25% improvement 05/03/17      PT LONG TERM GOAL #4   Title  Pt will report atleast 50% decrease in her feelings of Lt hip instability with daily activity.    Baseline  pt reports that her hip has not been giving out on her as often as it was originally. 30% improved    Time  8    Period  Weeks    Status  On-going      PT LONG TERM GOAL #5   Title  Pt will be able to get on/off the floor without assistance and no more than 1 UE support on her thigh, 2/3 trials, to improve her safety with daily activity.     Time  8    Period  Weeks    Status  On-going            Plan - 06/12/17 1314    Clinical Impression Statement  Pt arrived with report of pain the evening following her last session. She demonstrated good understanding of HEP/modality use to manage her pain and notes that her pain was resolved by the end of the weekend. Session focused initially on discussing lifting mechanics to decrease strain on the low back. Also continued with gentle LE strengthening. Pt reported no increase in pain end of session, however she did note LLE fatigue.     PT Treatment/Interventions  ADLs/Self Care Home Management;Cryotherapy;Electrical Stimulation;Moist Heat;Stair training;Functional mobility training;Therapeutic activities;Therapeutic exercise;Patient/family education;Neuromuscular re-education;Balance training;Manual techniques;Taping;Dry needling;Passive range of motion;Traction    PT  Next Visit Plan  trial bent knee fallout again; closed chain strengthening/progress single leg press: sit to stand trial, hip extension/side stepping; lumbar strengthening    PT Home Exercise Plan  wall  squats     Consulted and Agree with Plan of Care  Patient       Patient will benefit from skilled therapeutic intervention in order to improve the following deficits and impairments:  Abnormal gait, Decreased activity tolerance, Decreased balance, Difficulty walking, Impaired flexibility, Obesity, Hypomobility, Decreased strength, Decreased range of motion, Decreased mobility, Increased muscle spasms, Postural dysfunction, Pain, Improper body mechanics  Visit Diagnosis: Chronic low back pain, unspecified back pain laterality, with sciatica presence unspecified  Muscle weakness (generalized)  Pain in left hip  Muscle spasm of back     Problem List There are no active problems to display for this patient.   1:16 PM,06/12/17 [place], [place] at [place]  [facility] [address], [place], [place], [address] Phone: [phone]   Fax:  [phone]  Name: Jennifer Franco MRN: [id number] Date of Birth: 1956-03-28

## 2017-06-15 ENCOUNTER — Encounter: Payer: Self-pay | Admitting: Physical Therapy

## 2017-06-15 ENCOUNTER — Ambulatory Visit: Payer: BLUE CROSS/BLUE SHIELD | Admitting: Physical Therapy

## 2017-06-15 DIAGNOSIS — M62830 Muscle spasm of back: Secondary | ICD-10-CM

## 2017-06-15 DIAGNOSIS — M545 Low back pain: Secondary | ICD-10-CM | POA: Diagnosis not present

## 2017-06-15 DIAGNOSIS — M25552 Pain in left hip: Secondary | ICD-10-CM

## 2017-06-15 DIAGNOSIS — M6281 Muscle weakness (generalized): Secondary | ICD-10-CM | POA: Diagnosis not present

## 2017-06-15 DIAGNOSIS — G8929 Other chronic pain: Secondary | ICD-10-CM

## 2017-06-15 NOTE — Therapy (Signed)
[facility] [address] [place], [place], [address] Phone: [phone]   Fax:  [phone]  Physical Therapy Treatment  Patient Details  Name: Jennifer Franco MRN: [id number] Date of Birth: Sep 25, 1955 Referring Provider: Kelton Pillar, MD    Encounter Date: 06/15/2017  PT End of Session - 06/15/17 1301    Visit Number  20    Number of Visits  20    Date for PT Re-Evaluation  06/22/17    Authorization Type  Medicare: G-codes at 10    Authorization Time Period  05/11/17 to 06/22/17    Authorization - Number of Visits  10    PT Start Time  7893    PT Stop Time  1315    PT Time Calculation (min)  40 min    Activity Tolerance  Patient tolerated treatment well;No increased pain    Behavior During Therapy  WFL for tasks assessed/performed       Past Medical History:  Diagnosis Date  . Sjogren's syndrome ([place])     History reviewed. No pertinent surgical history.  There were no vitals filed for this visit.  Subjective Assessment - 06/15/17 1244    Subjective  Pt reports that things are going well. Her back continues to improve with use of her TENS unit at home. No other complaints at this time.     Pertinent History  Sjogren's syndrome    Limitations  House hold activities    How long can you sit comfortably?  10-15 minutes     How long can you stand comfortably?  unlimited    How long can you walk comfortably?  unlimited     Patient Stated Goals  put shoes on the left leg and wipe her left leg off after the shower just like she does on the Rt     Currently in Pain?  No/denies                      [facility] Adult PT Treatment/Exercise - 06/15/17 0001      Lumbar Exercises: Stretches   Piriformis Stretch  2 reps;30 seconds;Other (comment) Pt unwilling to complete on Lt      Lumbar Exercises: Supine   Clam  15 reps    Clam Limitations  x1 set with green TB     Other Supine Lumbar Exercises  Lt  bent knee fallout 2x5 reps each       Knee/Hip Exercises: Seated   Sit to Sand  1 set;10 reps;without UE support;Other (comment) RLE forward to encourage weight on Lt       Knee/Hip Exercises: Supine   Bridges  2 sets;10 reps green TB around knees              PT Education - 06/15/17 1259    Education provided  Yes    Education Details  anatomy of the hip and surrounding musculature that can contribute to soreness/discomfort with certain movements     Person(s) Educated  Patient    Methods  Explanation    Comprehension  Verbalized understanding       PT Short Term Goals - 06/01/17 1310      PT SHORT TERM GOAL #1   Title  Pt will demo consistency and independence with her HEP to increase strength and decrease pain.     Time  4    Period  Weeks    Status  Achieved  PT SHORT TERM GOAL #2   Title  Pt will demo proper log roll technique with bed mobility during her session, without cuing from the therapist, in order to decrease strain on her low back.     Time  4    Period  Weeks    Status  Achieved      PT SHORT TERM GOAL #3   Title  Pt will report atleast 25% resolution in her pain with daily activity from the start of therapy.     Time  4    Period  Weeks    Status  Achieved        PT Long Term Goals - 06/01/17 1310      PT LONG TERM GOAL #1   Title  Pt will demo improved BLE strength to 5/5 MMT which will increase her safety with stair negotiation and other activity around the house.     Time  6    Period  Weeks    Status  Partially Met      PT LONG TERM GOAL #2   Title  Pt will demo improved hip extension ROM to atl east 10 deg, which will assist with glute activation during ambulation.     Time  8    Period  Weeks    Status  On-going      PT LONG TERM GOAL #3   Title  Pt will report atleast 75% improvement in her pain and other symptoms from the start of therapy, to increase her activity tolerance and quality of life.     Time  8    Period  Weeks     Status  On-going pt reporting 25% improvement 05/03/17      PT LONG TERM GOAL #4   Title  Pt will report atleast 50% decrease in her feelings of Lt hip instability with daily activity.    Baseline  pt reports that her hip has not been giving out on her as often as it was originally. 30% improved    Time  8    Period  Weeks    Status  On-going      PT LONG TERM GOAL #5   Title  Pt will be able to get on/off the floor without assistance and no more than 1 UE support on her thigh, 2/3 trials, to improve her safety with daily activity.     Time  8    Period  Weeks    Status  On-going            Plan - 06/15/17 1318    Clinical Impression Statement  Pt reports improved acute low back pain since last session. She continues to have difficulty with hip adduction motions, requiring heavy encouragement to complete several of today's exercises. Therapist educated pt on the anatomy of the hip and reassured her of several concerns she had with this. Ended session with increased irritation of the groin, but no noted changes in gait or mobility.     PT Treatment/Interventions  ADLs/Self Care Home Management;Cryotherapy;Electrical Stimulation;Moist Heat;Stair training;Functional mobility training;Therapeutic activities;Therapeutic exercise;Patient/family education;Neuromuscular re-education;Balance training;Manual techniques;Taping;Dry needling;Passive range of motion;Traction    PT Next Visit Plan  standing active hip adduction; closed chain strengthening/progress single leg press: sit to stand trial, hip extension/side stepping; lumbar strengthening    PT Home Exercise Plan  wall squats     Consulted and Agree with Plan of Care  Patient       Patient will benefit  from skilled therapeutic intervention in order to improve the following deficits and impairments:  Abnormal gait, Decreased activity tolerance, Decreased balance, Difficulty walking, Impaired flexibility, Obesity, Hypomobility, Decreased  strength, Decreased range of motion, Decreased mobility, Increased muscle spasms, Postural dysfunction, Pain, Improper body mechanics  Visit Diagnosis: Chronic low back pain, unspecified back pain laterality, with sciatica presence unspecified  Muscle weakness (generalized)  Pain in left hip  Muscle spasm of back     Problem List There are no active problems to display for this patient.   1:21 PM,06/15/17 Sherol Dade PT, DPT [place] [facility] [place] [address] [place], [place], [address] Phone: [phone]   Fax:  [phone]  Name: Jennifer Franco MRN: [id number] Date of Birth: October 20, 1955

## 2017-06-16 DIAGNOSIS — M79672 Pain in left foot: Secondary | ICD-10-CM | POA: Diagnosis not present

## 2017-06-16 DIAGNOSIS — M79671 Pain in right foot: Secondary | ICD-10-CM | POA: Diagnosis not present

## 2017-06-19 ENCOUNTER — Ambulatory Visit: Payer: BLUE CROSS/BLUE SHIELD | Admitting: Physical Therapy

## 2017-06-19 ENCOUNTER — Encounter: Payer: Self-pay | Admitting: Physical Therapy

## 2017-06-19 DIAGNOSIS — M6281 Muscle weakness (generalized): Secondary | ICD-10-CM | POA: Diagnosis not present

## 2017-06-19 DIAGNOSIS — M545 Low back pain: Secondary | ICD-10-CM | POA: Diagnosis not present

## 2017-06-19 DIAGNOSIS — G8929 Other chronic pain: Secondary | ICD-10-CM

## 2017-06-19 DIAGNOSIS — M62830 Muscle spasm of back: Secondary | ICD-10-CM | POA: Diagnosis not present

## 2017-06-19 DIAGNOSIS — M25552 Pain in left hip: Secondary | ICD-10-CM

## 2017-06-19 NOTE — Therapy (Signed)
[facility] [address], [place], [place], [address] Phone: [phone]   Fax:  [phone]  Physical Therapy Treatment  Patient Details  Name: Jennifer Franco MRN: [id number] Date of Birth: 1956-03-02 Referring Provider: Kelton Pillar, MD    Encounter Date: 06/19/2017  PT End of Session - 06/19/17 1255    Visit Number  21    Date for PT Re-Evaluation  06/22/17    Authorization Type  Medicare: G-codes at 89    Authorization Time Period  05/11/17 to 06/22/17    Authorization - Number of Visits  10    PT Start Time  5462    PT Stop Time  1314    PT Time Calculation (min)  41 min    Activity Tolerance  Patient tolerated treatment well;No increased pain    Behavior During Therapy  WFL for tasks assessed/performed       Past Medical History:  Diagnosis Date  . Sjogren's syndrome ([place])     History reviewed. No pertinent surgical history.  There were no vitals filed for this visit.  Subjective Assessment - 06/19/17 1242    Subjective  Pt reports that over the weekend she had some increased pain in both of her hips. Her left groin aggravates her now, and she describes it as "gripey".     Pertinent History  Sjogren's syndrome    Limitations  House hold activities    How long can you sit comfortably?  10-15 minutes     How long can you stand comfortably?  unlimited    How long can you walk comfortably?  unlimited     Patient Stated Goals  put shoes on the left leg and wipe her left leg off after the shower just like she does on the Rt     Currently in Pain?  Yes    Pain Score  6  4/10 end of session    Pain Location  Groin    Pain Orientation  Left    Pain Descriptors / Indicators  Aching    Pain Type  Chronic pain    Pain Radiating Towards  none     Pain Onset  More than a month ago    Pain Frequency  Intermittent    Aggravating Factors   unsure     Pain Relieving Factors  unsure     Effect of Pain on  Daily Activities  issues with exercises                       [facility] Adult PT Treatment/Exercise - 06/19/17 0001      Lumbar Exercises: Supine   Clam  10 reps    Clam Limitations  x2 sets with green TB      Knee/Hip Exercises: Aerobic   Nustep  L2 x6 min, PT present to discuss weekend changes in pain and rest of session      Knee/Hip Exercises: Standing   Other Standing Knee Exercises  sidestepping along countertop with yellow TB around feet x3 trials      Knee/Hip Exercises: Supine   Bridges  2 sets;10 reps    Bridges with [employer]  1 set;10 reps      Knee/Hip Exercises: Sidelying   Hip ABduction  Left;1 set;10 reps      Manual Therapy   Manual therapy comments  Lt hip pain resolved end of manual treatment    Joint Mobilization  Lt hip grade  III long axis distraction x5 bouts              PT Education - 06/19/17 1255    Education provided  Yes    Education Details  upcoming re-evaluation to determine progress towards goals and need for d/c vs. continued PT    Person(s) Educated  Patient    Methods  Explanation;Handout    Comprehension  Verbalized understanding;Returned demonstration       PT Short Term Goals - 06/01/17 1310      PT SHORT TERM GOAL #1   Title  Pt will demo consistency and independence with her HEP to increase strength and decrease pain.     Time  4    Period  Weeks    Status  Achieved      PT SHORT TERM GOAL #2   Title  Pt will demo proper log roll technique with bed mobility during her session, without cuing from the therapist, in order to decrease strain on her low back.     Time  4    Period  Weeks    Status  Achieved      PT SHORT TERM GOAL #3   Title  Pt will report atleast 25% resolution in her pain with daily activity from the start of therapy.     Time  4    Period  Weeks    Status  Achieved        PT Long Term Goals - 06/01/17 1310      PT LONG TERM GOAL #1   Title  Pt will demo improved BLE strength to  5/5 MMT which will increase her safety with stair negotiation and other activity around the house.     Time  6    Period  Weeks    Status  Partially Met      PT LONG TERM GOAL #2   Title  Pt will demo improved hip extension ROM to atl east 10 deg, which will assist with glute activation during ambulation.     Time  8    Period  Weeks    Status  On-going      PT LONG TERM GOAL #3   Title  Pt will report atleast 75% improvement in her pain and other symptoms from the start of therapy, to increase her activity tolerance and quality of life.     Time  8    Period  Weeks    Status  On-going pt reporting 25% improvement 05/03/17      PT LONG TERM GOAL #4   Title  Pt will report atleast 50% decrease in her feelings of Lt hip instability with daily activity.    Baseline  pt reports that her hip has not been giving out on her as often as it was originally. 30% improved    Time  8    Period  Weeks    Status  On-going      PT LONG TERM GOAL #5   Title  Pt will be able to get on/off the floor without assistance and no more than 1 UE support on her thigh, 2/3 trials, to improve her safety with daily activity.     Time  8    Period  Weeks    Status  On-going            Plan - 06/19/17 1339    Clinical Impression Statement  Pt continues to report Lt and occasional Rt hip pain,  increased this weekend. Pt responded well to long axis distraction techniques completed at the beginning of the session, reporting resolution of her pain during and immediately after this portion of the session. Ended with therex to further promote hip strength and mobility, with pt noting 4/10 pain overall end of session. Pt's re-evaluation is coming up next session to determine if further skilled PT is needed.    PT Treatment/Interventions  ADLs/Self Care Home Management;Cryotherapy;Electrical Stimulation;Moist Heat;Stair training;Functional mobility training;Therapeutic activities;Therapeutic exercise;Patient/family  education;Neuromuscular re-education;Balance training;Manual techniques;Taping;Dry needling;Passive range of motion;Traction    PT Next Visit Plan  re-eval and possible d/c if minimal progress towards goals     PT Home Exercise Plan  wall squats     Consulted and Agree with Plan of Care  Patient       Patient will benefit from skilled therapeutic intervention in order to improve the following deficits and impairments:  Abnormal gait, Decreased activity tolerance, Decreased balance, Difficulty walking, Impaired flexibility, Obesity, Hypomobility, Decreased strength, Decreased range of motion, Decreased mobility, Increased muscle spasms, Postural dysfunction, Pain, Improper body mechanics  Visit Diagnosis: Chronic low back pain, unspecified back pain laterality, with sciatica presence unspecified  Muscle weakness (generalized)  Pain in left hip  Muscle spasm of back     Problem List There are no active problems to display for this patient.   1:44 PM,06/19/17 Sherol Dade PT, [place] at [place]  [facility] [address] [place], [place], [address] Phone: [phone]   Fax:  [phone]  Name: Jennifer Franco MRN: [id number] Date of Birth: 03-Dec-1955

## 2017-06-22 ENCOUNTER — Ambulatory Visit: Payer: BLUE CROSS/BLUE SHIELD | Admitting: Physical Therapy

## 2017-06-27 ENCOUNTER — Encounter: Payer: Self-pay | Admitting: Physical Therapy

## 2017-06-27 ENCOUNTER — Ambulatory Visit: Payer: BLUE CROSS/BLUE SHIELD | Admitting: Physical Therapy

## 2017-06-27 DIAGNOSIS — M62830 Muscle spasm of back: Secondary | ICD-10-CM | POA: Diagnosis not present

## 2017-06-27 DIAGNOSIS — G8929 Other chronic pain: Secondary | ICD-10-CM | POA: Diagnosis not present

## 2017-06-27 DIAGNOSIS — M545 Low back pain: Principal | ICD-10-CM

## 2017-06-27 DIAGNOSIS — M6281 Muscle weakness (generalized): Secondary | ICD-10-CM

## 2017-06-27 DIAGNOSIS — M25552 Pain in left hip: Secondary | ICD-10-CM

## 2017-06-27 NOTE — Therapy (Signed)
[facility] [address] [place], [place], [address] Phone: [phone]   Fax:  [phone]  Physical Therapy Treatment/Discharge  Patient Details  Name: Jennifer Franco MRN: [id number] Date of Birth: 03-Mar-1956 Referring Provider: Kelton Pillar, MD    Encounter Date: 06/27/2017  PT End of Session - 06/27/17 1238    Visit Number  22    Date for PT Re-Evaluation  06/22/17    Authorization Type  Medicare: G-codes at 76    Authorization Time Period  05/11/17 to 06/22/17    Authorization - Number of Visits  10    PT Start Time  6222    PT Stop Time  1316    PT Time Calculation (min)  42 min    Activity Tolerance  Patient tolerated treatment well;No increased pain    Behavior During Therapy  WFL for tasks assessed/performed       Past Medical History:  Diagnosis Date  . Sjogren's syndrome ([place])     History reviewed. No pertinent surgical history.  There were no vitals filed for this visit.  Subjective Assessment - 06/27/17 1235    Subjective  Pt reports that she had to cancel her last session due to a death in the family. She had alot of pain lately and wasn't sure what caused it, maybe all of the sitting and driving she had to do. She has to finish some projects at the house, installing a new backsplash and everything. She wasn't have much issue with this, but now she has more pain.     Pertinent History  Sjogren's syndrome    Limitations  House hold activities    How long can you sit comfortably?  unlimited if in a comfortable chair    How long can you stand comfortably?  unlimited    How long can you walk comfortably?  unlimited     Patient Stated Goals  put shoes on the left leg and wipe her left leg off after the shower just like she does on the Rt     Pain Onset  More than a month ago         [facility] PT Assessment - 06/27/17 0001      Assessment   Medical Diagnosis  Chronic LBP    Referring  Provider  Kelton Pillar, MD     Prior Therapy  2 years ago with good results       Precautions   Precautions  None      Balance Screen   Has the patient fallen in the past 6 months  No    Has the patient had a decrease in activity level because of a fear of falling?   No    Is the patient reluctant to leave their home because of a fear of falling?   No      Prior Function   Level of Independence  Independent      Observation/Other Assessments   Focus on Therapeutic Outcomes (FOTO)   46% limited no change      Sensation   Light Touch  Appears Intact      AROM   Overall AROM Comments  Lt hip PROM: flexion 90 deg, IR 0 deg     Lumbar Flexion  flattening of lumbar curvature with stretch noted     Lumbar Extension  25% limited, x10 reps (pain end range with no change overall)       Strength   Right Hip  Flexion  5/5    Right Hip Extension  5/5    Right Hip ABduction  3+/5    Left Hip Flexion  3/5 pain     Left Hip Extension  4-/5 pain "in the joint"/ groin    Left Hip ABduction  3/5    Right Knee Flexion  4+/5    Right Knee Extension  5/5    Left Knee Flexion  4+/5    Left Knee Extension  5/5    Right Ankle Dorsiflexion  5/5    Left Ankle Dorsiflexion  5/5      Palpation   SI assessment   --      Special Tests    Special Tests  Hip Special Tests    Hip Special Tests   Saralyn Pilar (FABER) Test;Thomas Test;Hip Scouring      Straight Leg Raise   Findings  Negative      Saralyn Pilar (FABER) Test   Findings  Positive    Side  Left      Thomas Test    Findings  Positive    Side  Left      Hip Scouring   Findings  Positive    Side  Left                          PT Education - 06/27/17 1258    Education provided  Yes    Education Details  plateau in progress; psychosocial aspect of pain and confidence with activity; importance of taking initiative to complete HEP more consistently on her own and taking a break from PT to encourage this independence;  benefits of aquatic exercise     Person(s) Educated  Patient    Methods  Explanation    Comprehension  Verbalized understanding       PT Short Term Goals - 06/27/17 1252      PT SHORT TERM GOAL #1   Title  Pt will demo consistency and independence with her HEP to increase strength and decrease pain.     Time  4    Period  Weeks    Status  Achieved      PT SHORT TERM GOAL #2   Title  Pt will demo proper log roll technique with bed mobility during her session, without cuing from the therapist, in order to decrease strain on her low back.     Time  4    Period  Weeks    Status  Achieved      PT SHORT TERM GOAL #3   Title  Pt will report atleast 25% resolution in her pain with daily activity from the start of therapy.     Time  4    Period  Weeks    Status  Achieved        PT Long Term Goals - 06/27/17 1252      PT LONG TERM GOAL #1   Title  Pt will demo improved BLE strength to 5/5 MMT which will increase her safety with stair negotiation and other activity around the house.     Baseline  Rt hip flexion has improved    Time  6    Period  Weeks    Status  Not Met      PT LONG TERM GOAL #2   Title  Pt will demo improved hip extension ROM to atl east 10 deg, which will assist with glute activation during ambulation.  Baseline  lacking 5 deg on the Lt     Time  8    Period  Weeks    Status  Not Met      PT LONG TERM GOAL #3   Title  Pt will report atleast 75% improvement in her pain and other symptoms from the start of therapy, to increase her activity tolerance and quality of life.     Baseline  30% reduction in pain     Time  8    Period  Weeks    Status  Not Met pt reporting 25% improvement 05/03/17      PT LONG TERM GOAL #4   Title  Pt will report atleast 50% decrease in her feelings of Lt hip instability with daily activity.    Baseline  50% improved     Time  8    Period  Weeks    Status  Achieved      PT LONG TERM GOAL #5   Title  Pt will be able to get  on/off the floor without assistance and no more than 1 UE support on her thigh, 2/3 trials, to improve her safety with daily activity.     Baseline  Pt reports being able to get up with UE support on something stable    Time  8    Period  Weeks    Status  Partially Met            Plan - 06/27/17 1315    Clinical Impression Statement  Pt was discharged this visit having met 50% of her short and long term goals since beginning PT a couple of months ago. She feels overall that her pain is decreased 30% and her stability and confidence with walking is 50% improved. She does demonstrate improvements in Rt hip flexor strength and she has recently finished a remodeling job in her house which she was able to complete with some assistance from her family. Pt was initially fairly consistent with her HEP, however recently she has been less compliant. This is likely contributing to her plateau in progress over the past several weeks. In addition, pt's pain and her limitations are somewhat inconsistent, evident by her report of difficulty carrying a gallon milk but also being able to repeatedly go up/down her flight of stairs for minutes at a time as exercise. Pt does have Lt groin pain along with positive scour, [employer] and [employer]. She also has limited hip flexion and internal rotation possibly indicating hip joint pathology as a contributor to her pain. Therapist discussed all of the above with the pt who verbalized agreement and understanding of the importance of increased HEP adherence moving forward.     PT Treatment/Interventions  ADLs/Self Care Home Management;Cryotherapy;Electrical Stimulation;Moist Heat;Stair training;Functional mobility training;Therapeutic activities;Therapeutic exercise;Patient/family education;Neuromuscular re-education;Balance training;Manual techniques;Taping;Dry needling;Passive range of motion;Traction    PT Next Visit Plan  d/c     PT Home Exercise Plan  wall squats      Consulted and Agree with Plan of Care  Patient       Patient will benefit from skilled therapeutic intervention in order to improve the following deficits and impairments:  Abnormal gait, Decreased activity tolerance, Decreased balance, Difficulty walking, Impaired flexibility, Obesity, Hypomobility, Decreased strength, Decreased range of motion, Decreased mobility, Increased muscle spasms, Postural dysfunction, Pain, Improper body mechanics  Visit Diagnosis: Chronic low back pain, unspecified back pain laterality, with sciatica presence unspecified  Muscle weakness (generalized)  Pain in left hip  Muscle spasm of back    PHYSICAL THERAPY DISCHARGE SUMMARY  Visits from Start of Care: 22  Current functional level related to goals / functional outcomes: See above for more details    Remaining deficits: See above for more details    Education / Equipment: See above for more details  Plan: Patient agrees to discharge.  Patient goals were partially met. Patient is being discharged due to lack of progress.  ?????       Problem List There are no active problems to display for this patient.   1:55 PM,06/27/17 [place], [place] at [place]  [facility] [address] [place], [place], [address] Phone: [phone]   Fax:  [phone]  Name: Jennifer Franco MRN: [id number] Date of Birth: 1956/04/26

## 2017-07-14 DIAGNOSIS — M79672 Pain in left foot: Secondary | ICD-10-CM | POA: Diagnosis not present

## 2017-07-14 DIAGNOSIS — M79671 Pain in right foot: Secondary | ICD-10-CM | POA: Diagnosis not present

## 2017-07-17 ENCOUNTER — Other Ambulatory Visit: Payer: Self-pay | Admitting: Family Medicine

## 2017-07-17 DIAGNOSIS — Z1231 Encounter for screening mammogram for malignant neoplasm of breast: Secondary | ICD-10-CM

## 2017-08-17 DIAGNOSIS — Z79899 Other long term (current) drug therapy: Secondary | ICD-10-CM | POA: Diagnosis not present

## 2017-08-17 DIAGNOSIS — H04123 Dry eye syndrome of bilateral lacrimal glands: Secondary | ICD-10-CM | POA: Diagnosis not present

## 2017-08-17 DIAGNOSIS — M3501 Sicca syndrome with keratoconjunctivitis: Secondary | ICD-10-CM | POA: Diagnosis not present

## 2017-08-21 ENCOUNTER — Ambulatory Visit
Admission: RE | Admit: 2017-08-21 | Discharge: 2017-08-21 | Disposition: A | Discharge status: Home / Self Care | Payer: BLUE CROSS/BLUE SHIELD | Source: Ambulatory Visit | Attending: Family Medicine | Admitting: Family Medicine

## 2017-08-21 ENCOUNTER — Ambulatory Visit: Payer: BLUE CROSS/BLUE SHIELD

## 2017-08-21 DIAGNOSIS — Z1231 Encounter for screening mammogram for malignant neoplasm of breast: Secondary | ICD-10-CM

## 2017-09-18 DIAGNOSIS — L720 Epidermal cyst: Secondary | ICD-10-CM | POA: Diagnosis not present

## 2017-09-18 DIAGNOSIS — D229 Melanocytic nevi, unspecified: Secondary | ICD-10-CM | POA: Diagnosis not present

## 2017-09-20 DIAGNOSIS — I8311 Varicose veins of right lower extremity with inflammation: Secondary | ICD-10-CM | POA: Diagnosis not present

## 2017-09-20 DIAGNOSIS — I8312 Varicose veins of left lower extremity with inflammation: Secondary | ICD-10-CM | POA: Diagnosis not present

## 2017-09-20 DIAGNOSIS — R600 Localized edema: Secondary | ICD-10-CM | POA: Diagnosis not present

## 2017-09-20 DIAGNOSIS — B351 Tinea unguium: Secondary | ICD-10-CM | POA: Diagnosis not present

## 2017-10-26 DIAGNOSIS — I8311 Varicose veins of right lower extremity with inflammation: Secondary | ICD-10-CM | POA: Diagnosis not present

## 2017-10-26 DIAGNOSIS — I8312 Varicose veins of left lower extremity with inflammation: Secondary | ICD-10-CM | POA: Diagnosis not present

## 2017-10-26 DIAGNOSIS — R600 Localized edema: Secondary | ICD-10-CM | POA: Diagnosis not present

## 2017-11-08 DIAGNOSIS — I8311 Varicose veins of right lower extremity with inflammation: Secondary | ICD-10-CM | POA: Diagnosis not present

## 2017-11-15 DIAGNOSIS — G5600 Carpal tunnel syndrome, unspecified upper limb: Secondary | ICD-10-CM | POA: Diagnosis not present

## 2017-11-15 DIAGNOSIS — B351 Tinea unguium: Secondary | ICD-10-CM | POA: Diagnosis not present

## 2017-11-15 DIAGNOSIS — M069 Rheumatoid arthritis, unspecified: Secondary | ICD-10-CM | POA: Diagnosis not present

## 2017-11-15 DIAGNOSIS — R109 Unspecified abdominal pain: Secondary | ICD-10-CM | POA: Diagnosis not present

## 2017-11-15 DIAGNOSIS — I868 Varicose veins of other specified sites: Secondary | ICD-10-CM | POA: Diagnosis not present

## 2017-11-20 DIAGNOSIS — R87612 Low grade squamous intraepithelial lesion on cytologic smear of cervix (LGSIL): Secondary | ICD-10-CM | POA: Diagnosis not present

## 2017-12-15 ENCOUNTER — Emergency Department (HOSPITAL_COMMUNITY)
Admission: EM | Admit: 2017-12-15 | Discharge: 2017-12-16 | Disposition: A | Discharge status: Home / Self Care | Payer: BLUE CROSS/BLUE SHIELD | Attending: Emergency Medicine | Admitting: Emergency Medicine

## 2017-12-15 ENCOUNTER — Ambulatory Visit (INDEPENDENT_AMBULATORY_CARE_PROVIDER_SITE_OTHER)
Admission: EM | Admit: 2017-12-15 | Discharge: 2017-12-15 | Disposition: A | Discharge status: Home / Self Care | Payer: BLUE CROSS/BLUE SHIELD | Source: Home / Self Care | Attending: Emergency Medicine | Admitting: Emergency Medicine

## 2017-12-15 ENCOUNTER — Encounter (HOSPITAL_COMMUNITY): Payer: Self-pay | Admitting: Emergency Medicine

## 2017-12-15 ENCOUNTER — Other Ambulatory Visit: Payer: Self-pay

## 2017-12-15 DIAGNOSIS — R197 Diarrhea, unspecified: Secondary | ICD-10-CM | POA: Diagnosis not present

## 2017-12-15 DIAGNOSIS — M3500 Sicca syndrome, unspecified: Secondary | ICD-10-CM

## 2017-12-15 DIAGNOSIS — R112 Nausea with vomiting, unspecified: Secondary | ICD-10-CM | POA: Insufficient documentation

## 2017-12-15 DIAGNOSIS — Z79899 Other long term (current) drug therapy: Secondary | ICD-10-CM | POA: Insufficient documentation

## 2017-12-15 DIAGNOSIS — Z803 Family history of malignant neoplasm of breast: Secondary | ICD-10-CM | POA: Insufficient documentation

## 2017-12-15 HISTORY — DX: Unspecified osteoarthritis, unspecified site: M19.90

## 2017-12-15 LAB — CBC WITH DIFFERENTIAL/PLATELET
Abs Immature Granulocytes: 0 10*3/uL (ref 0.0–0.1)
Basophils Absolute: 0 10*3/uL (ref 0.0–0.1)
Basophils Relative: 1 %
Eosinophils Absolute: 0 10*3/uL (ref 0.0–0.7)
Eosinophils Relative: 1 %
HCT: 44.2 % (ref 36.0–46.0)
Hemoglobin: 14.8 g/dL (ref 12.0–15.0)
Immature Granulocytes: 0 %
Lymphocytes Relative: 13 %
Lymphs Abs: 0.5 10*3/uL — ABNORMAL LOW (ref 0.7–4.0)
MCH: 29.3 pg (ref 26.0–34.0)
MCHC: 33.5 g/dL (ref 30.0–36.0)
MCV: 87.5 fL (ref 78.0–100.0)
Monocytes Absolute: 0.1 10*3/uL (ref 0.1–1.0)
Monocytes Relative: 2 %
Neutro Abs: 3.6 10*3/uL (ref 1.7–7.7)
Neutrophils Relative %: 83 %
Platelets: 251 10*3/uL (ref 150–400)
RBC: 5.05 MIL/uL (ref 3.87–5.11)
RDW: 14.3 % (ref 11.5–15.5)
WBC: 4.2 10*3/uL (ref 4.0–10.5)

## 2017-12-15 LAB — POCT I-STAT, CHEM 8
BUN: 12 mg/dL (ref 6–20)
Calcium, Ion: 1.13 mmol/L — ABNORMAL LOW (ref 1.15–1.40)
Chloride: 107 mmol/L (ref 98–111)
Creatinine, Ser: 0.8 mg/dL (ref 0.44–1.00)
Glucose, Bld: 150 mg/dL — ABNORMAL HIGH (ref 70–99)
HCT: 46 % (ref 36.0–46.0)
Hemoglobin: 15.6 g/dL — ABNORMAL HIGH (ref 12.0–15.0)
Potassium: 4 mmol/L (ref 3.5–5.1)
Sodium: 142 mmol/L (ref 135–145)
TCO2: 25 mmol/L (ref 22–32)

## 2017-12-15 MED ORDER — SODIUM CHLORIDE 0.9 % IV BOLUS
1000.0000 mL | Freq: Once | INTRAVENOUS | Status: AC
  Administered 2017-12-16: 1000 mL via INTRAVENOUS

## 2017-12-15 MED ORDER — PROMETHAZINE HCL 25 MG/ML IJ SOLN
25.0000 mg | Freq: Once | INTRAMUSCULAR | Status: AC
  Administered 2017-12-16: 25 mg via INTRAVENOUS
  Filled 2017-12-15: qty 1

## 2017-12-15 MED ORDER — SODIUM CHLORIDE 0.9 % IV SOLN
Freq: Once | INTRAVENOUS | Status: AC
  Administered 2017-12-15: 18:00:00 via INTRAVENOUS

## 2017-12-15 MED ORDER — ONDANSETRON HCL 4 MG/2ML IJ SOLN
INTRAMUSCULAR | Status: AC
  Filled 2017-12-15: qty 2

## 2017-12-15 MED ORDER — ONDANSETRON HCL 4 MG/2ML IJ SOLN
4.0000 mg | Freq: Once | INTRAMUSCULAR | Status: AC
  Administered 2017-12-15: 4 mg via INTRAVENOUS

## 2017-12-15 MED ORDER — ONDANSETRON HCL 4 MG/2ML IJ SOLN: 4.0000 mg | Freq: Once | INTRAMUSCULAR | Status: DC

## 2017-12-15 NOTE — ED Triage Notes (Signed)
Provider to triage

## 2017-12-15 NOTE — ED Provider Notes (Signed)
[place]    CSN: [id number] Arrival date & time: 12/15/17  1702     History   Chief Complaint Chief Complaint  Patient presents with  . Emesis    HPI Jennifer Franco is a 62 y.o. female.  PLEASE SEE DR. Pauletta Browns CHART.  ASSUMED CARE AT 1800.  HPI   Patient is a 62 year old female presenting today with complaints of nausea vomiting and abdominal discomfort.  Assumed care from Dr. Theda Belfast at 1800.  Patient has a history of Sjogren's syndrome.  She has Zofran for which she can use at home which she states has not been working.  Patient has an IV in place with an order for 1 L wide open and 4 mg IV Zofran.  Past Medical History:  Diagnosis Date  . Sjogren's syndrome ([place])     There are no active problems to display for this patient.   History reviewed. No pertinent surgical history.  OB History   None      Home Medications    Prior to Admission medications   Medication Sig Start Date End Date Taking? Authorizing Provider  ondansetron (ZOFRAN) 8 MG tablet Take 1 tablet (8 mg total) by mouth every 8 (eight) hours as needed for nausea or vomiting. 06/30/13   Gregor Hams, MD    Family History Family History  Problem Relation Age of Onset  . Breast cancer Mother 45  . Breast cancer Other 82       Maternal Great Grandmother    Social History Social History   Tobacco Use  . Smoking status: Never Smoker  Substance Use Topics  . Alcohol use: Yes  . Drug use: No     Allergies   Patient has no known allergies.   Review of Systems Review of Systems  Constitutional: Positive for appetite change and fatigue. Negative for fever.  HENT: Negative.   Eyes: Negative.  Negative for visual disturbance.  Respiratory: Negative.  Negative for cough and shortness of breath.   Cardiovascular: Negative.  Negative for chest pain and leg swelling.  Gastrointestinal: Positive for nausea and vomiting.  Endocrine: Negative.   Genitourinary:  Negative.   Musculoskeletal: Negative.  Negative for gait problem and neck stiffness.  Skin: Negative.   Allergic/Immunologic: Negative.   Neurological: Negative.  Negative for dizziness and headaches.  Hematological: Negative.   Psychiatric/Behavioral: Negative.      Physical Exam Triage Vital Signs ED Triage Vitals [12/15/17 1740]  Enc Vitals Group     BP (!) 145/63     Pulse Rate (!) 54     Resp 18     Temp 98.8 F (37.1 C)     Temp Source Oral     SpO2 98 %     Weight      Height      Head Circumference      Peak Flow      Pain Score      Pain Loc      Pain Edu?      Excl. in [place]?    Orthostatic VS for the past 24 hrs:  BP- Lying Pulse- Lying BP- Sitting Pulse- Sitting BP- Standing at 0 minutes Pulse- Standing at 0 minutes  12/15/17 1810 170/72 53 166/71 59 169/72 59    Updated Vital Signs BP (!) 145/63 (BP Location: Left Arm)   Pulse (!) 54   Temp 98.8 F (37.1 C) (Oral)   Resp 18   SpO2 98%  Physical Exam  Constitutional: She is oriented to person, place, and time. She appears well-developed and well-nourished. No distress.  Appears uncomfortable and has emesis bag nearby.  Eyes: Right eye exhibits no discharge. Left eye exhibits no discharge. No scleral icterus.  Neck: Normal range of motion. Neck supple.  Cardiovascular: Normal rate, regular rhythm, normal heart sounds and intact distal pulses. Exam reveals no gallop and no friction rub.  No murmur heard. Pulmonary/Chest: Effort normal and breath sounds normal. No respiratory distress.  Abdominal: Soft. Bowel sounds are normal. She exhibits no distension and no mass. There is no hepatosplenomegaly. There is no tenderness. There is no rigidity, no rebound, no guarding, no CVA tenderness, no tenderness at McBurney's point and negative Murphy's sign.  Musculoskeletal: Normal range of motion.  Neurological: She is alert and oriented to person, place, and time.  Skin: Skin is warm and dry. No rash noted. She  is not diaphoretic.  Nursing note and vitals reviewed.  Patient states she "feels better" after IV fluid and medication.  However, still vomiting.  Patient wants to wait a few minutes after trying some ice to see if she can keep that down before going to the emergency department for further evaluation.  UC Treatments / Results  Labs (all labs ordered are listed, but only abnormal results are displayed) Labs Reviewed  POCT I-STAT, CHEM 8 - Abnormal; Notable for the following components:      Result Value   Glucose, Bld 150 (*)    Calcium, Ion 1.13 (*)    Hemoglobin 15.6 (*)    All other components within normal limits  CBC WITH DIFFERENTIAL/PLATELET    EKG None  Radiology No results found.  Procedures Procedures (including critical care time)  Medications Ordered in UC Medications  0.9 %  sodium chloride infusion ( Intravenous New Bag/Given 12/15/17 1810)  ondansetron (ZOFRAN) injection 4 mg (4 mg Intravenous Given 12/15/17 1807)    Initial Impression / Assessment and Plan / UC Course  I have reviewed the triage vital signs and the nursing notes.  Pertinent labs & imaging results that were available during my care of the patient were reviewed by me and considered in my medical decision making (see chart for details).    Patient is unable to tolerate ice chips following 1 L normal saline IV infusion and 4 mg of IV Zofran.  Patient agrees transfer of care to [facility] emergency department for further evaluation.   Final Clinical Impressions(s) / UC Diagnoses   Final diagnoses:  None   Discharge Instructions   None    ED Prescriptions    None     Controlled Substance Prescriptions [place] Controlled Substance Registry consulted? Not Applicable   The usual and customary discharge instructions and warnings were given.  The patient verbalizes understanding and agrees to plan of care.      Nehemiah Settle, NP 12/15/17 1943

## 2017-12-15 NOTE — ED Provider Notes (Signed)
[place] DEPT Provider Note   CSN: [id number] Arrival date & time: 12/15/17  1958     History   Chief Complaint Chief Complaint  Patient presents with  . Emesis    HPI Jennifer Franco is a 62 y.o. female.  Patient with past medical history remarkable for Sjogren's syndrome on daily Plaquenil x8 years, presents to the emergency department with chief complaint of nausea, vomiting, diarrhea.  She reports having had some feeling of stomach upset for the past 2 to 3 days, but today began vomiting and having diarrhea.  She denies any abdominal pain.  She denies any fevers, chills, or other associated symptoms.  She is tried taking Zofran, and was also seen in urgent care today and given Zofran and fluids, but was unable to tolerate oral intake and was encouraged to come to the emergency department for further evaluation.  She vomited immediately prior to being interviewed, and states that she now is feeling better.  Denies any dysuria.  There are no aggravating or alleviating factors.  The history is provided by the patient. No language interpreter was used.    Past Medical History:  Diagnosis Date  . Arthritis   . Sjogren's syndrome ([place])     There are no active problems to display for this patient.   Past Surgical History:  Procedure Laterality Date  . ADENOIDECTOMY    . CARPAL TUNNEL RELEASE Right   . CESAREAN SECTION    . CESAREAN SECTION       OB History   None      Home Medications    Prior to Admission medications   Medication Sig Start Date End Date Taking? Authorizing Provider  ondansetron (ZOFRAN) 8 MG tablet Take 1 tablet (8 mg total) by mouth every 8 (eight) hours as needed for nausea or vomiting. 06/30/13   Gregor Hams, MD    Family History Family History  Problem Relation Age of Onset  . Breast cancer Mother 1  . Breast cancer Other 85       Maternal Great Grandmother    Social History Social History    Tobacco Use  . Smoking status: Never Smoker  . Smokeless tobacco: Never Used  Substance Use Topics  . Alcohol use: Yes  . Drug use: No     Allergies   Patient has no known allergies.   Review of Systems Review of Systems  All other systems reviewed and are negative.    Physical Exam Updated Vital Signs BP (!) 165/81 (BP Location: Right Arm)   Pulse 60   Temp 98.8 F (37.1 C) (Oral)   Resp 17   Ht 5\' 3"  (1.6 m)   Wt 86.6 kg   SpO2 100%   BMI 33.83 kg/m   Physical Exam  Constitutional: She is oriented to person, place, and time. She appears well-developed and well-nourished.  HENT:  Head: Normocephalic and atraumatic.  Eyes: Pupils are equal, round, and reactive to light. Conjunctivae and EOM are normal.  Neck: Normal range of motion. Neck supple.  Cardiovascular: Normal rate and regular rhythm. Exam reveals no gallop and no friction rub.  No murmur heard. Pulmonary/Chest: Effort normal and breath sounds normal. No respiratory distress. She has no wheezes. She has no rales. She exhibits no tenderness.  Abdominal: Soft. Bowel sounds are normal. She exhibits no distension and no mass. There is no tenderness. There is no rebound and no guarding.  No focal abdominal tenderness, no RLQ tenderness or  pain at McBurney's point, no RUQ tenderness or Murphy's sign, no left-sided abdominal tenderness, no fluid wave, or signs of peritonitis   Musculoskeletal: Normal range of motion. She exhibits no edema or tenderness.  Neurological: She is alert and oriented to person, place, and time.  Skin: Skin is warm and dry.  Psychiatric: She has a normal mood and affect. Her behavior is normal. Judgment and thought content normal.  Nursing note and vitals reviewed.    ED Treatments / Results  Labs (all labs ordered are listed, but only abnormal results are displayed) Labs Reviewed  COMPREHENSIVE METABOLIC PANEL - Abnormal; Notable for the following components:      Result Value    Potassium 3.2 (*)    Glucose, Bld 138 (*)    All other components within normal limits  URINALYSIS, ROUTINE W REFLEX MICROSCOPIC - Abnormal; Notable for the following components:   Ketones, ur 80 (*)    All other components within normal limits  LIPASE, BLOOD  CBC WITH DIFFERENTIAL/PLATELET  I-STAT CG4 LACTIC ACID, ED    EKG None  Radiology No results found.  Procedures Procedures (including critical care time)  Medications Ordered in ED Medications  sodium chloride 0.9 % bolus 1,000 mL (has no administration in time range)  promethazine (PHENERGAN) injection 25 mg (has no administration in time range)     Initial Impression / Assessment and Plan / ED Course  I have reviewed the triage vital signs and the nursing notes.  Pertinent labs & imaging results that were available during my care of the patient were reviewed by me and considered in my medical decision making (see chart for details).     Patient with nausea, vomiting, diarrhea x1 day.  Has had some feelings of stomach upset x3 days.  She is afebrile.  Vital signs stable.  Abdomen is soft and nontender.  We will give fluids, check labs, and will reassess.  I have low suspicion for acute abdomen in this well-appearing patient.  Patient has tolerated drinking some water in the emergency department.  She states that she still feels slightly nauseated, but states that she was finally able to get some sleep.  I believe that the Phenergan is working better than the Zofran for her.  We will discontinue the Zofran and will prescribe Phenergan.  Abdomen is soft and nontender.  Vital signs are stable.  She is afebrile.  Laboratory work-up is reassuring.  Specific return precautions given.  Final Clinical Impressions(s) / ED Diagnoses   Final diagnoses:  Nausea vomiting and diarrhea    ED Discharge Orders         Ordered    promethazine (PHENERGAN) 25 MG tablet  Every 6 hours PRN     12/16/17 0304            Montine Circle, PA-C 12/16/17 0305    Leonette Monarch Grayce Sessions, MD 12/16/17 1739

## 2017-12-15 NOTE — ED Notes (Signed)
Blood draw delated.  RN sts she will place a line and obtain labs.

## 2017-12-15 NOTE — ED Notes (Signed)
Bed: RK47 Expected date:  Expected time:  Means of arrival:  Comments: TR 3

## 2017-12-15 NOTE — ED Triage Notes (Signed)
Pt presents from the [facility] urgent care with complaints of emesis and chills. Patient states her stomach felt "funny" x3 days ago. Patient states got bad at 0300 this morning with worsening nausea and vomiting. Patient states her abdomen feels tender. Patient presents with an IV in place from the urgent care.

## 2017-12-15 NOTE — ED Provider Notes (Signed)
[facility]    CSN: [id number] Arrival date & time: 12/15/17  1702     History   Chief Complaint Chief Complaint  Patient presents with  . Emesis    HPI Jennifer Franco is a 62 y.o. female.   HPI This woman presents with nausea and vomiting x 3 days, much worse today in the context of having Sjogrens syndrome with arthritis.  She takes Plaquenil with a dose that has not changed in 15 months.  She has some abdominal discomfort, light headedness.  She tried Zofran which worked in the past with an episode like this (3 years ago) but it has not worked.  She had a colonoscopy two years ago which showed a polyp and she is due for another colonoscopy in 3 years.  No diarrhea.  No recent change in meds. Past Medical History:  Diagnosis Date  . Sjogren's syndrome ([place])     There are no active problems to display for this patient.   History reviewed. No pertinent surgical history.  OB History   None      Home Medications    Prior to Admission medications   Medication Sig Start Date End Date Taking? Authorizing Provider  ondansetron (ZOFRAN) 8 MG tablet Take 1 tablet (8 mg total) by mouth every 8 (eight) hours as needed for nausea or vomiting. 06/30/13   Gregor Hams, MD    Family History Family History  Problem Relation Age of Onset  . Breast cancer Mother 43  . Breast cancer Other 69       Maternal Great Grandmother    Social History Social History   Tobacco Use  . Smoking status: Never Smoker  Substance Use Topics  . Alcohol use: Yes  . Drug use: No     Allergies   Patient has no known allergies.   Review of Systems Review of Systems   Physical Exam Triage Vital Signs ED Triage Vitals  Enc Vitals Group     BP      Pulse      Resp      Temp      Temp src      SpO2      Weight      Height      Head Circumference      Peak Flow      Pain Score      Pain Loc      Pain Edu?      Excl. in [place]?    No data  found.  Updated Vital Signs BP (!) 145/63 (BP Location: Left Arm)   Pulse (!) 54   Temp 98.8 F (37.1 C) (Oral)   Resp 18   SpO2 98%   Physical Exam  Constitutional: She is oriented to person, place, and time. She appears well-developed and well-nourished.  Eyes: Conjunctivae are normal.  Neck: Normal range of motion. Neck supple.  Cardiovascular: Normal rate, regular rhythm and normal heart sounds.  Pulmonary/Chest: Effort normal and breath sounds normal.  Abdominal: Soft. She exhibits no distension and no mass. There is no tenderness. There is no guarding.  Neurological: She is alert and oriented to person, place, and time.  Skin: Skin is warm and dry.  Psychiatric: She has a normal mood and affect. Her behavior is normal. Judgment and thought content normal.  Nursing note and vitals reviewed.    UC Treatments / Results  Labs (all labs ordered are listed, but only abnormal results are displayed)  Labs Reviewed  POCT I-STAT, CHEM 8 - Abnormal; Notable for the following components:      Result Value   Glucose, Bld 150 (*)    Calcium, Ion 1.13 (*)    Hemoglobin 15.6 (*)    All other components within normal limits  CBC WITH DIFFERENTIAL/PLATELET    EKG None  Radiology No results found.  Procedures Procedures (including critical care time)  Medications Ordered in UC Medications  0.9 %  sodium chloride infusion (has no administration in time range)  ondansetron (ZOFRAN) injection 4 mg (has no administration in time range)    Initial Impression / Assessment and Plan / UC Course  I have reviewed the triage vital signs and the nursing notes.  Pertinent labs & imaging results that were available during my care of the patient were reviewed by me and considered in my medical decision making (see chart for details).     Final Clinical Impressions(s) / UC Diagnoses   Final diagnoses:  None   Discharge Instructions   None    ED Prescriptions    None      Controlled Substance Prescriptions [place] Controlled Substance Registry consulted? Not Applicable   Robyn Haber, MD 12/15/17 [phone]

## 2017-12-16 DIAGNOSIS — R112 Nausea with vomiting, unspecified: Secondary | ICD-10-CM | POA: Diagnosis not present

## 2017-12-16 LAB — CBC WITH DIFFERENTIAL/PLATELET
Basophils Absolute: 0 10*3/uL (ref 0.0–0.1)
Basophils Relative: 0 %
Eosinophils Absolute: 0 10*3/uL (ref 0.0–0.7)
Eosinophils Relative: 0 %
HCT: 40 % (ref 36.0–46.0)
Hemoglobin: 13.7 g/dL (ref 12.0–15.0)
Lymphocytes Relative: 15 %
Lymphs Abs: 0.7 10*3/uL (ref 0.7–4.0)
MCH: 29.8 pg (ref 26.0–34.0)
MCHC: 34.3 g/dL (ref 30.0–36.0)
MCV: 87 fL (ref 78.0–100.0)
Monocytes Absolute: 0.1 10*3/uL (ref 0.1–1.0)
Monocytes Relative: 2 %
Neutro Abs: 3.9 10*3/uL (ref 1.7–7.7)
Neutrophils Relative %: 83 %
Platelets: 224 10*3/uL (ref 150–400)
RBC: 4.6 MIL/uL (ref 3.87–5.11)
RDW: 14.4 % (ref 11.5–15.5)
WBC: 4.7 10*3/uL (ref 4.0–10.5)

## 2017-12-16 LAB — COMPREHENSIVE METABOLIC PANEL
ALT: 16 U/L (ref 0–44)
AST: 17 U/L (ref 15–41)
Albumin: 3.9 g/dL (ref 3.5–5.0)
Alkaline Phosphatase: 38 U/L (ref 38–126)
Anion gap: 10 (ref 5–15)
BUN: 12 mg/dL (ref 6–20)
CO2: 23 mmol/L (ref 22–32)
Calcium: 9.3 mg/dL (ref 8.9–10.3)
Chloride: 111 mmol/L (ref 98–111)
Creatinine, Ser: 0.72 mg/dL (ref 0.44–1.00)
GFR calc Af Amer: >60 mL/min (ref >60)
GFR calc non Af Amer: >60 mL/min (ref >60)
Glucose, Bld: 138 mg/dL — ABNORMAL HIGH (ref 70–99)
Potassium: 3.2 mmol/L — ABNORMAL LOW (ref 3.5–5.1)
Sodium: 144 mmol/L (ref 135–145)
Total Bilirubin: 0.7 mg/dL (ref 0.3–1.2)
Total Protein: 6.6 g/dL (ref 6.5–8.1)

## 2017-12-16 LAB — URINALYSIS, ROUTINE W REFLEX MICROSCOPIC
Bilirubin Urine: NEGATIVE
Glucose, UA: NEGATIVE mg/dL
Hgb urine dipstick: NEGATIVE
Ketones, ur: 80 mg/dL — AB
Leukocytes, UA: NEGATIVE
Nitrite: NEGATIVE
Protein, ur: NEGATIVE mg/dL
Specific Gravity, Urine: 1.015 (ref 1.005–1.030)
pH: 6 (ref 5.0–8.0)

## 2017-12-16 LAB — I-STAT CG4 LACTIC ACID, ED: Lactic Acid, Venous: 1.21 mmol/L (ref 0.5–1.9)

## 2017-12-16 LAB — LIPASE, BLOOD: Lipase: 22 U/L (ref 11–51)

## 2017-12-16 MED ORDER — PROMETHAZINE HCL 25 MG/ML IJ SOLN
25.0000 mg | Freq: Once | INTRAMUSCULAR | Status: AC
  Administered 2017-12-16: 25 mg via INTRAVENOUS
  Filled 2017-12-16: qty 1

## 2017-12-16 MED ORDER — PROMETHAZINE HCL 25 MG PO TABS: 25.0000 mg | ORAL_TABLET | Freq: Four times a day (QID) | ORAL | 0 refills | Status: DC | PRN

## 2017-12-16 NOTE — ED Notes (Signed)
Pt aware that urine sample is needed.  

## 2017-12-16 NOTE — ED Notes (Signed)
Pt given water for fluid challenge.  After taking a few sips, pt sts she feels a little nauseous.  RN notified.

## 2017-12-16 NOTE — ED Notes (Signed)
Pt ambulated to the bathroom and provided urine sample.

## 2018-03-22 DIAGNOSIS — F39 Unspecified mood [affective] disorder: Secondary | ICD-10-CM | POA: Diagnosis not present

## 2018-03-22 DIAGNOSIS — M069 Rheumatoid arthritis, unspecified: Secondary | ICD-10-CM | POA: Diagnosis not present

## 2018-03-22 DIAGNOSIS — Z23 Encounter for immunization: Secondary | ICD-10-CM | POA: Diagnosis not present

## 2018-03-22 DIAGNOSIS — D649 Anemia, unspecified: Secondary | ICD-10-CM | POA: Diagnosis not present

## 2018-03-22 DIAGNOSIS — Z13220 Encounter for screening for lipoid disorders: Secondary | ICD-10-CM | POA: Diagnosis not present

## 2018-03-22 DIAGNOSIS — R109 Unspecified abdominal pain: Secondary | ICD-10-CM | POA: Diagnosis not present

## 2018-03-22 DIAGNOSIS — Z0000 Encounter for general adult medical examination without abnormal findings: Secondary | ICD-10-CM | POA: Diagnosis not present

## 2018-03-22 DIAGNOSIS — M3500 Sicca syndrome, unspecified: Secondary | ICD-10-CM | POA: Diagnosis not present

## 2018-03-22 DIAGNOSIS — E039 Hypothyroidism, unspecified: Secondary | ICD-10-CM | POA: Diagnosis not present

## 2018-03-30 ENCOUNTER — Other Ambulatory Visit: Payer: Self-pay | Admitting: Physician Assistant

## 2018-03-30 DIAGNOSIS — R1032 Left lower quadrant pain: Secondary | ICD-10-CM | POA: Diagnosis not present

## 2018-03-30 DIAGNOSIS — R634 Abnormal weight loss: Secondary | ICD-10-CM

## 2018-03-30 DIAGNOSIS — R197 Diarrhea, unspecified: Secondary | ICD-10-CM

## 2018-03-30 DIAGNOSIS — Z86010 Personal history of colonic polyps: Secondary | ICD-10-CM | POA: Diagnosis not present

## 2018-04-09 ENCOUNTER — Ambulatory Visit
Admission: RE | Admit: 2018-04-09 | Discharge: 2018-04-09 | Disposition: A | Discharge status: Home / Self Care | Payer: BLUE CROSS/BLUE SHIELD | Source: Ambulatory Visit | Attending: Physician Assistant | Admitting: Physician Assistant

## 2018-04-09 DIAGNOSIS — R1032 Left lower quadrant pain: Secondary | ICD-10-CM

## 2018-04-09 DIAGNOSIS — R197 Diarrhea, unspecified: Secondary | ICD-10-CM

## 2018-04-09 DIAGNOSIS — K5730 Diverticulosis of large intestine without perforation or abscess without bleeding: Secondary | ICD-10-CM | POA: Diagnosis not present

## 2018-04-09 DIAGNOSIS — R634 Abnormal weight loss: Secondary | ICD-10-CM

## 2018-04-09 MED ORDER — IOPAMIDOL (ISOVUE-300) INJECTION 61%
100.0000 mL | Freq: Once | INTRAVENOUS | Status: AC | PRN
  Administered 2018-04-09: 100 mL via INTRAVENOUS

## 2018-04-19 DIAGNOSIS — M7671 Peroneal tendinitis, right leg: Secondary | ICD-10-CM | POA: Diagnosis not present

## 2018-04-19 DIAGNOSIS — B351 Tinea unguium: Secondary | ICD-10-CM | POA: Diagnosis not present

## 2018-04-19 DIAGNOSIS — L600 Ingrowing nail: Secondary | ICD-10-CM | POA: Diagnosis not present

## 2018-04-19 DIAGNOSIS — M79671 Pain in right foot: Secondary | ICD-10-CM | POA: Diagnosis not present

## 2018-04-20 DIAGNOSIS — R634 Abnormal weight loss: Secondary | ICD-10-CM | POA: Diagnosis not present

## 2018-04-20 DIAGNOSIS — R112 Nausea with vomiting, unspecified: Secondary | ICD-10-CM | POA: Diagnosis not present

## 2018-05-17 DIAGNOSIS — L709 Acne, unspecified: Secondary | ICD-10-CM | POA: Diagnosis not present

## 2018-05-22 DIAGNOSIS — K2930 Chronic superficial gastritis without bleeding: Secondary | ICD-10-CM | POA: Diagnosis not present

## 2018-05-22 DIAGNOSIS — K210 Gastro-esophageal reflux disease with esophagitis: Secondary | ICD-10-CM | POA: Diagnosis not present

## 2018-05-29 DIAGNOSIS — K2930 Chronic superficial gastritis without bleeding: Secondary | ICD-10-CM | POA: Diagnosis not present

## 2018-05-29 DIAGNOSIS — K210 Gastro-esophageal reflux disease with esophagitis: Secondary | ICD-10-CM | POA: Diagnosis not present

## 2018-06-08 DIAGNOSIS — K210 Gastro-esophageal reflux disease with esophagitis: Secondary | ICD-10-CM | POA: Diagnosis not present

## 2018-06-08 DIAGNOSIS — R1115 Cyclical vomiting syndrome unrelated to migraine: Secondary | ICD-10-CM | POA: Diagnosis not present

## 2018-06-08 DIAGNOSIS — Z87898 Personal history of other specified conditions: Secondary | ICD-10-CM | POA: Diagnosis not present

## 2018-07-05 DIAGNOSIS — L723 Sebaceous cyst: Secondary | ICD-10-CM | POA: Diagnosis not present

## 2018-07-05 DIAGNOSIS — L281 Prurigo nodularis: Secondary | ICD-10-CM | POA: Diagnosis not present

## 2018-10-15 DIAGNOSIS — N83201 Unspecified ovarian cyst, right side: Secondary | ICD-10-CM | POA: Diagnosis not present

## 2018-10-18 ENCOUNTER — Other Ambulatory Visit: Payer: Self-pay | Admitting: Family Medicine

## 2018-10-18 DIAGNOSIS — Z1231 Encounter for screening mammogram for malignant neoplasm of breast: Secondary | ICD-10-CM

## 2018-11-14 DIAGNOSIS — R1115 Cyclical vomiting syndrome unrelated to migraine: Secondary | ICD-10-CM | POA: Diagnosis not present

## 2018-11-14 DIAGNOSIS — R197 Diarrhea, unspecified: Secondary | ICD-10-CM | POA: Diagnosis not present

## 2018-11-14 DIAGNOSIS — R634 Abnormal weight loss: Secondary | ICD-10-CM | POA: Diagnosis not present

## 2018-11-14 DIAGNOSIS — K210 Gastro-esophageal reflux disease with esophagitis: Secondary | ICD-10-CM | POA: Diagnosis not present

## 2018-11-14 DIAGNOSIS — R6883 Chills (without fever): Secondary | ICD-10-CM | POA: Diagnosis not present

## 2018-11-15 DIAGNOSIS — R197 Diarrhea, unspecified: Secondary | ICD-10-CM | POA: Diagnosis not present

## 2018-11-15 DIAGNOSIS — R112 Nausea with vomiting, unspecified: Secondary | ICD-10-CM | POA: Diagnosis not present

## 2018-11-15 DIAGNOSIS — R6883 Chills (without fever): Secondary | ICD-10-CM | POA: Diagnosis not present

## 2018-11-23 ENCOUNTER — Other Ambulatory Visit: Payer: Self-pay | Admitting: Physician Assistant

## 2018-11-23 DIAGNOSIS — R1115 Cyclical vomiting syndrome unrelated to migraine: Secondary | ICD-10-CM

## 2018-11-28 ENCOUNTER — Encounter (HOSPITAL_COMMUNITY)
Admission: RE | Admit: 2018-11-28 | Discharge: 2018-11-28 | Disposition: A | Discharge status: Home / Self Care | Payer: BC Managed Care – PPO | Source: Ambulatory Visit | Attending: Physician Assistant | Admitting: Physician Assistant

## 2018-11-28 ENCOUNTER — Other Ambulatory Visit: Payer: Self-pay

## 2018-11-28 DIAGNOSIS — R1115 Cyclical vomiting syndrome unrelated to migraine: Secondary | ICD-10-CM | POA: Insufficient documentation

## 2018-11-28 DIAGNOSIS — R112 Nausea with vomiting, unspecified: Secondary | ICD-10-CM | POA: Diagnosis not present

## 2018-11-28 MED ORDER — TECHNETIUM TC 99M SULFUR COLLOID
2.0000 | Freq: Once | INTRAVENOUS | Status: AC | PRN
  Administered 2018-11-28: 2 via INTRAVENOUS

## 2018-12-04 ENCOUNTER — Other Ambulatory Visit: Payer: Self-pay | Admitting: Physician Assistant

## 2018-12-04 DIAGNOSIS — R1115 Cyclical vomiting syndrome unrelated to migraine: Secondary | ICD-10-CM

## 2018-12-05 ENCOUNTER — Ambulatory Visit
Admission: RE | Admit: 2018-12-05 | Discharge: 2018-12-05 | Disposition: A | Discharge status: Home / Self Care | Payer: BC Managed Care – PPO | Source: Ambulatory Visit | Attending: Family Medicine | Admitting: Family Medicine

## 2018-12-05 ENCOUNTER — Other Ambulatory Visit: Payer: Self-pay

## 2018-12-05 DIAGNOSIS — Z1231 Encounter for screening mammogram for malignant neoplasm of breast: Secondary | ICD-10-CM

## 2018-12-06 ENCOUNTER — Encounter (HOSPITAL_BASED_OUTPATIENT_CLINIC_OR_DEPARTMENT_OTHER): Payer: Self-pay

## 2018-12-06 ENCOUNTER — Other Ambulatory Visit: Payer: Self-pay

## 2018-12-06 ENCOUNTER — Emergency Department (HOSPITAL_BASED_OUTPATIENT_CLINIC_OR_DEPARTMENT_OTHER): Payer: BC Managed Care – PPO

## 2018-12-06 ENCOUNTER — Emergency Department (HOSPITAL_BASED_OUTPATIENT_CLINIC_OR_DEPARTMENT_OTHER)
Admission: EM | Admit: 2018-12-06 | Discharge: 2018-12-06 | Disposition: A | Discharge status: Home / Self Care | Payer: BC Managed Care – PPO | Attending: Emergency Medicine | Admitting: Emergency Medicine

## 2018-12-06 DIAGNOSIS — K5732 Diverticulitis of large intestine without perforation or abscess without bleeding: Secondary | ICD-10-CM | POA: Diagnosis not present

## 2018-12-06 DIAGNOSIS — Z79899 Other long term (current) drug therapy: Secondary | ICD-10-CM | POA: Diagnosis not present

## 2018-12-06 DIAGNOSIS — Z87891 Personal history of nicotine dependence: Secondary | ICD-10-CM | POA: Diagnosis not present

## 2018-12-06 DIAGNOSIS — K5792 Diverticulitis of intestine, part unspecified, without perforation or abscess without bleeding: Secondary | ICD-10-CM

## 2018-12-06 DIAGNOSIS — R1084 Generalized abdominal pain: Secondary | ICD-10-CM | POA: Diagnosis not present

## 2018-12-06 LAB — COMPREHENSIVE METABOLIC PANEL
ALT: 13 U/L (ref 0–44)
AST: 15 U/L (ref 15–41)
Albumin: 4.3 g/dL (ref 3.5–5.0)
Alkaline Phosphatase: 43 U/L (ref 38–126)
Anion gap: 12 (ref 5–15)
BUN: 9 mg/dL (ref 8–23)
CO2: 25 mmol/L (ref 22–32)
Calcium: 9.9 mg/dL (ref 8.9–10.3)
Chloride: 104 mmol/L (ref 98–111)
Creatinine, Ser: 0.79 mg/dL (ref 0.44–1.00)
GFR calc Af Amer: >60 mL/min (ref >60)
GFR calc non Af Amer: >60 mL/min (ref >60)
Glucose, Bld: 98 mg/dL (ref 70–99)
Potassium: 3.6 mmol/L (ref 3.5–5.1)
Sodium: 141 mmol/L (ref 135–145)
Total Bilirubin: 0.4 mg/dL (ref 0.3–1.2)
Total Protein: 7.6 g/dL (ref 6.5–8.1)

## 2018-12-06 LAB — CBC WITH DIFFERENTIAL/PLATELET
Abs Immature Granulocytes: 0.01 10*3/uL (ref 0.00–0.07)
Basophils Absolute: 0 10*3/uL (ref 0.0–0.1)
Basophils Relative: 1 %
Eosinophils Absolute: 0.1 10*3/uL (ref 0.0–0.5)
Eosinophils Relative: 1 %
HCT: 43.8 % (ref 36.0–46.0)
Hemoglobin: 14.1 g/dL (ref 12.0–15.0)
Immature Granulocytes: 0 %
Lymphocytes Relative: 45 %
Lymphs Abs: 2.3 10*3/uL (ref 0.7–4.0)
MCH: 29.4 pg (ref 26.0–34.0)
MCHC: 32.2 g/dL (ref 30.0–36.0)
MCV: 91.4 fL (ref 80.0–100.0)
Monocytes Absolute: 0.5 10*3/uL (ref 0.1–1.0)
Monocytes Relative: 10 %
Neutro Abs: 2.2 10*3/uL (ref 1.7–7.7)
Neutrophils Relative %: 43 %
Platelets: 283 10*3/uL (ref 150–400)
RBC: 4.79 MIL/uL (ref 3.87–5.11)
RDW: 13.1 % (ref 11.5–15.5)
WBC: 5.2 10*3/uL (ref 4.0–10.5)
nRBC: 0 % (ref 0.0–0.2)

## 2018-12-06 LAB — URINALYSIS, ROUTINE W REFLEX MICROSCOPIC
Bilirubin Urine: NEGATIVE
Glucose, UA: NEGATIVE mg/dL
Hgb urine dipstick: NEGATIVE
Ketones, ur: 15 mg/dL — AB
Leukocytes,Ua: NEGATIVE
Nitrite: NEGATIVE
Protein, ur: NEGATIVE mg/dL
Specific Gravity, Urine: >1.03 — ABNORMAL HIGH (ref 1.005–1.030)
pH: 6 (ref 5.0–8.0)

## 2018-12-06 LAB — OCCULT BLOOD X 1 CARD TO LAB, STOOL: Fecal Occult Bld: NEGATIVE

## 2018-12-06 LAB — LIPASE, BLOOD: Lipase: 25 U/L (ref 11–51)

## 2018-12-06 MED ORDER — HYDROCODONE-ACETAMINOPHEN 5-325 MG PO TABS: 1.0000 | ORAL_TABLET | Freq: Four times a day (QID) | ORAL | 0 refills | Status: DC | PRN

## 2018-12-06 MED ORDER — ONDANSETRON 4 MG PO TBDP
4.0000 mg | ORAL_TABLET | Freq: Once | ORAL | Status: AC
  Administered 2018-12-06: 4 mg via ORAL
  Filled 2018-12-06: qty 1

## 2018-12-06 MED ORDER — ONDANSETRON 4 MG PO TBDP: 4.0000 mg | ORAL_TABLET | Freq: Three times a day (TID) | ORAL | 0 refills | Status: DC | PRN

## 2018-12-06 MED ORDER — AMOXICILLIN-POT CLAVULANATE 875-125 MG PO TABS
1.0000 | ORAL_TABLET | Freq: Once | ORAL | Status: AC
  Administered 2018-12-06: 1 via ORAL
  Filled 2018-12-06: qty 1

## 2018-12-06 MED ORDER — AMOXICILLIN-POT CLAVULANATE 875-125 MG PO TABS: 1.0000 | ORAL_TABLET | Freq: Two times a day (BID) | ORAL | 0 refills | Status: AC

## 2018-12-06 MED ORDER — SODIUM CHLORIDE 0.9 % IV BOLUS
1000.0000 mL | Freq: Once | INTRAVENOUS | Status: AC
  Administered 2018-12-06: 1000 mL via INTRAVENOUS

## 2018-12-06 MED ORDER — IOHEXOL 300 MG/ML  SOLN
100.0000 mL | Freq: Once | INTRAMUSCULAR | Status: AC | PRN
  Administered 2018-12-06: 100 mL via INTRAVENOUS

## 2018-12-06 NOTE — ED Notes (Signed)
Chaperone for rectal exam.  Pt tolerated well.  No visible bleeding.

## 2018-12-06 NOTE — Discharge Instructions (Signed)
Take pain medications as directed for break through pain. Do not drive or operate machinery while taking this medication.   Take antibiotics as directed. Please take all of your antibiotics until finished.  Take nausea medication as directed.  As we discussed, you will need to follow-up with your GI doctor.  Return the emergency department for worsening abdominal pain, fever, vomiting or any other worsening or concerning symptoms.

## 2018-12-06 NOTE — ED Triage Notes (Addendum)
Pt c/o generalized abd pain x 3 weeks-pt reports multiple testing in the last 3 weeks-pt c/o weight loss and pain worse after eating-also c/o painful BMs-baseline is black tarry stools-NAD-steady gait

## 2018-12-06 NOTE — ED Provider Notes (Signed)
[facility] EMERGENCY DEPARTMENT Provider Note   CSN: [id number] Arrival date & time: 12/06/18  1430    History   Chief Complaint Chief Complaint  Patient presents with   Abdominal Pain    HPI Jennifer Franco is a 63 y.o. female with PMH/o arthritis, Sjogren's who presents for evaluation of 3 weeks of intermittent abdominal pain and vomiting.  She reports that over the last 3 weeks, she has had generalized abdominal pain.  She reports there is no real focal point of tenderness.  Additionally, she has had intermittent vomiting.  She states that mostly the vomiting and abdominal pain occurs after eating.  She reports about 2 or 3 hours after a trying to eat, she will vomit and have worsening abdominal pain.  No blood noted in vomit.  She reports that she has lost 12 pounds over last 3 weeks secondary to inability to eat.  She states that she has had some black tarry stools but states that this is normal for her.  She does report that this morning was her last bowel movement but states it was small hard stools and she had some associated pain in the rectum.  Additionally, she noticed some mucus in the stools.  Patient states that she has not noted any fevers, chest pain, difficulty breathing, urinary complaints.  She does state that she has had some recent night sweats.  She is currently followed by GI.  Her last colonoscopy was about 5 years ago and saw polyps but no other abnormalities.  Her next 1 is scheduled for next year.  She does not smoke.  She denies any frequent alcohol use.  She has no history of PUD or frequent NSAID use.      The history is provided by the patient.    Past Medical History:  Diagnosis Date   Arthritis    Sjogren's syndrome ([place])     There are no active problems to display for this patient.   Past Surgical History:  Procedure Laterality Date   ADENOIDECTOMY     CARPAL TUNNEL RELEASE Right    CESAREAN SECTION     CESAREAN SECTION        OB History   No obstetric history on file.      Home Medications    Prior to Admission medications   Medication Sig Start Date End Date Taking? Authorizing Provider  amoxicillin-clavulanate (AUGMENTIN) 875-125 MG tablet Take 1 tablet by mouth 2 (two) times daily for 7 days. 12/06/18 12/13/18  Providence Lanius A, PA-C  b complex vitamins tablet Take 1 tablet by mouth daily.     [provider]  Calcium Carb-Cholecalciferol (CALCIUM-VITAMIN D) 500-200 MG-UNIT tablet Take 1 tablet by mouth daily.     [provider]  Efinaconazole (JUBLIA) 10 % SOLN Apply 1 application topically daily.    [provider]  folic acid (FOLVITE) 1 MG tablet Take 1 mg by mouth daily.  04/06/15   [provider]  HYDROcodone-acetaminophen (NORCO/VICODIN) 5-325 MG tablet Take 1-2 tablets by mouth every 6 (six) hours as needed. 12/06/18   Volanda Napoleon, PA-C  hydroxychloroquine (PLAQUENIL) 200 MG tablet Take 200 mg by mouth daily.  03/26/10   [provider]  iron polysaccharides (NIFEREX) 150 MG capsule Take 150 mg by mouth daily.  03/26/10   [provider]  [name] Oil 500 MG CAPS Take 500 mg by mouth daily.    [provider]  levothyroxine (SYNTHROID, LEVOTHROID) 150 MCG tablet  Take 150 mcg by mouth daily before breakfast.    [provider]  Olive Leaf Extract 250 MG CAPS Take 250 capsules by mouth daily.     [provider]  ondansetron (ZOFRAN ODT) 4 MG disintegrating tablet Take 1 tablet (4 mg total) by mouth every 8 (eight) hours as needed for nausea or vomiting. 12/06/18   Volanda Napoleon, PA-C  polycarbophil (FIBERCON) 625 MG tablet Take 625 mg by mouth daily.    [provider]  Potassium 99 MG TABS Take 99 mg by mouth daily.  03/26/10   [provider]  promethazine (PHENERGAN) 25 MG tablet Take 1 tablet (25 mg total) by mouth every 6 (six) hours as needed for nausea or vomiting. 12/16/17   Montine Circle, PA-C    Family History Family History  Problem Relation Age of Onset   Breast cancer Mother 89   Breast cancer Other 53       Maternal [place] Grandmother    Social History Social History   Tobacco Use   Smoking status: Former Smoker   Smokeless tobacco: Never Used  Substance Use Topics   Alcohol use: Yes    Comment: occ   Drug use: No     Allergies   Etodolac and Topamax [topiramate]   Review of Systems Review of Systems  Constitutional: Positive for appetite change. Negative for fever.  Respiratory: Negative for cough and shortness of breath.   Cardiovascular: Negative for chest pain.  Gastrointestinal: Positive for abdominal pain, nausea and vomiting. Negative for constipation and diarrhea.  Genitourinary: Negative for dysuria and hematuria.  Neurological: Negative for headaches.  All other systems reviewed and are negative.    Physical Exam Updated Vital Signs BP 131/76 (BP Location: Left Arm)    Pulse 84    Temp 98.8 F (37.1 C) (Oral)    Resp 20    Ht 5\' 3"  (1.6 m)    Wt 73.9 kg    SpO2 99%    BMI 28.87 kg/m   Physical Exam Vitals signs and nursing note reviewed. Exam conducted with a chaperone present.  Constitutional:      Appearance: Normal appearance. She is well-developed.     Comments: Sitting comfortably on examination table  HENT:     Head: Normocephalic and atraumatic.  Eyes:     General: Lids are normal.     Conjunctiva/sclera: Conjunctivae normal.     Pupils: Pupils are equal, round, and reactive to light.  Neck:     Musculoskeletal: Full passive range of motion without pain.  Cardiovascular:     Rate and Rhythm: Normal rate and regular rhythm.     Pulses: Normal pulses.     Heart sounds: Normal heart sounds. No murmur. No friction rub. No gallop.   Pulmonary:     Effort: Pulmonary effort is normal.     Breath sounds: Normal breath sounds.  Abdominal:     Palpations: Abdomen is soft. Abdomen is not rigid.     Tenderness:  There is generalized abdominal tenderness and tenderness in the left lower quadrant. There is no right CVA tenderness, left CVA tenderness or guarding.     Comments: Abdomen is soft, non-distended. No rigidity, No guarding. No peritoneal signs.  Diffuse tenderness slightly worse in left lower quadrant.  Genitourinary:    Rectum: Normal. Guaiac result negative. No mass or tenderness.     Comments: The exam was performed with a chaperone present. Digital Rectal Exam reveals sphincter with good tone.  No external hemorrhoids. No masses or fissures. Stool color is brown with no overt blood. Musculoskeletal: Normal range of motion.  Skin:    General: Skin is warm and dry.     Capillary Refill: Capillary refill takes less than 2 seconds.  Neurological:     Mental Status: She is alert and oriented to person, place, and time.  Psychiatric:        Speech: Speech normal.      ED Treatments / Results  Labs (all labs ordered are listed, but only abnormal results are displayed) Labs Reviewed  URINALYSIS, ROUTINE W REFLEX MICROSCOPIC - Abnormal; Notable for the following components:      Result Value   Color, Urine AMBER (*)    Specific Gravity, Urine >1.030 (*)    Ketones, ur 15 (*)    All other components within normal limits  COMPREHENSIVE METABOLIC PANEL  CBC WITH DIFFERENTIAL/PLATELET  LIPASE, BLOOD  OCCULT BLOOD X 1 CARD TO LAB, STOOL    EKG None  Radiology Ct Abdomen Pelvis W Contrast  Result Date: 12/06/2018 CLINICAL DATA:  Acute abdominal pain generalized. EXAM: CT ABDOMEN AND PELVIS WITH CONTRAST TECHNIQUE: Multidetector CT imaging of the abdomen and pelvis was performed using the standard protocol following bolus administration of intravenous contrast. CONTRAST:  177mL OMNIPAQUE IOHEXOL 300 MG/ML  SOLN COMPARISON:  CT abdomen pelvis 04/09/2018 FINDINGS: Lower chest: Lung bases clear bilaterally. Hepatobiliary: 2 cm cyst in the caudal liver. Subcentimeter cyst left lobe of the  liver. Gallbladder and bile ducts normal. Pancreas: Negative for mass or edema Spleen: Negative Adrenals/Urinary Tract: Adrenal glands are unremarkable. Kidneys are normal, without renal calculi, focal lesion, or hydronephrosis. Bladder is unremarkable. Stomach/Bowel: Stomach and small bowel normal.  No bowel obstruction Segmental thickening of the sigmoid colon consistent with edema. Diverticula noted in the sigmoid colon. Findings compatible with acute diverticulitis without abscess. Vascular/Lymphatic: Mild atherosclerotic aorta. Negative for adenopathy Reproductive: Normal uterus.  No pelvic mass Other: No free fluid or abscess.  No free air. Musculoskeletal: Lumbar facet degeneration. No acute skeletal abnormality. Degenerative change in both hips. IMPRESSION: Findings compatible with acute diverticulitis sigmoid colon. No abscess or bowel obstruction. No free fluid or free air. Electronically Signed   By: Franchot Gallo M.D.   On: 12/06/2018 19:50   Mm 3d Screen Breast Bilateral  Result Date: 12/05/2018 CLINICAL DATA:  Screening. EXAM: DIGITAL SCREENING BILATERAL MAMMOGRAM WITH TOMO AND CAD COMPARISON:  Previous exam(s). ACR Breast Density Category b: There are scattered areas of fibroglandular density. FINDINGS: There are no findings suspicious for malignancy. Images were processed with CAD. IMPRESSION: No mammographic evidence of malignancy. A result letter of this screening mammogram will be mailed directly to the patient. RECOMMENDATION: Screening mammogram in one year. (Code:SM-B-01Y) BI-RADS CATEGORY  1: Negative. Electronically Signed   By: Lovey Newcomer M.D.   On: 12/05/2018 16:40    Procedures Procedures (including critical care time)  Medications Ordered in ED Medications  amoxicillin-clavulanate (AUGMENTIN) 875-125 MG per tablet 1 tablet (has no administration in time range)  ondansetron (ZOFRAN-ODT) disintegrating tablet 4 mg (has no administration in time range)  sodium chloride 0.9 %  bolus 1,000 mL ( Intravenous Stopped 12/06/18 2035)  iohexol (OMNIPAQUE) 300 MG/ML solution 100 mL (100 mLs Intravenous Contrast Given 12/06/18 1920)     Initial Impression / Assessment and Plan / ED Course  I have reviewed the triage vital signs and the nursing notes.  Pertinent labs & imaging results that were available during my care of  the patient were reviewed by me and considered in my medical decision making (see chart for details).        63 year old female who presents for evaluation of 3 weeks of generalized abdominal pain, nausea/vomiting, decreased appetite.  No fevers but has noted a 12 pound weight loss as well as some night sweats.  She has a longstanding history of chronic abdominal pain as well as some cyclical vomiting for which she follows with [facility].  She also reports she has had chronic black tarry stools.  She is not currently on blood thinners. Patient is afebrile, non-toxic appearing, sitting comfortably on examination table. Vital signs reviewed and stable.  On exam, she has some generalized abdominal tenderness that is worse in the left lower quadrant.  No CVA tenderness.  Given history of chronic black tarry stools, will plan for rectal.  Additionally, she states that she feels like she has some rectal pain.  Will do rectal exam for evaluation of perirectal abscess.  NSAID or infectious process versus chronic abdominal pain/vomiting.  Also consider of diverticulitis.  Low suspicion for GI bleed given history/physical exam.  Rectal as documented above.  No melena noted on exam.  No mass noted to palpation.  Fecal occult negative.  Lipase unremarkable.  CMP is unremarkable.  CBC without any significant leukocytosis or anemia.  UA with small amount of ketones.  Otherwise unremarkable.  Given that she has this history of weight loss as well as night sweats as well as pain, will plan for CT abd/pelvis.  CT abdomen pelvis shows 2 cm cyst in the caudal liver.  This was  previously seen in her other CT scans.  Additionally, she has evidence of acute diverticulitis in the sigmoid colon.  No evidence of abscess or bowel obstruction.  Discussed results with patient.  She is resting comfortably here in the ED and is not required any pain medication here in the emergency department.  No vomiting.  Vital signs are stable.  At this time, given reassuring work-up and overall well appearance, I feel that her diverticulitis can be managed on an outpatient basis.  Patient allergic to Topamax and etodolac.  We will plan to treat with Augmentin.  She does have GI that she follows with.  Instructed her to follow-up with her GI doctor. At this time, patient exhibits no emergent life-threatening condition that require further evaluation in ED or admission. Patient had ample opportunity for questions and discussion. All patient's questions were answered with full understanding. Strict return precautions discussed. Patient expresses understanding and agreement to plan.   Portions of this note were generated with [employer]. Dictation errors may occur despite best attempts at proofreading.   Final Clinical Impressions(s) / ED Diagnoses   Final diagnoses:  Diverticulitis    ED Discharge Orders         Ordered    HYDROcodone-acetaminophen (NORCO/VICODIN) 5-325 MG tablet  Every 6 hours PRN     12/06/18 2059    ondansetron (ZOFRAN ODT) 4 MG disintegrating tablet  Every 8 hours PRN     12/06/18 2059    amoxicillin-clavulanate (AUGMENTIN) 875-125 MG tablet  2 times daily     12/06/18 2059           Desma Mcgregor 12/06/18 2104    Drenda Freeze, MD 12/10/18 1105

## 2018-12-07 ENCOUNTER — Ambulatory Visit
Admission: RE | Admit: 2018-12-07 | Discharge: 2018-12-07 | Disposition: A | Discharge status: Home / Self Care | Payer: BC Managed Care – PPO | Source: Ambulatory Visit | Attending: Physician Assistant | Admitting: Physician Assistant

## 2018-12-07 DIAGNOSIS — R1115 Cyclical vomiting syndrome unrelated to migraine: Secondary | ICD-10-CM

## 2018-12-10 ENCOUNTER — Other Ambulatory Visit: Payer: Medicare Other

## 2018-12-20 ENCOUNTER — Encounter (HOSPITAL_BASED_OUTPATIENT_CLINIC_OR_DEPARTMENT_OTHER): Payer: Self-pay | Admitting: Adult Health

## 2018-12-20 ENCOUNTER — Emergency Department (HOSPITAL_BASED_OUTPATIENT_CLINIC_OR_DEPARTMENT_OTHER)
Admission: EM | Admit: 2018-12-20 | Discharge: 2018-12-20 | Disposition: A | Discharge status: Home / Self Care | Payer: BC Managed Care – PPO | Attending: Emergency Medicine | Admitting: Emergency Medicine

## 2018-12-20 ENCOUNTER — Other Ambulatory Visit: Payer: Self-pay

## 2018-12-20 DIAGNOSIS — Z79899 Other long term (current) drug therapy: Secondary | ICD-10-CM | POA: Insufficient documentation

## 2018-12-20 DIAGNOSIS — R1032 Left lower quadrant pain: Secondary | ICD-10-CM | POA: Diagnosis present

## 2018-12-20 DIAGNOSIS — Z87891 Personal history of nicotine dependence: Secondary | ICD-10-CM | POA: Insufficient documentation

## 2018-12-20 DIAGNOSIS — K5792 Diverticulitis of intestine, part unspecified, without perforation or abscess without bleeding: Secondary | ICD-10-CM | POA: Diagnosis not present

## 2018-12-20 DIAGNOSIS — Z792 Long term (current) use of antibiotics: Secondary | ICD-10-CM | POA: Diagnosis not present

## 2018-12-20 HISTORY — DX: Diverticulitis of intestine, part unspecified, without perforation or abscess without bleeding: K57.92

## 2018-12-20 LAB — CBC
HCT: 39.1 % (ref 36.0–46.0)
Hemoglobin: 12.6 g/dL (ref 12.0–15.0)
MCH: 29 pg (ref 26.0–34.0)
MCHC: 32.2 g/dL (ref 30.0–36.0)
MCV: 89.9 fL (ref 80.0–100.0)
Platelets: 269 10*3/uL (ref 150–400)
RBC: 4.35 MIL/uL (ref 3.87–5.11)
RDW: 12.8 % (ref 11.5–15.5)
WBC: 6.5 10*3/uL (ref 4.0–10.5)
nRBC: 0 % (ref 0.0–0.2)

## 2018-12-20 LAB — COMPREHENSIVE METABOLIC PANEL
ALT: 11 U/L (ref 0–44)
AST: 16 U/L (ref 15–41)
Albumin: 3.8 g/dL (ref 3.5–5.0)
Alkaline Phosphatase: 43 U/L (ref 38–126)
Anion gap: 11 (ref 5–15)
BUN: 9 mg/dL (ref 8–23)
CO2: 21 mmol/L — ABNORMAL LOW (ref 22–32)
Calcium: 9.3 mg/dL (ref 8.9–10.3)
Chloride: 105 mmol/L (ref 98–111)
Creatinine, Ser: 0.68 mg/dL (ref 0.44–1.00)
GFR calc Af Amer: >60 mL/min (ref >60)
GFR calc non Af Amer: >60 mL/min (ref >60)
Glucose, Bld: 186 mg/dL — ABNORMAL HIGH (ref 70–99)
Potassium: 3.2 mmol/L — ABNORMAL LOW (ref 3.5–5.1)
Sodium: 137 mmol/L (ref 135–145)
Total Bilirubin: 0.4 mg/dL (ref 0.3–1.2)
Total Protein: 6.8 g/dL (ref 6.5–8.1)

## 2018-12-20 LAB — LIPASE, BLOOD: Lipase: 19 U/L (ref 11–51)

## 2018-12-20 MED ORDER — HYDROCODONE-ACETAMINOPHEN 5-325 MG PO TABS: 1.0000 | ORAL_TABLET | Freq: Four times a day (QID) | ORAL | 0 refills | Status: DC | PRN

## 2018-12-20 MED ORDER — SODIUM CHLORIDE 0.9% FLUSH
3.0000 mL | Freq: Once | INTRAVENOUS | Status: DC
  Filled 2018-12-20: qty 3

## 2018-12-20 MED ORDER — OXYCODONE-ACETAMINOPHEN 5-325 MG PO TABS: 1.0000 | ORAL_TABLET | Freq: Four times a day (QID) | ORAL | 0 refills | Status: DC | PRN

## 2018-12-20 NOTE — ED Triage Notes (Signed)
Jennifer Franco presents with lower abdominal pain and a 13 pound weight loss over the past 2 weeks. She was here 2 weeks ago and dx with diverticulitis, she was feeling better after taking her antibiotics, but this episode began one day ago that is described as severe lower left abdominal pain. She has a gastroenterologist appointment tomorrow. She is also having severe rectal pain that is new. She de4scribes the rectal pain as contractions. She states her stools are also ribbon like and dark.

## 2018-12-20 NOTE — ED Notes (Signed)
Pt verbalized understanding of dc instructions.

## 2018-12-20 NOTE — ED Provider Notes (Signed)
[facility] EMERGENCY DEPARTMENT Provider Note   CSN: [id number] Arrival date & time: 12/20/18  1513     History   Chief Complaint Chief Complaint  Patient presents with  . Abdominal Pain    HPI Jennifer Franco is a 63 y.o. female.     HPI Patient reports that she has known diverticulitis.  She was diagnosed 2 weeks ago on CT scan.  She reports that she took her antibiotics and got significantly better.  She states at that time within 3 days there was dramatic improvement.  She reports however after she finished antibiotics as of yesterday she started to get pain again that was pretty severe in the left lower quadrant.  She had been calling into her GI doctor's office and does have an appointment scheduled tomorrow.  She reports that she restarted the Augmentin and today is already feeling significantly better than yesterday.  She has not developed any fever.  She reports she is having some pain with bowel movements.  Quality is cramping in nature.  She denies that the pain is of the severity she would expect with perforation or abscess formation.  Ports that she was told by the office when she scheduled her appointment to come to the emergency department today to get a CT scan. Past Medical History:  Diagnosis Date  . Arthritis   . Diverticulitis   . Sjogren's syndrome ([place])     There are no active problems to display for this patient.   Past Surgical History:  Procedure Laterality Date  . ADENOIDECTOMY    . CARPAL TUNNEL RELEASE Right   . CESAREAN SECTION    . CESAREAN SECTION       OB History   No obstetric history on file.      Home Medications    Prior to Admission medications   Medication Sig Start Date End Date Taking? Authorizing Provider  b complex vitamins tablet Take 1 tablet by mouth daily.     [provider]  Calcium Carb-Cholecalciferol (CALCIUM-VITAMIN D) 500-200 MG-UNIT tablet Take 1 tablet by mouth daily.     [provider]  Efinaconazole (JUBLIA) 10 % SOLN Apply 1 application topically daily.    [provider]  folic acid (FOLVITE) 1 MG tablet Take 1 mg by mouth daily.  04/06/15   [provider]  HYDROcodone-acetaminophen (NORCO/VICODIN) 5-325 MG tablet Take 1-2 tablets by mouth every 6 (six) hours as needed. 12/06/18   Volanda Napoleon, PA-C  HYDROcodone-acetaminophen (NORCO/VICODIN) 5-325 MG tablet Take 1-2 tablets by mouth every 6 (six) hours as needed for moderate pain or severe pain. 12/20/18   Charlesetta Shanks, MD  hydroxychloroquine (PLAQUENIL) 200 MG tablet Take 200 mg by mouth daily.  03/26/10   [provider]  iron polysaccharides (NIFEREX) 150 MG capsule Take 150 mg by mouth daily.  03/26/10   [provider]  [name] Oil 500 MG CAPS Take 500 mg by mouth daily.    [provider]  levothyroxine (SYNTHROID, LEVOTHROID) 150 MCG tablet Take 150 mcg by mouth daily before breakfast.    [provider]  Olive Leaf Extract 250 MG CAPS Take 250 capsules by mouth daily.     [provider]  ondansetron (ZOFRAN ODT) 4 MG disintegrating tablet Take 1 tablet (4 mg total) by mouth every 8 (eight) hours as needed for nausea or vomiting. 12/06/18   Volanda Napoleon, PA-C  polycarbophil (FIBERCON) 625 MG tablet Take 625 mg by mouth  daily.    [provider]  Potassium 99 MG TABS Take 99 mg by mouth daily.  03/26/10   [provider]  promethazine (PHENERGAN) 25 MG tablet Take 1 tablet (25 mg total) by mouth every 6 (six) hours as needed for nausea or vomiting. 12/16/17   Montine Circle, PA-C    Family History Family History  Problem Relation Age of Onset  . Breast cancer Mother 26  . Breast cancer Other 49       Maternal Great Grandmother    Social History Social History   Tobacco Use  . Smoking status: Former Research scientist (life sciences)  . Smokeless tobacco: Never Used  Substance Use Topics  . Alcohol use: Yes    Comment: occ  .  Drug use: No     Allergies   Etodolac and Topamax [topiramate]   Review of Systems Review of Systems 10 Systems reviewed and are negative for acute change except as noted in the HPI.  Physical Exam Updated Vital Signs BP 129/85 (BP Location: Right Arm)   Pulse 93   Temp 98.4 F (36.9 C) (Oral)   Resp 18   Ht 5\' 3"  (1.6 m)   Wt 73.5 kg   SpO2 98%   BMI 28.70 kg/m   Physical Exam Constitutional:      Appearance: Normal appearance.     Comments: Patient is alert nontoxic.  Clinically well in appearance.  Cardiovascular:     Rate and Rhythm: Normal rate and regular rhythm.  Pulmonary:     Effort: Pulmonary effort is normal.     Breath sounds: Normal breath sounds.  Abdominal:     Comments: Abdomen is soft.  Patient does not have any guarding.  Upper abdomen is completely nontender.  Patient does have moderate tenderness in the infraumbilical and midline area.  The left lower quadrant is at this time only mildly tender.  Musculoskeletal: Normal range of motion.        General: No swelling or tenderness.  Skin:    General: Skin is warm and dry.  Neurological:     General: No focal deficit present.     Mental Status: She is alert and oriented to person, place, and time.     Coordination: Coordination normal.  Psychiatric:        Mood and Affect: Mood normal.      ED Treatments / Results  Labs (all labs ordered are listed, but only abnormal results are displayed) Labs Reviewed  COMPREHENSIVE METABOLIC PANEL - Abnormal; Notable for the following components:      Result Value   Potassium 3.2 (*)    CO2 21 (*)    Glucose, Bld 186 (*)    All other components within normal limits  LIPASE, BLOOD  CBC  URINALYSIS, ROUTINE W REFLEX MICROSCOPIC    EKG None  Radiology No results found.  Procedures Procedures (including critical care time)  Medications Ordered in ED Medications  sodium chloride flush (NS) 0.9 % injection 3 mL (3 mLs Intravenous Not Given  12/20/18 1542)     Initial Impression / Assessment and Plan / ED Course  I have reviewed the triage vital signs and the nursing notes.  Pertinent labs & imaging results that were available during my care of the patient were reviewed by me and considered in my medical decision making (see chart for details).       Patient is alert and appropriate.  She had prior diverticulitis with CT done 13 days ago.  There  was segmental thickening of the sigmoid colon.  Patient reports that she was within 3 days dramatically improved on antibiotics.  She had a recrudescence of symptoms within the past 2 days.  She did restart Augmentin and reports that she is already noting improvement today relative to yesterday.  Patient has not developed fever or leukocytosis, she does not have any vital sign abnormalities..  Her abdominal examination has moderate midline tenderness but no guarding and minimal left lower quadrant tenderness.  At this time we discussed the advantages and disadvantages of proceeding with CT scan based on today's clinical exam.  I did feel that as the patient has follow-up with GI tomorrow, it would be better to complete another day of antibiotics and get reassessed before proceeding to CT scan because on exam I have lower suspicion for acute abscess or perforation.  His symptoms just started within the past couple days and are already improving.  This would make the third his CT scan in 8 months.  Felt that and the risk benefit analysis it would be better to continue with some clinical treatment and reserve subsequent CT scan for progressive worsening of symptoms or additional necessities around schedule surveillance based on Clinical findings with GI.  Return precautions reviewed  Final Clinical Impressions(s) / ED Diagnoses   Final diagnoses:  Diverticulitis    ED Discharge Orders         Ordered    HYDROcodone-acetaminophen (NORCO/VICODIN) 5-325 MG tablet  Every 6 hours PRN     12/20/18  1644           Charlesetta Shanks, MD 12/20/18 1658

## 2018-12-21 DIAGNOSIS — R634 Abnormal weight loss: Secondary | ICD-10-CM | POA: Diagnosis not present

## 2018-12-21 DIAGNOSIS — K5732 Diverticulitis of large intestine without perforation or abscess without bleeding: Secondary | ICD-10-CM | POA: Diagnosis not present

## 2018-12-21 DIAGNOSIS — Z86010 Personal history of colonic polyps: Secondary | ICD-10-CM | POA: Diagnosis not present

## 2019-01-25 DIAGNOSIS — K210 Gastro-esophageal reflux disease with esophagitis: Secondary | ICD-10-CM | POA: Diagnosis not present

## 2019-01-25 DIAGNOSIS — K5732 Diverticulitis of large intestine without perforation or abscess without bleeding: Secondary | ICD-10-CM | POA: Diagnosis not present

## 2019-01-25 DIAGNOSIS — R634 Abnormal weight loss: Secondary | ICD-10-CM | POA: Diagnosis not present

## 2019-01-25 DIAGNOSIS — Z86010 Personal history of colonic polyps: Secondary | ICD-10-CM | POA: Diagnosis not present

## 2019-01-25 DIAGNOSIS — R198 Other specified symptoms and signs involving the digestive system and abdomen: Secondary | ICD-10-CM | POA: Diagnosis not present

## 2019-01-29 ENCOUNTER — Other Ambulatory Visit: Payer: Self-pay | Admitting: Physician Assistant

## 2019-01-29 ENCOUNTER — Ambulatory Visit
Admission: RE | Admit: 2019-01-29 | Discharge: 2019-01-29 | Disposition: A | Discharge status: Home / Self Care | Payer: BC Managed Care – PPO | Source: Ambulatory Visit | Attending: Physician Assistant | Admitting: Physician Assistant

## 2019-01-29 DIAGNOSIS — K5732 Diverticulitis of large intestine without perforation or abscess without bleeding: Secondary | ICD-10-CM

## 2019-01-29 DIAGNOSIS — R1032 Left lower quadrant pain: Secondary | ICD-10-CM

## 2019-01-29 MED ORDER — IOPAMIDOL (ISOVUE-300) INJECTION 61%
100.0000 mL | Freq: Once | INTRAVENOUS | Status: AC | PRN
  Administered 2019-01-29: 100 mL via INTRAVENOUS

## 2019-02-05 DIAGNOSIS — R110 Nausea: Secondary | ICD-10-CM | POA: Diagnosis not present

## 2019-02-05 DIAGNOSIS — R634 Abnormal weight loss: Secondary | ICD-10-CM | POA: Diagnosis not present

## 2019-02-05 DIAGNOSIS — K5732 Diverticulitis of large intestine without perforation or abscess without bleeding: Secondary | ICD-10-CM | POA: Diagnosis not present

## 2019-03-06 DIAGNOSIS — M3500 Sicca syndrome, unspecified: Secondary | ICD-10-CM | POA: Diagnosis not present

## 2019-03-06 DIAGNOSIS — E039 Hypothyroidism, unspecified: Secondary | ICD-10-CM | POA: Diagnosis not present

## 2019-03-06 DIAGNOSIS — Z79899 Other long term (current) drug therapy: Secondary | ICD-10-CM | POA: Diagnosis not present

## 2019-03-08 DIAGNOSIS — R1115 Cyclical vomiting syndrome unrelated to migraine: Secondary | ICD-10-CM | POA: Diagnosis not present

## 2019-03-20 DIAGNOSIS — L600 Ingrowing nail: Secondary | ICD-10-CM | POA: Diagnosis not present

## 2019-03-20 DIAGNOSIS — M79674 Pain in right toe(s): Secondary | ICD-10-CM | POA: Diagnosis not present

## 2019-03-20 DIAGNOSIS — M79675 Pain in left toe(s): Secondary | ICD-10-CM | POA: Diagnosis not present

## 2019-03-26 DIAGNOSIS — M3500 Sicca syndrome, unspecified: Secondary | ICD-10-CM | POA: Diagnosis not present

## 2019-03-26 DIAGNOSIS — E039 Hypothyroidism, unspecified: Secondary | ICD-10-CM | POA: Diagnosis not present

## 2019-03-26 DIAGNOSIS — D649 Anemia, unspecified: Secondary | ICD-10-CM | POA: Diagnosis not present

## 2019-03-26 DIAGNOSIS — Z0000 Encounter for general adult medical examination without abnormal findings: Secondary | ICD-10-CM | POA: Diagnosis not present

## 2019-03-26 DIAGNOSIS — R112 Nausea with vomiting, unspecified: Secondary | ICD-10-CM | POA: Diagnosis not present

## 2019-03-26 DIAGNOSIS — F39 Unspecified mood [affective] disorder: Secondary | ICD-10-CM | POA: Diagnosis not present

## 2019-03-26 DIAGNOSIS — M069 Rheumatoid arthritis, unspecified: Secondary | ICD-10-CM | POA: Diagnosis not present

## 2019-03-26 DIAGNOSIS — Z86010 Personal history of colonic polyps: Secondary | ICD-10-CM | POA: Diagnosis not present

## 2019-03-26 DIAGNOSIS — B351 Tinea unguium: Secondary | ICD-10-CM | POA: Diagnosis not present

## 2019-03-26 DIAGNOSIS — Z23 Encounter for immunization: Secondary | ICD-10-CM | POA: Diagnosis not present

## 2019-03-26 DIAGNOSIS — Z13220 Encounter for screening for lipoid disorders: Secondary | ICD-10-CM | POA: Diagnosis not present

## 2019-03-26 DIAGNOSIS — K2100 Gastro-esophageal reflux disease with esophagitis, without bleeding: Secondary | ICD-10-CM | POA: Diagnosis not present

## 2019-03-27 NOTE — Progress Notes (Signed)
Cardiology Office Note:    Date:  03/28/2019   ID:  Jennifer Franco, DOB 1955/11/07, MRN [id number]  PCP:  Kelton Pillar, MD  Cardiologist:  No primary care provider on file.   Referring MD: Kelton Pillar, MD   Chief Complaint  Patient presents with  . Irregular Heart Beat    Palpitations    History of Present Illness:    Jennifer Franco is a 63 y.o. female with a hx of  Palpitations referred for cardiology consultation by Dr. Kelton Pillar.  Of the background medical problems include rheumatoid arthritis, Sjogren's syndrome, depression, and hypothyroidism.  Here for cardiac evaluation because of sensations that her heart was pounding at different times off and on over the past summer.  During this time she has had significant difficulty with recurring abdominal pain, nausea, vomiting, diaphoresis, and weight loss.  More significantly, she has lost more than 150 pounds over the last several years.  This is been purposeful.  There is a recently diagnosed history of diverticulosis.  She may have had diverticulitis earlier in the summer.  She has an upcoming colonoscopy with Dr. Cristina Gong.  She will be seeing a gastroenterologist that [facility] for further input concerning nausea, vomiting, and abdominal pain.  The patient notes that a relationship existed between nausea, vomiting, and heart pounding episodes.  The pounding heart never occurred in absence of these other complaints or in the absence of feeling sick.  She is active, able to climb stairs without dyspnea.  She has not had tachycardia.  The pounding sensation is usually had what sounds like a normal heart rate but she is more aware of her heart beating.  Risk factors for vascular disease include family history of MI (father who was a smoker) and hyperlipidemia.  She was previously significantly obese.  She has lost 170 pounds in the last several years.  She is now concerned about  losing too much weight.  She is under stress and in the process of being divorced from her husband.  Past Medical History:  Diagnosis Date  . Anemia   . Chills   . CTS (carpal tunnel syndrome)    LEFT  . Cyclical vomiting   . Depression   . Diarrhea, unspecified   . Diverticulitis   . Fibromyalgia   . Hemorrhoids   . History of adenomatous polyp of colon   . History of morbid obesity   . Hypothyroid   . Hypothyroidism   . Intractable nausea and vomiting   . Irregular bowel habits   . Joint pain   . LLQ abdominal pain   . Morbid obesity ([place])   . Nausea and vomiting   . Onychomycosis   . RA (rheumatoid arthritis) ([place])   . Reflux esophagitis   . Reflux esophagitis   . Sjogren's syndrome ([place])   . Tinea corporis   . Unspecified mood (affective) disorder ([place])   . Weight loss     Past Surgical History:  Procedure Laterality Date  . ADENOIDECTOMY    . CARPAL TUNNEL RELEASE Right   . CESAREAN SECTION    . CESAREAN SECTION    . wisdom teeth      Current Medications: Current Meds  Medication Sig  . b complex vitamins tablet Take 1 tablet by mouth daily.   . Calcium Carb-Cholecalciferol (CALCIUM-VITAMIN D) 500-200 MG-UNIT tablet Take 1 tablet by mouth daily.   . folic acid (FOLVITE) 1 MG tablet Take 1 mg by mouth  daily.   Marland Kitchen HYDROcodone-acetaminophen (NORCO/VICODIN) 5-325 MG tablet Take 1-2 tablets by mouth every 6 (six) hours as needed.  Marland Kitchen ibuprofen (ADVIL) 800 MG tablet Take 800 mg by mouth every 8 (eight) hours as needed.  . iron polysaccharides (NIFEREX) 150 MG capsule Take 150 mg by mouth daily.   [name] Oil 500 MG CAPS Take 500 mg by mouth daily.  Marland Kitchen levothyroxine (SYNTHROID) 125 MCG tablet Take 125 mcg by mouth daily.  [name] Leaf Extract 250 MG CAPS Take 250 capsules by mouth daily.   Marland Kitchen omeprazole (PRILOSEC) 20 MG capsule Take 20 mg by mouth 2 (two) times daily before a meal.   . ondansetron (ZOFRAN ODT) 4 MG disintegrating tablet Take 1 tablet (4 mg total) by  mouth every 8 (eight) hours as needed for nausea or vomiting.  Marland Kitchen oxyCODONE-acetaminophen (PERCOCET) 5-325 MG tablet Take 1-2 tablets by mouth every 6 (six) hours as needed.  . polycarbophil (FIBERCON) 625 MG tablet Take 625 mg by mouth daily.  . Potassium 99 MG TABS Take 99 mg by mouth daily.   . promethazine (PHENERGAN) 25 MG tablet Take 1 tablet (25 mg total) by mouth every 6 (six) hours as needed for nausea or vomiting.  . sucralfate (CARAFATE) 1 g tablet Take 1 tablet by mouth 2 (two) times daily.  . [DISCONTINUED] levothyroxine (SYNTHROID, LEVOTHROID) 150 MCG tablet Take 150 mcg by mouth daily before breakfast.     Allergies:   Etodolac and Topamax [topiramate]   Social History   Socioeconomic History  . Marital status: Legally Separated    Spouse name: Not on file  . Number of children: Not on file  . Years of education: Not on file  . Highest education level: Not on file  Occupational History  . Not on file  Social Needs  . Financial resource strain: Not on file  . Food insecurity    Worry: Not on file    Inability: Not on file  . Transportation needs    Medical: Not on file    Non-medical: Not on file  Tobacco Use  . Smoking status: Former Research scientist (life sciences)  . Smokeless tobacco: Never Used  Substance and Sexual Activity  . Alcohol use: Yes    Comment: occ  . Drug use: No  . Sexual activity: Not on file  Lifestyle  . Physical activity    Days per week: Not on file    Minutes per session: Not on file  . Stress: Not on file  Relationships  . Social Herbalist on phone: Not on file    Gets together: Not on file    Attends religious service: Not on file    Active member of club or organization: Not on file    Attends meetings of clubs or organizations: Not on file    Relationship status: Not on file  Other Topics Concern  . Not on file  Social History Narrative  . Not on file     Family History: The patient's family history includes Alzheimer's disease in  her father; Breast cancer (age of onset: 60) in an other family member; Colon polyps in her maternal grandfather, maternal grandmother, and mother; GER disease in her mother; Glaucoma in her mother; Osteoarthritis in her father.  ROS:   Please see the history of present illness.    Seems anxious.  Does not sleep well.  Having recurring abdominal pain as mentioned before.  She is a friend of Greer Pickerel who is  her surrogate mother here in [place].  Her father is deceased and was a Education officer, environmental in [place].  His brother was also a Psychologist, sport and exercise.  There is a prior cardiac work-up greater than 15 years ago which she said was on remarkable and carried out by me.  All other systems reviewed and are negative.  EKGs/Labs/Other Studies Reviewed:    The following studies were reviewed today: No new cardiac data   EKG:  EKG normal sinus rhythm, relatively low voltage, poor R wave progression V1 through V3.  Recent Labs: 12/20/2018: ALT 11; BUN 9; Creatinine, Ser 0.68; Hemoglobin 12.6; Platelets 269; Potassium 3.2; Sodium 137  Recent Lipid Panel No results found for: CHOL, TRIG, HDL, CHOLHDL, VLDL, LDLCALC, LDLDIRECT  Physical Exam:    VS:  BP 124/62   Pulse 63   Ht 5\' 3"  (1.6 m)   Wt 153 lb 12.8 oz (69.8 kg)   SpO2 98%   BMI 27.24 kg/m     Wt Readings from Last 3 Encounters:  03/28/19 153 lb 12.8 oz (69.8 kg)  12/20/18 162 lb (73.5 kg)  12/06/18 163 lb (73.9 kg)     GEN: Relatively healthy-appearing.  Wearing a mask.. No acute distress HEENT: Normal NECK: No JVD. LYMPHATICS: No lymphadenopathy CARDIAC:  RRR without murmur, gallop, or edema. VASCULAR:  Normal Pulses. No bruits. RESPIRATORY:  Clear to auscultation without rales, wheezing or rhonchi  ABDOMEN: Soft, non-tender, non-distended, No pulsatile mass, MUSCULOSKELETAL: No deformity  SKIN: Warm and dry NEUROLOGIC:  Alert and oriented x 3 PSYCHIATRIC:  Normal affect   ASSESSMENT:    1. Palpitations   2. Rheumatoid  arthritis of other site, unspecified whether rheumatoid factor present ([place])   3. Sjogren's syndrome, with unspecified organ involvement ([place])   4. 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 Educated about COVID-19 virus infection    PLAN:    In order of problems listed above:  1. She is not having arrhythmia.  Rather, she is aware of her heart beating and this is usually in the setting of nausea and vomiting.  I believe this is related to dynamic sympathetic and parasympathetic neural output.  This seems to be triggered by what ever the problem is in her abdomen. 2. I do not detect any clinical evidence of pericardial disease.  Neck veins are completely flat. 3. She is not having chest discomfort anything to suggest myocardial ischemia or pulmonary hypertension. 4. Not discussed. 5. The 3W's is being practiced.  I do not believe any further cardiac evaluation is needed.  I recommend watchful waiting.   Medication Adjustments/Labs and Tests Ordered: Current medicines are reviewed at length with the patient today.  Concerns regarding medicines are outlined above.  No orders of the defined types were placed in this encounter.  No orders of the defined types were placed in this encounter.   There are no Patient Instructions on file for this visit.   Signed, Sinclair Grooms, MD  03/28/2019 2:43 PM    [place]

## 2019-03-28 ENCOUNTER — Encounter: Payer: Self-pay | Admitting: Interventional Cardiology

## 2019-03-28 ENCOUNTER — Ambulatory Visit (INDEPENDENT_AMBULATORY_CARE_PROVIDER_SITE_OTHER): Payer: BC Managed Care – PPO | Admitting: Interventional Cardiology

## 2019-03-28 ENCOUNTER — Other Ambulatory Visit: Payer: Self-pay

## 2019-03-28 ENCOUNTER — Encounter (INDEPENDENT_AMBULATORY_CARE_PROVIDER_SITE_OTHER): Payer: Self-pay

## 2019-03-28 VITALS — BP 124/62 | HR 63 | Ht 63.0 in | Wt 153.8 lb

## 2019-03-28 DIAGNOSIS — Z7189 Other specified counseling: Secondary | ICD-10-CM

## 2019-03-28 DIAGNOSIS — R002 Palpitations: Secondary | ICD-10-CM

## 2019-03-28 DIAGNOSIS — M069 Rheumatoid arthritis, unspecified: Secondary | ICD-10-CM | POA: Diagnosis not present

## 2019-03-28 DIAGNOSIS — M3500 Sicca syndrome, unspecified: Secondary | ICD-10-CM

## 2019-03-28 DIAGNOSIS — E032 Hypothyroidism due to medicaments and other exogenous substances: Secondary | ICD-10-CM

## 2019-03-28 NOTE — Patient Instructions (Signed)
Medication Instructions:  Your physician recommends that you continue on your current medications as directed. Please refer to the Current Medication list given to you today.  *If you need a refill on your cardiac medications before your next appointment, please call your pharmacy*  Lab Work: None If you have labs (blood work) drawn today and your tests are completely normal, you will receive your results only by: . MyChart Message (if you have MyChart) OR . A paper copy in the mail If you have any lab test that is abnormal or we need to change your treatment, we will call you to review the results.  Testing/Procedures: None  Follow-Up: At [facility], you and your health needs are our priority.  As part of our continuing mission to provide you with exceptional heart care, we have created designated Provider Care Teams.  These Care Teams include your primary Cardiologist (physician) and Advanced Practice Providers (APPs -  Physician Assistants and Nurse Practitioners) who all work together to provide you with the care you need, when you need it.  Your next appointment:   As needed   The format for your next appointment:   In Person  Provider:   You may see Dr. Henry Smith or one of the following Advanced Practice Providers on your designated Care Team:    Lori Gerhardt, NP  Laura Ingold, NP  Jill McDaniel, NP   Other Instructions   

## 2019-04-02 ENCOUNTER — Other Ambulatory Visit: Payer: Self-pay | Admitting: Gastroenterology

## 2019-04-02 DIAGNOSIS — Z86010 Personal history of colonic polyps: Secondary | ICD-10-CM

## 2019-04-02 DIAGNOSIS — K625 Hemorrhage of anus and rectum: Secondary | ICD-10-CM

## 2019-04-02 DIAGNOSIS — Z8719 Personal history of other diseases of the digestive system: Secondary | ICD-10-CM

## 2019-04-03 ENCOUNTER — Other Ambulatory Visit: Payer: Self-pay | Admitting: Gastroenterology

## 2019-04-03 ENCOUNTER — Ambulatory Visit (HOSPITAL_COMMUNITY)
Admission: RE | Admit: 2019-04-03 | Discharge: 2019-04-03 | Disposition: A | Discharge status: Home / Self Care | Payer: BC Managed Care – PPO | Source: Ambulatory Visit | Attending: Gastroenterology | Admitting: Gastroenterology

## 2019-04-03 DIAGNOSIS — Z8719 Personal history of other diseases of the digestive system: Secondary | ICD-10-CM

## 2019-04-03 DIAGNOSIS — K625 Hemorrhage of anus and rectum: Secondary | ICD-10-CM | POA: Diagnosis not present

## 2019-04-03 DIAGNOSIS — Z86010 Personal history of colonic polyps: Secondary | ICD-10-CM | POA: Diagnosis not present

## 2019-04-08 ENCOUNTER — Other Ambulatory Visit: Payer: Self-pay

## 2019-04-10 DIAGNOSIS — R110 Nausea: Secondary | ICD-10-CM | POA: Diagnosis not present

## 2019-04-10 DIAGNOSIS — E039 Hypothyroidism, unspecified: Secondary | ICD-10-CM | POA: Diagnosis not present

## 2019-04-10 DIAGNOSIS — R634 Abnormal weight loss: Secondary | ICD-10-CM | POA: Diagnosis not present

## 2019-04-10 DIAGNOSIS — K56699 Other intestinal obstruction unspecified as to partial versus complete obstruction: Secondary | ICD-10-CM | POA: Diagnosis not present

## 2019-04-10 DIAGNOSIS — R194 Change in bowel habit: Secondary | ICD-10-CM | POA: Diagnosis not present

## 2019-04-10 DIAGNOSIS — R112 Nausea with vomiting, unspecified: Secondary | ICD-10-CM | POA: Diagnosis not present

## 2019-04-10 DIAGNOSIS — Z79890 Hormone replacement therapy (postmenopausal): Secondary | ICD-10-CM | POA: Diagnosis not present

## 2019-04-10 DIAGNOSIS — Z79899 Other long term (current) drug therapy: Secondary | ICD-10-CM | POA: Diagnosis not present

## 2019-04-10 DIAGNOSIS — K219 Gastro-esophageal reflux disease without esophagitis: Secondary | ICD-10-CM | POA: Diagnosis not present

## 2019-04-10 DIAGNOSIS — Z6825 Body mass index (BMI) 25.0-25.9, adult: Secondary | ICD-10-CM | POA: Diagnosis not present

## 2019-04-10 DIAGNOSIS — R109 Unspecified abdominal pain: Secondary | ICD-10-CM | POA: Diagnosis not present

## 2019-04-10 DIAGNOSIS — K5790 Diverticulosis of intestine, part unspecified, without perforation or abscess without bleeding: Secondary | ICD-10-CM | POA: Diagnosis not present

## 2019-04-10 DIAGNOSIS — R1110 Vomiting, unspecified: Secondary | ICD-10-CM | POA: Diagnosis not present

## 2019-05-20 ENCOUNTER — Other Ambulatory Visit: Payer: Self-pay | Admitting: Urology

## 2019-05-24 ENCOUNTER — Ambulatory Visit: Payer: Self-pay | Admitting: Surgery

## 2019-05-24 NOTE — H&P (Signed)
CC: Referred by Dr. Cristina Gong for possible sigmoid/diverticular stricture  HPI: [name] is a very pelasant 52yoF with hx of HTN, hypothyroidism, GERD, Sjogren syndrome, fibromyalgia, who is referred today for evaluation of possible sigmoidal/diverticular stricture. She has a long-standing known history of diverticulosis but has also had recurrent left lower quadrant abdominal pains over the last year. Her most significant "attack" occurred in July of this year. She reports that 4 years ago she weighed 310 pounds and has been steadily intentionally losing 1-2 pounds per month for the last 4 years. She did this by altering her diet, decreasing her portion size, and eating a diet consisting of protein with significant amounts of fruits and vegetables. Her weight loss was steady and she had no abdominal complaints, pains, nausea/vomiting. When she developed her attack of pain in July, she did have significant crampy pains lasted for almost a month associated with intermittent nausea and vomiting. Over the ensuing one to 2 months, these subsequently abated. Things have been the best they have been for the last month now since July. She denies any issues at this point with fever/chills/nausea/vomiting. She is supplementing her diet with protein shakes.  Last albumin checked was August 2020 and was noted to be 3.8. She had a CT scan abdomen/pelvis done 1 year ago which demonstrated diverticulosis without evidence of superimposed diverticulitis. Incidentally noted 2.6 cm left-sided adnexal cyst. This was "almost certainly benign" but follow-up imaging was recommended.  12/06/2018 she had a repeat CT abdomen and pelvis which showed findings compatible with acute diverticulitis of the sigmoid colon. No abscess or bowel obstruction. She had another CT scan performed 01/29/2019 of her abdomen and pelvis which showed moderate sigmoid diverticulosis with several changes in the pericolonic fat adjacent  to the sigmoid in the upper midline pelvis which may be partially chronic although mild acute diverticulitis is possible. Mild to moderate amount of free fluid in the pelvis. No perforation. 2 small liver hypodensities unchanged.  She has been followed with Dr. Cristina Gong whom recently attempted colonoscopy on her. He noted significant tortuosity and angulation of the rectosigmoid colon precluding visualization passed 18 cm. He tried with the UltraSling scope as well as with water insufflation but was unsuccessful. He ordered a barium enema 04/03/2019 which showed this to be a relatively limited study because unable to reflux contrast beyond the distal sigmoid colon. There was an area of severe stricturing at the rectosigmoid junction which was felt to be a possible neoplasm versus severe scarring from chronic diverticular disease.  No mass has been visualized on her CT scans. There is no clear intraluminal mass to the proximal most extent that the scope was performed.  PMH:HTN, hypothyroidism (well controlled on synthroid), GERD (partially controlled with PPI), Sjogren syndrome, fibromyalgia  PSH: C-section 2 via Pfannenstiel; tubal ligation  FHx: Denies FHx of malignancy  Social: Denies use of tobacco/EtOH/drugs. Her father was a Education officer, environmental whom passed away 08-29-2006  ROS: A comprehensive 10 system review of systems was completed with the patient and pertinent findings as noted above.  The patient is a 64 year old female.   Past Surgical History (Chanel Teressa Senter, [place]; 04/22/2019 1:53 PM) Cesarean Section - Multiple  Colon Polyp Removal - Colonoscopy  Oral Surgery   Diagnostic Studies History (Chanel Teressa Senter, CMA; 04/22/2019 1:53 PM) Colonoscopy  within last year Mammogram  within last year Pap Smear  1-5 years ago  Allergies (Chanel Teressa Senter, CMA; 04/22/2019 1:54 PM) Etodolac *ANALGESICS - ANTI-INFLAMMATORY*  Topamax *ANTICONVULSANTS*  Allergies  Reconciled   Medication  History (Chanel Teressa Senter, CMA; 04/22/2019 1:56 PM) Jublia (10% Solution, External) Active. Levothyroxine Sodium (125MCG Tablet, Oral) Active. Omeprazole (20MG  Capsule DR, Oral) Active. Ondansetron (4MG  Tablet Disint, Oral) Active. B Complex (Oral) Active. Calcium (Oral) Specific strength unknown - Active. Iron (Oral) Specific strength unknown - Active. Krill Oil (500MG  Capsule, Oral) Active. Potassium (Oral) Specific strength unknown - Active. Plaquenil (200MG  Tablet, Oral) Active. Medications Reconciled  Social History Antonietta Jewel, CMA; 04/22/2019 1:53 PM) Alcohol use  Recently quit alcohol use. Caffeine use  Coffee, Tea. Illicit drug use  Prefer to discuss with provider. Tobacco use  Former smoker.  Family History ([place], [place]; 04/22/2019 1:53 PM) Arthritis  Father, Mother. Breast Cancer  Mother. Colon Polyps  Mother.  Pregnancy / Birth History Antonietta Jewel, [place]; 04/22/2019 1:53 PM) Age at menarche  33 years. Age of menopause  51-57 Contraceptive History  Oral contraceptives. Gravida  3 Length (months) of breastfeeding  7-12 Maternal age  47-40 Para  3  Other Problems (Chanel Teressa Senter, [place]; 04/22/2019 1:53 PM) Arthritis  Back Pain  Diverticulosis  Gastroesophageal Reflux Disease  Hemorrhoids  Inguinal Hernia  Other disease, cancer, significant illness  Thyroid Disease     Review of Systems Harrell Gave M. Uziel Covault MD; 04/22/2019 3:12 PM) General Present- Weight Loss. Not Present- Appetite Loss, Chills, Fatigue, Fever, Night Sweats and Weight Gain. Skin Present- Dryness and Rash. Not Present- Change in Wart/Mole, Hives, Jaundice, New Lesions, Non-Healing Wounds and Ulcer. HEENT Present- Wears glasses/contact lenses. Not Present- Earache, Hearing Loss, Hoarseness, Nose Bleed, Oral Ulcers, Ringing in the Ears, Seasonal Allergies, Sinus Pain, Sore Throat, Visual Disturbances and Yellow Eyes. Respiratory Not Present- Bloody sputum,  Chronic Cough, Difficulty Breathing, Snoring and Wheezing. Breast Not Present- Breast Mass, Breast Pain, Nipple Discharge and Skin Changes. Cardiovascular Present- Palpitations. Not Present- Chest Pain, Difficulty Breathing Lying Down, Leg Cramps, Rapid Heart Rate, Shortness of Breath and Swelling of Extremities. Gastrointestinal Present- Abdominal Pain, Change in Bowel Habits, Nausea, Rectal Pain and Vomiting. Not Present- Bloating, Bloody Stool, Chronic diarrhea, Constipation, Difficulty Swallowing, Excessive gas, Gets full quickly at meals, Hemorrhoids and Indigestion. Female Genitourinary Not Present- Frequency, Nocturia, Painful Urination, Pelvic Pain and Urgency. Musculoskeletal Present- Back Pain, Joint Pain and Joint Stiffness. Not Present- Muscle Pain, Muscle Weakness and Swelling of Extremities. Neurological Not Present- Decreased Memory, Fainting, Headaches, Numbness, Seizures, Tingling, Tremor, Trouble walking and Weakness. Psychiatric Present- Depression. Not Present- Anxiety, Bipolar, Change in Sleep Pattern, Fearful and Frequent crying. Endocrine Not Present- Cold Intolerance, Excessive Hunger, Hair Changes, Heat Intolerance, Hot flashes and New Diabetes. Hematology Not Present- Blood Thinners, Easy Bruising, Excessive bleeding, Gland problems, HIV and Persistent Infections.  Vitals (Tanisha A. Brown RMA; 04/22/2019 1:58 PM) 04/22/2019 1:58 PM Weight: 145.6 lb Height: 64in Body Surface Area: 1.71 m Body Mass Index: 24.99 kg/m  Temp.: 98.46F  Pulse: 86 (Regular)  BP: 128/82 (Sitting, Left Arm, Standard)       Physical Exam Harrell Gave M. Floriene Jeschke MD; 04/22/2019 3:13 PM) The physical exam findings are as follows: Note:Constitutional: No acute distress; conversant; wearing mask Eyes: Moist conjunctiva; no lid lag; anicteric sclerae; pupils equal and round Neck: Trachea midline; no palpable masses/thyromegaly Lungs: Normal respiratory effort; no tactile  fremitus CV: rrr; no palpable thrill; no pitting edema GI: Abdomen soft, nontender, nondistended; no palpable hepatosplenomegaly; excess skin consistent with stated weight loss MSK: Normal gait; no clubbing/cyanosis Psychiatric: Appropriate affect; alert and oriented 3 Lymphatic: No palpable cervical or axillary lymphadenopathy **A chaperone, [employer], was present  for this encounter    Assessment & Plan Harrell Gave M. Diante Barley MD; 04/22/2019 3:22 PM) DIVERTICULAR STRICTURE [id number]) Story: [name] is very pleasant 47yoF with his of HTN, hypothyroidism, GERD, Sjogren syndrome, fibromyalgia - here with what is most likely diverticular stricture given her symptoms and overall clinical picture  We had a long discussion today about her particular case. We discussed that her CT history showing diverticulitis with one year ago and again in July/September, suggests this to be a stricture of her rectosigmoid colon related to underlying/recurrent diverticulitis. Her symptoms actually improved in the last 2-3 months which clinically would go against this being a progressively enlarging luminal mass in something that is more inflammatory/edema-like in nature. Impression: -I spent time with her today going over her imaging with her and explaining her films. We had a long discussion that included question answering. -The anatomy and physiology of the GI tract was discussed at length with the patient. The pathophysiology of diverticulitis and strictures was discussed at length with associated pictures. -Given the severity of her symptoms, we discussed surgical options moving forward - robotic-assisted sigmoidectomy, possible conversion to open surgery, possible ostomy based on intraoperative findings, flexible sigmoidoscopy, cystoscopy/ureteral stents/firefly with urology -With her imaging findings and chronic appearing changes, we spent time emphasizing the potential for an ostomy with this  procedure including a colostomy which could be permanent. We discussed intraoperative findings that may result in this including significant retroperitoneal/pelvic fibrosis. She expressed understanding -The planned procedure, material risks (including, but not limited to, pain, bleeding, infection, scarring, need for blood transfusion, damage to surrounding structures- blood vessels/nerves/viscus/organs, damage to ureter, urine leak, leak from anastomosis, need for additional procedures, need for stoma which may be permanent, hernia, recurrence of diverticulitis, pneumonia, heart attack, stroke, death) benefits and alternatives to surgery were discussed at length. We discussed that long-term, without surgery, this can be progressive, and can ultimately result in obstruction of the GI tract. The patient's questions were answered to her satisfaction, she expressed understanding of all the above, and has elected to proceed with surgery. Counseling and coordination of care (all of which were face to face) represented >50% of the time spent with patient. Total time spent with patient and charting: 75 minutes  Signed by Ileana Roup, MD (04/22/2019 3:22 PM)

## 2019-06-12 ENCOUNTER — Other Ambulatory Visit: Payer: Self-pay | Admitting: Urology

## 2019-06-17 ENCOUNTER — Ambulatory Visit: Payer: Self-pay | Admitting: Surgery

## 2019-06-17 NOTE — H&P (Signed)
CC: Referred by Dr. Cristina Gong for possible sigmoid/diverticular stricture  HPI: [name] is a very pelasant 64yoF with hx of HTN, hypothyroidism, GERD, Sjogren syndrome, fibromyalgia, who is referred today for evaluation of possible sigmoidal/diverticular stricture. She has a long-standing known history of diverticulosis but has also had recurrent left lower quadrant abdominal pains over the last year. Her most significant "attack" occurred in July of this year. She reports that 4 years ago she weighed 310 pounds and has been steadily intentionally losing 1-2 pounds per month for the last 4 years. She did this by altering her diet, decreasing her portion size, and eating a diet consisting of protein with significant amounts of fruits and vegetables. Her weight loss was steady and she had no abdominal complaints, pains, nausea/vomiting. When she developed her attack of pain in July, she did have significant crampy pains lasted for almost a month associated with intermittent nausea and vomiting. Over the ensuing one to 2 months, these subsequently abated. Things have been the best they have been for the last month now since July. She denies any issues at this point with fever/chills/nausea/vomiting. She is supplementing her diet with protein shakes.  Last albumin checked was August 2020 and was noted to be 3.8. She had a CT scan abdomen/pelvis done 1 year ago which demonstrated diverticulosis without evidence of superimposed diverticulitis. Incidentally noted 2.6 and a near left-sided adnexal cyst. This was "almost certainly benign" but follow-up imaging was recommended.  12/06/2018 she had a repeat CT abdomen and pelvis which showed findings compatible with acute diverticulitis of the sigmoid colon. No abscess or bowel obstruction. She had another CT scan performed 01/29/2019 of her abdomen and pelvis which showed moderate sigmoid diverticulosis with several changes in the pericolonic fat  adjacent to the sigmoid in the upper midline pelvis which may be partially chronic although mild acute diverticulitis is possible. Mild to moderate amount of free fluid in the pelvis. No perforation. 2 small liver hypodensities unchanged.  She has been followed with Dr. Cristina Gong humus recently attempted colonoscopy on her. He noted significant tortuosity and angulation of the rectosigmoid colon precluding visualization passed 18 cm. He tried with the UltraSling scope as well as with water insufflation but was unsuccessful. He ordered a barium enema 04/03/2019 which showed this to be a relatively limited study because unable to reflux contrast beyond the distal sigmoid colon. There was an area of severe stricturing at the rectosigmoid junction which was felt to be a possible neoplasm versus severe scarring from chronic diverticular disease.  No mass has been visualized on her CT scans. There is no clear intraluminal mass to the proximal most extent that the scope was performed.   PMH:HTN, hypothyroidism (well controlled on synthroid), GERD (partially controlled with PPI), Sjogren syndrome, fibromyalgia  PSH: C-section 2 via Pfannenstiel; tubal ligation  FHx: Denies FHx of malignancy  Social: Denies use of tobacco/EtOH/drugs. Her father was a Education officer, environmental whom passed away August 13, 2006  ROS: A comprehensive 10 system review of systems was completed with the patient and pertinent findings as noted above.  The patient is a 64 year old female.   Past Surgical History (Chanel Teressa Senter, [place]; 04/22/2019 1:53 PM) Cesarean Section - Multiple  Colon Polyp Removal - Colonoscopy  Oral Surgery   Diagnostic Studies History (Chanel Teressa Senter, CMA; 04/22/2019 1:53 PM) Colonoscopy  within last year Mammogram  within last year Pap Smear  1-5 years ago  Allergies (Chanel Teressa Senter, CMA; 04/22/2019 1:54 PM) Etodolac *ANALGESICS - ANTI-INFLAMMATORY*  Topamax *  ANTICONVULSANTS*  Allergies Reconciled    Medication History (Chanel Teressa Senter, CMA; 04/22/2019 1:56 PM) Jublia (10% Solution, External) Active. Levothyroxine Sodium (125MCG Tablet, Oral) Active. Omeprazole (20MG  Capsule DR, Oral) Active. Ondansetron (4MG  Tablet Disint, Oral) Active. B Complex (Oral) Active. Calcium (Oral) Specific strength unknown - Active. Iron (Oral) Specific strength unknown - Active. Krill Oil (500MG  Capsule, Oral) Active. Potassium (Oral) Specific strength unknown - Active. Plaquenil (200MG  Tablet, Oral) Active. Medications Reconciled  Social History Antonietta Jewel, CMA; 04/22/2019 1:53 PM) Alcohol use  Recently quit alcohol use. Caffeine use  Coffee, Tea. Illicit drug use  Prefer to discuss with provider. Tobacco use  Former smoker.  Family History ([place], [place]; 04/22/2019 1:53 PM) Arthritis  Father, Mother. Breast Cancer  Mother. Colon Polyps  Mother.  Pregnancy / Birth History Antonietta Jewel, [place]; 04/22/2019 1:53 PM) Age at menarche  42 years. Age of menopause  51-55 Contraceptive History  Oral contraceptives. Gravida  3 Length (months) of breastfeeding  7-12 Maternal age  87-40 Para  3  Other Problems (Chanel Teressa Senter, [place]; 04/22/2019 1:53 PM) Arthritis  Back Pain  Diverticulosis  Gastroesophageal Reflux Disease  Hemorrhoids  Inguinal Hernia  Other disease, cancer, significant illness  Thyroid Disease     Review of Systems Harrell Gave M. Faiza Bansal MD; 04/22/2019 3:12 PM) General Present- Weight Loss. Not Present- Appetite Loss, Chills, Fatigue, Fever, Night Sweats and Weight Gain. Skin Present- Dryness and Rash. Not Present- Change in Wart/Mole, Hives, Jaundice, New Lesions, Non-Healing Wounds and Ulcer. HEENT Present- Wears glasses/contact lenses. Not Present- Earache, Hearing Loss, Hoarseness, Nose Bleed, Oral Ulcers, Ringing in the Ears, Seasonal Allergies, Sinus Pain, Sore Throat, Visual Disturbances and Yellow Eyes. Respiratory Not Present-  Bloody sputum, Chronic Cough, Difficulty Breathing, Snoring and Wheezing. Breast Not Present- Breast Mass, Breast Pain, Nipple Discharge and Skin Changes. Cardiovascular Present- Palpitations. Not Present- Chest Pain, Difficulty Breathing Lying Down, Leg Cramps, Rapid Heart Rate, Shortness of Breath and Swelling of Extremities. Gastrointestinal Present- Abdominal Pain, Change in Bowel Habits, Nausea, Rectal Pain and Vomiting. Not Present- Bloating, Bloody Stool, Chronic diarrhea, Constipation, Difficulty Swallowing, Excessive gas, Gets full quickly at meals, Hemorrhoids and Indigestion. Female Genitourinary Not Present- Frequency, Nocturia, Painful Urination, Pelvic Pain and Urgency. Musculoskeletal Present- Back Pain, Joint Pain and Joint Stiffness. Not Present- Muscle Pain, Muscle Weakness and Swelling of Extremities. Neurological Not Present- Decreased Memory, Fainting, Headaches, Numbness, Seizures, Tingling, Tremor, Trouble walking and Weakness. Psychiatric Present- Depression. Not Present- Anxiety, Bipolar, Change in Sleep Pattern, Fearful and Frequent crying. Endocrine Not Present- Cold Intolerance, Excessive Hunger, Hair Changes, Heat Intolerance, Hot flashes and New Diabetes. Hematology Not Present- Blood Thinners, Easy Bruising, Excessive bleeding, Gland problems, HIV and Persistent Infections.  Vitals (Tanisha A. Brown RMA; 04/22/2019 1:58 PM) 04/22/2019 1:58 PM Weight: 145.6 lb Height: 64in Body Surface Area: 1.71 m Body Mass Index: 24.99 kg/m  Temp.: 98.12F  Pulse: 86 (Regular)  BP: 128/82 (Sitting, Left Arm, Standard)       Physical Exam Harrell Gave M. Margrett Kalb MD; 04/22/2019 3:13 PM) The physical exam findings are as follows: Note:Constitutional: No acute distress; conversant; wearing mask Eyes: Moist conjunctiva; no lid lag; anicteric sclerae; pupils equal and round Neck: Trachea midline; no palpable masses/thyromegaly Lungs: Normal respiratory effort; no  tactile fremitus CV: rrr; no palpable thrill; no pitting edema GI: Abdomen soft, nontender, nondistended; no palpable hepatosplenomegaly; excess skin consistent with stated weight loss MSK: Normal gait; no clubbing/cyanosis Psychiatric: Appropriate affect; alert and oriented 3 Lymphatic: No palpable cervical or axillary lymphadenopathy **A chaperone, Geni Bers  Haggett, was present for this encounter    Assessment & Plan Harrell Gave M. Oliviya Gilkison MD; 04/22/2019 3:22 PM) DIVERTICULAR STRICTURE [id number]) Story: [name] is very pleasant 64yoF with his of HTN, hypothyroidism, GERD, Sjogren syndrome, fibromyalgia - here with what is most likely diverticular stricture given her symptoms and overall clinical picture  We had a long discussion today about her particular case. We discussed that her CT history showing diverticulitis with one year ago and again in July/September, suggests this to be a stricture of her rectosigmoid colon related to underlying/recurrent diverticulitis. Her symptoms actually improved in the last 2-3 months which clinically would go against this being a progressively enlarging luminal mass in something that is more inflammatory/edema-like in nature. Impression: -I spent time with her today going over her imaging with her and explaining her films. We had a long discussion that included question answering. -The anatomy and physiology of the GI tract was discussed at length with the patient. The pathophysiology of diverticulitis and strictures was discussed at length with associated pictures. -Given the severity of her symptoms, we discussed surgical options moving forward - robotic-assisted sigmoidectomy, possible conversion to open surgery, possible ostomy based on intraoperative findings, flexible sigmoidoscopy, cystoscopy/ureteral stents/firefly with urology -With her imaging findings and chronic appearing changes, we spent time emphasizing the potential for an ostomy with  this procedure including a colostomy which could be permanent. We discussed intraoperative findings that may result in this including significant retroperitoneal/pelvic fibrosis. She expressed understanding -The planned procedure, material risks (including, but not limited to, pain, bleeding, infection, scarring, need for blood transfusion, damage to surrounding structures- blood vessels/nerves/viscus/organs, damage to ureter, urine leak, leak from anastomosis, need for additional procedures, need for stoma which may be permanent, hernia, recurrence of diverticulitis, pneumonia, heart attack, stroke, death) benefits and alternatives to surgery were discussed at length. We discussed that long-term, without surgery, this can be progressive, and can ultimately result in obstruction of the GI tract. The patient's questions were answered to her satisfaction, she expressed understanding of all the above, and has elected to proceed with surgery. Counseling and coordination of care (all of which were face to face) represented >50% of the time spent with patient. Total time spent with patient and charting: 75 minutes  Signed by Ileana Roup, MD (04/22/2019 3:22 PM)

## 2019-06-18 ENCOUNTER — Other Ambulatory Visit (HOSPITAL_COMMUNITY): Payer: Medicare Other

## 2019-06-19 DIAGNOSIS — K5669 Other partial intestinal obstruction: Secondary | ICD-10-CM | POA: Diagnosis not present

## 2019-06-21 ENCOUNTER — Inpatient Hospital Stay: Admit: 2019-06-21 | Payer: Medicare Other | Admitting: Surgery

## 2019-06-21 SURGERY — COLECTOMY, PARTIAL, ROBOT-ASSISTED, LAPAROSCOPIC
Anesthesia: General

## 2019-07-03 DIAGNOSIS — M1812 Unilateral primary osteoarthritis of first carpometacarpal joint, left hand: Secondary | ICD-10-CM | POA: Diagnosis not present

## 2019-07-03 DIAGNOSIS — M25532 Pain in left wrist: Secondary | ICD-10-CM | POA: Diagnosis not present

## 2019-07-03 DIAGNOSIS — M79645 Pain in left finger(s): Secondary | ICD-10-CM | POA: Diagnosis not present

## 2019-07-03 DIAGNOSIS — G5602 Carpal tunnel syndrome, left upper limb: Secondary | ICD-10-CM | POA: Diagnosis not present

## 2019-07-22 DIAGNOSIS — R1030 Lower abdominal pain, unspecified: Secondary | ICD-10-CM | POA: Diagnosis not present

## 2019-07-22 DIAGNOSIS — Z8719 Personal history of other diseases of the digestive system: Secondary | ICD-10-CM | POA: Diagnosis not present

## 2019-07-22 DIAGNOSIS — K56699 Other intestinal obstruction unspecified as to partial versus complete obstruction: Secondary | ICD-10-CM | POA: Diagnosis not present

## 2019-07-23 NOTE — Patient Instructions (Signed)
DUE TO COVID-19 ONLY ONE VISITOR IS ALLOWED TO COME WITH YOU AND STAY IN THE WAITING ROOM ONLY DURING PRE OP AND PROCEDURE DAY OF SURGERY. THE 1 VISITOR MAY VISIT WITH YOU AFTER SURGERY IN YOUR PRIVATE ROOM DURING VISITING HOURS ONLY!  YOU NEED TO HAVE A COVID 19 TEST ON_3/22______ @11 :45_______, THIS TEST MUST BE DONE BEFORE SURGERY, COME  [address], [place] , [address].  ([place]) ONCE YOUR COVID TEST IS COMPLETED, PLEASE BEGIN THE QUARANTINE INSTRUCTIONS AS OUTLINED IN YOUR HANDOUT.                Jennifer Franco    Your procedure is scheduled on: 08/01/19   Report to [id number] Main  Entrance   Report to Short Stay at 5:45 AM     Call this number if you have problems the morning of surgery [place], NO CHEWING GUM [place].  Follow all bowel prep instructions from the Dr's office.  Drink plenty of fluid the day before to prevent dehydration while doing the bowel prep.  At 10:00 the night before surgery drink 2 pre surgery insure drinks    Do not eat food After Midnight.   YOU MAY HAVE CLEAR LIQUIDS FROM MIDNIGHT UNTIL 4:30AM  . At 4:30AM Please finish the prescribed Pre-Surgery  drink.   Nothing by mouth after you finish the  drink !   Take these medicines the morning of surgery with A SIP OF WATER: Levothyroxine,Zofran if needed                                 You may not have any metal on your body including hair pins and              piercings  Do not wear jewelry, make-up, lotions, powders or perfumes, deodorant             Do not wear nail polish on your fingernails.  Do not shave  48 hours prior to surgery.                 Do not bring valuables to the hospital. [place].  Contacts, dentures or bridgework may not be worn into surgery.                    Please read over the following fact  sheets you were given: _____________________________________________________________________             [facility] - Preparing for Surgery Before surgery, you can play an important role.  Because skin is not sterile, your skin needs to be as free of germs as possible.  You can reduce the number of germs on your skin by washing with CHG (chlorahexidine gluconate) soap before surgery.  CHG is an antiseptic cleaner which kills germs and bonds with the skin to continue killing germs even after washing. Please DO NOT use if you have an allergy to CHG or antibacterial soaps.  If your skin becomes reddened/irritated stop using the CHG and inform your nurse when you arrive at Short Stay. Do not shave (including legs and underarms) for at least 48 hours prior to the first CHG shower.  You may shave  your face/neck. Please follow these instructions carefully:  1.  Shower with CHG Soap the night before surgery and the  morning of Surgery.  2.  If you choose to wash your hair, wash your hair first as usual with your  normal  shampoo.  3.  After you shampoo, rinse your hair and body thoroughly to remove the  shampoo.                                         4.  Use CHG as you would any other liquid soap.  You can apply chg directly  to the skin and wash                       Gently with a scrungie or clean washcloth.  5.  Apply the CHG Soap to your body ONLY FROM THE NECK DOWN.   Do not use on face/ open                           Wound or open sores. Avoid contact with eyes, ears mouth and genitals (private parts).                       Wash face,  Genitals (private parts) with your normal soap.             6.  Wash thoroughly, paying special attention to the area where your surgery  will be performed.  7.  Thoroughly rinse your body with warm water from the neck down.  8.  DO NOT shower/wash with your normal soap after using and rinsing off  the CHG Soap.             9.  Pat yourself dry with a clean  towel.            10.  Wear clean pajamas.            11.  Place clean sheets on your bed the night of your first shower and do not  sleep with pets. Day of Surgery : Do not apply any lotions/deodorants the morning of surgery.  Please wear clean clothes to the hospital/surgery center.  FAILURE TO FOLLOW THESE INSTRUCTIONS MAY RESULT IN THE CANCELLATION OF YOUR SURGERY PATIENT SIGNATURE_________________________________  NURSE SIGNATURE__________________________________  ________________________________________________________________________   Adam Phenix  An incentive spirometer is a tool that can help keep your lungs clear and active. This tool measures how well you are filling your lungs with each breath. Taking long deep breaths may help reverse or decrease the chance of developing breathing (pulmonary) problems (especially infection) following:  A long period of time when you are unable to move or be active. BEFORE THE PROCEDURE   If the spirometer includes an indicator to show your best effort, your nurse or respiratory therapist will set it to a desired goal.  If possible, sit up straight or lean slightly forward. Try not to slouch.  Hold the incentive spirometer in an upright position. INSTRUCTIONS FOR USE  1. Sit on the edge of your bed if possible, or sit up as far as you can in bed or on a chair. 2. Hold the incentive spirometer in an upright position. 3. Breathe out normally. 4. Place the mouthpiece in your mouth and seal your lips tightly around it. 5. Breathe  in slowly and as deeply as possible, raising the piston or the ball toward the top of the column. 6. Hold your breath for 3-5 seconds or for as long as possible. Allow the piston or ball to fall to the bottom of the column. 7. Remove the mouthpiece from your mouth and breathe out normally. 8. Rest for a few seconds and repeat Steps 1 through 7 at least 10 times every 1-2 hours when you are awake. Take your  time and take a few normal breaths between deep breaths. 9. The spirometer may include an indicator to show your best effort. Use the indicator as a goal to work toward during each repetition. 10. After each set of 10 deep breaths, practice coughing to be sure your lungs are clear. If you have an incision (the cut made at the time of surgery), support your incision when coughing by placing a pillow or rolled up towels firmly against it. Once you are able to get out of bed, walk around indoors and cough well. You may stop using the incentive spirometer when instructed by your caregiver.  RISKS AND COMPLICATIONS  Take your time so you do not get dizzy or light-headed.  If you are in pain, you may need to take or ask for pain medication before doing incentive spirometry. It is harder to take a deep breath if you are having pain. AFTER USE  Rest and breathe slowly and easily.  It can be helpful to keep track of a log of your progress. Your caregiver can provide you with a simple table to help with this. If you are using the spirometer at home, follow these instructions: [place] IF:   You are having difficultly using the spirometer.  You have trouble using the spirometer as often as instructed.  Your pain medication is not giving enough relief while using the spirometer.  You develop fever of 100.5 F (38.1 C) or higher. SEEK IMMEDIATE MEDICAL CARE IF:   You cough up bloody sputum that had not been present before.  You develop fever of 102 F (38.9 C) or greater.  You develop worsening pain at or near the incision site. MAKE SURE YOU:   Understand these instructions.  Will watch your condition.  Will get help right away if you are not doing well or get worse. Document Released: 09/05/2006 Document Revised: 07/18/2011 Document Reviewed: 11/06/2006 [facility] Patient Information 2014 [place],  [place].   ________________________________________________________________________

## 2019-07-24 ENCOUNTER — Encounter (HOSPITAL_COMMUNITY)
Admission: RE | Admit: 2019-07-24 | Discharge: 2019-07-24 | Disposition: A | Discharge status: Home / Self Care | Payer: BC Managed Care – PPO | Source: Ambulatory Visit | Attending: Surgery | Admitting: Surgery

## 2019-07-24 ENCOUNTER — Encounter (HOSPITAL_COMMUNITY): Payer: Self-pay

## 2019-07-24 ENCOUNTER — Other Ambulatory Visit: Payer: Self-pay

## 2019-07-24 DIAGNOSIS — Z01812 Encounter for preprocedural laboratory examination: Secondary | ICD-10-CM | POA: Insufficient documentation

## 2019-07-24 HISTORY — DX: Anxiety disorder, unspecified: F41.9

## 2019-07-24 HISTORY — DX: Angina pectoris, unspecified: I20.9

## 2019-07-24 LAB — CBC WITH DIFFERENTIAL/PLATELET
Abs Immature Granulocytes: 0.01 10*3/uL (ref 0.00–0.07)
Basophils Absolute: 0 10*3/uL (ref 0.0–0.1)
Basophils Relative: 1 %
Eosinophils Absolute: 0.1 10*3/uL (ref 0.0–0.5)
Eosinophils Relative: 1 %
HCT: 39.4 % (ref 36.0–46.0)
Hemoglobin: 12.7 g/dL (ref 12.0–15.0)
Immature Granulocytes: 0 %
Lymphocytes Relative: 42 %
Lymphs Abs: 2.1 10*3/uL (ref 0.7–4.0)
MCH: 28.5 pg (ref 26.0–34.0)
MCHC: 32.2 g/dL (ref 30.0–36.0)
MCV: 88.5 fL (ref 80.0–100.0)
Monocytes Absolute: 0.5 10*3/uL (ref 0.1–1.0)
Monocytes Relative: 10 %
Neutro Abs: 2.4 10*3/uL (ref 1.7–7.7)
Neutrophils Relative %: 46 %
Platelets: 279 10*3/uL (ref 150–400)
RBC: 4.45 MIL/uL (ref 3.87–5.11)
RDW: 15.3 % (ref 11.5–15.5)
WBC: 5.1 10*3/uL (ref 4.0–10.5)
nRBC: 0 % (ref 0.0–0.2)

## 2019-07-24 LAB — HEMOGLOBIN A1C
Hgb A1c MFr Bld: 6 % — ABNORMAL HIGH (ref 4.8–5.6)
Mean Plasma Glucose: 125.5 mg/dL

## 2019-07-24 LAB — COMPREHENSIVE METABOLIC PANEL
ALT: 12 U/L (ref 0–44)
AST: 15 U/L (ref 15–41)
Albumin: 4.5 g/dL (ref 3.5–5.0)
Alkaline Phosphatase: 48 U/L (ref 38–126)
Anion gap: 8 (ref 5–15)
BUN: 12 mg/dL (ref 8–23)
CO2: 27 mmol/L (ref 22–32)
Calcium: 9.6 mg/dL (ref 8.9–10.3)
Chloride: 105 mmol/L (ref 98–111)
Creatinine, Ser: 0.63 mg/dL (ref 0.44–1.00)
GFR calc Af Amer: >60 mL/min (ref >60)
GFR calc non Af Amer: >60 mL/min (ref >60)
Glucose, Bld: 105 mg/dL — ABNORMAL HIGH (ref 70–99)
Potassium: 4.5 mmol/L (ref 3.5–5.1)
Sodium: 140 mmol/L (ref 135–145)
Total Bilirubin: 0.6 mg/dL (ref 0.3–1.2)
Total Protein: 7.1 g/dL (ref 6.5–8.1)

## 2019-07-24 LAB — PROTIME-INR
INR: 0.9 (ref 0.8–1.2)
Prothrombin Time: 12.3 seconds (ref 11.4–15.2)

## 2019-07-24 LAB — APTT: aPTT: 35 seconds (ref 24–36)

## 2019-07-24 LAB — ABO/RH: ABO/RH(D): B NEG

## 2019-07-24 NOTE — Progress Notes (Signed)
PCP - Dr. Lady Deutscher Cardiologist - Dr. Linard Millers  Chest x-ray - no EKG - 03/28/19 Stress Test - no ECHO - no Cardiac Cath - no  Sleep Study - no CPAP -   Fasting Blood Sugar - NA Checks Blood Sugar _____ times a day  Blood Thinner Instructions:NA Aspirin Instructions: Last Dose:  Anesthesia review:   Patient denies shortness of breath, fever, cough and chest pain at PAT appointment yes Patient verbalized understanding of instructions that were given to them at the PAT appointment. Patient was also instructed that they will need to review over the PAT instructions again at home before surgery. Yes

## 2019-07-24 NOTE — Consult Note (Addendum)
Jennifer Franco Nurse requested for preoperative stoma site marking  Discussed surgical procedure and stoma creation with patient. Explained role of the [facility] nurse team.  Provided the patient with educational booklet and provided samples of pouching options.  Answered patient's questions.   Examined patient sitting and standing in order to place the marking in the patient's visual field, away from any creases or abdominal contour issues and within the rectus muscle.  Attempted to mark below the patient's belt line, but this was not possible related to significant skin creases which are located lower and should be avoided if possible.   5 Marked for colostomy in the LLQ  _5___ cm to the left of the umbilicus and [id number] above the umbilicus.  Marked for ileostomy in the RLQ  _5___cm to the right of the umbilicus and  [id number] cm above the umbilicus.  Patient's abdomen cleansed with CHG wipes at site markings, allowed to air dry prior to marking.Covered mark with thin film transparent dressing to preserve mark until date of surgery and marking pen given to the patient to refill the site if necessary before surgery.    [facility] Nurse team will follow up with patient after surgery for continued ostomy care and teaching if she receives an ostomy.  Julien Girt MSN, RN, [place], [place], [place]

## 2019-07-29 ENCOUNTER — Other Ambulatory Visit (HOSPITAL_COMMUNITY)
Admission: RE | Admit: 2019-07-29 | Discharge: 2019-07-29 | Disposition: A | Discharge status: Home / Self Care | Payer: BC Managed Care – PPO | Source: Ambulatory Visit | Attending: Surgery | Admitting: Surgery

## 2019-07-29 DIAGNOSIS — Z20822 Contact with and (suspected) exposure to covid-19: Secondary | ICD-10-CM | POA: Diagnosis not present

## 2019-07-29 DIAGNOSIS — Z01812 Encounter for preprocedural laboratory examination: Secondary | ICD-10-CM | POA: Insufficient documentation

## 2019-07-29 LAB — SARS CORONAVIRUS 2 (TAT 6-24 HRS): SARS Coronavirus 2: NEGATIVE

## 2019-07-31 MED ORDER — BUPIVACAINE LIPOSOME 1.3 % IJ SUSP
20.0000 mL | Freq: Once | INTRAMUSCULAR | Status: DC
  Filled 2019-07-31: qty 20

## 2019-08-01 ENCOUNTER — Other Ambulatory Visit: Payer: Self-pay

## 2019-08-01 ENCOUNTER — Inpatient Hospital Stay (HOSPITAL_COMMUNITY): Payer: BC Managed Care – PPO | Admitting: Anesthesiology

## 2019-08-01 ENCOUNTER — Encounter (HOSPITAL_COMMUNITY)
Admission: RE | Disposition: A | Discharge status: Home / Self Care | Payer: Self-pay | Source: Home / Self Care | Attending: Surgery

## 2019-08-01 ENCOUNTER — Encounter (HOSPITAL_COMMUNITY): Payer: Self-pay | Admitting: Surgery

## 2019-08-01 ENCOUNTER — Inpatient Hospital Stay (HOSPITAL_COMMUNITY)
Admission: RE | Admit: 2019-08-01 | Discharge: 2019-08-03 | DRG: 336 | Disposition: A | Discharge status: Home / Self Care | Payer: BC Managed Care – PPO | Attending: Surgery | Admitting: Surgery

## 2019-08-01 ENCOUNTER — Inpatient Hospital Stay (HOSPITAL_COMMUNITY): Payer: BC Managed Care – PPO

## 2019-08-01 DIAGNOSIS — Z87891 Personal history of nicotine dependence: Secondary | ICD-10-CM | POA: Diagnosis not present

## 2019-08-01 DIAGNOSIS — I10 Essential (primary) hypertension: Secondary | ICD-10-CM | POA: Diagnosis present

## 2019-08-01 DIAGNOSIS — Z820 Family history of epilepsy and other diseases of the nervous system: Secondary | ICD-10-CM | POA: Diagnosis not present

## 2019-08-01 DIAGNOSIS — Z83511 Family history of glaucoma: Secondary | ICD-10-CM | POA: Diagnosis not present

## 2019-08-01 DIAGNOSIS — Z803 Family history of malignant neoplasm of breast: Secondary | ICD-10-CM | POA: Diagnosis not present

## 2019-08-01 DIAGNOSIS — K2100 Gastro-esophageal reflux disease with esophagitis, without bleeding: Secondary | ICD-10-CM | POA: Diagnosis present

## 2019-08-01 DIAGNOSIS — K5732 Diverticulitis of large intestine without perforation or abscess without bleeding: Secondary | ICD-10-CM | POA: Diagnosis present

## 2019-08-01 DIAGNOSIS — Z20822 Contact with and (suspected) exposure to covid-19: Secondary | ICD-10-CM | POA: Diagnosis present

## 2019-08-01 DIAGNOSIS — M069 Rheumatoid arthritis, unspecified: Secondary | ICD-10-CM | POA: Diagnosis present

## 2019-08-01 DIAGNOSIS — Z79899 Other long term (current) drug therapy: Secondary | ICD-10-CM

## 2019-08-01 DIAGNOSIS — D494 Neoplasm of unspecified behavior of bladder: Secondary | ICD-10-CM | POA: Diagnosis present

## 2019-08-01 DIAGNOSIS — N3592 Unspecified urethral stricture, female: Secondary | ICD-10-CM | POA: Diagnosis present

## 2019-08-01 DIAGNOSIS — M3500 Sicca syndrome, unspecified: Secondary | ICD-10-CM | POA: Diagnosis present

## 2019-08-01 DIAGNOSIS — N736 Female pelvic peritoneal adhesions (postinfective): Secondary | ICD-10-CM | POA: Diagnosis present

## 2019-08-01 DIAGNOSIS — Z8371 Family history of colonic polyps: Secondary | ICD-10-CM

## 2019-08-01 DIAGNOSIS — N135 Crossing vessel and stricture of ureter without hydronephrosis: Secondary | ICD-10-CM | POA: Diagnosis present

## 2019-08-01 DIAGNOSIS — M797 Fibromyalgia: Secondary | ICD-10-CM | POA: Diagnosis present

## 2019-08-01 DIAGNOSIS — Z86010 Personal history of colonic polyps: Secondary | ICD-10-CM | POA: Diagnosis not present

## 2019-08-01 DIAGNOSIS — E039 Hypothyroidism, unspecified: Secondary | ICD-10-CM | POA: Diagnosis present

## 2019-08-01 DIAGNOSIS — Z79890 Hormone replacement therapy (postmenopausal): Secondary | ICD-10-CM | POA: Diagnosis not present

## 2019-08-01 DIAGNOSIS — Z9049 Acquired absence of other specified parts of digestive tract: Secondary | ICD-10-CM

## 2019-08-01 DIAGNOSIS — K56699 Other intestinal obstruction unspecified as to partial versus complete obstruction: Principal | ICD-10-CM | POA: Diagnosis present

## 2019-08-01 HISTORY — PX: CYSTOSCOPY WITH STENT PLACEMENT: SHX5790

## 2019-08-01 HISTORY — PX: FLEXIBLE SIGMOIDOSCOPY: SHX5431

## 2019-08-01 LAB — TYPE AND SCREEN
ABO/RH(D): B NEG
Antibody Screen: NEGATIVE

## 2019-08-01 SURGERY — COLECTOMY, PARTIAL, ROBOT-ASSISTED, LAPAROSCOPIC
Anesthesia: General | Site: Abdomen

## 2019-08-01 MED ORDER — HEPARIN SODIUM (PORCINE) 5000 UNIT/ML IJ SOLN
5000.0000 [IU] | Freq: Three times a day (TID) | INTRAMUSCULAR | Status: DC
  Administered 2019-08-01 – 2019-08-03 (×6): 5000 [IU] via SUBCUTANEOUS
  Filled 2019-08-01 (×7): qty 1

## 2019-08-01 MED ORDER — ONDANSETRON HCL 4 MG/2ML IJ SOLN
4.0000 mg | Freq: Four times a day (QID) | INTRAMUSCULAR | Status: DC | PRN
  Administered 2019-08-01: 4 mg via INTRAVENOUS
  Filled 2019-08-01: qty 2

## 2019-08-01 MED ORDER — HEPARIN SODIUM (PORCINE) 5000 UNIT/ML IJ SOLN
INTRAMUSCULAR | Status: AC
  Administered 2019-08-01: 5000 [IU] via SUBCUTANEOUS
  Filled 2019-08-01: qty 1

## 2019-08-01 MED ORDER — SUCRALFATE 1 G PO TABS
1.0000 g | ORAL_TABLET | Freq: Two times a day (BID) | ORAL | Status: DC
  Administered 2019-08-01 – 2019-08-03 (×4): 1 g via ORAL
  Filled 2019-08-01 (×5): qty 1

## 2019-08-01 MED ORDER — OXYCODONE HCL 5 MG/5ML PO SOLN: 5.0000 mg | Freq: Once | ORAL | Status: DC | PRN

## 2019-08-01 MED ORDER — ACETAMINOPHEN 500 MG PO TABS: 1000.0000 mg | ORAL_TABLET | ORAL | Status: AC

## 2019-08-01 MED ORDER — METRONIDAZOLE 500 MG PO TABS: 1000.0000 mg | ORAL_TABLET | ORAL | Status: DC

## 2019-08-01 MED ORDER — KETAMINE HCL 10 MG/ML IJ SOLN
INTRAMUSCULAR | Status: AC
  Filled 2019-08-01: qty 1

## 2019-08-01 MED ORDER — DEXAMETHASONE SODIUM PHOSPHATE 10 MG/ML IJ SOLN
INTRAMUSCULAR | Status: DC | PRN
  Administered 2019-08-01: 8 mg via INTRAVENOUS

## 2019-08-01 MED ORDER — DEXAMETHASONE SODIUM PHOSPHATE 10 MG/ML IJ SOLN
INTRAMUSCULAR | Status: AC
  Filled 2019-08-01: qty 1

## 2019-08-01 MED ORDER — HEPARIN SODIUM (PORCINE) 5000 UNIT/ML IJ SOLN: 5000.0000 [IU] | Freq: Once | INTRAMUSCULAR | Status: AC

## 2019-08-01 MED ORDER — IBUPROFEN 400 MG PO TABS
600.0000 mg | ORAL_TABLET | Freq: Four times a day (QID) | ORAL | Status: DC | PRN
  Administered 2019-08-02: 600 mg via ORAL
  Filled 2019-08-01: qty 1

## 2019-08-01 MED ORDER — BUPIVACAINE-EPINEPHRINE (PF) 0.25% -1:200000 IJ SOLN
INTRAMUSCULAR | Status: AC
  Filled 2019-08-01: qty 30

## 2019-08-01 MED ORDER — EPHEDRINE SULFATE 50 MG/ML IJ SOLN
INTRAMUSCULAR | Status: DC | PRN
  Administered 2019-08-01 (×3): 5 mg via INTRAVENOUS

## 2019-08-01 MED ORDER — PROMETHAZINE HCL 25 MG/ML IJ SOLN: 6.2500 mg | INTRAMUSCULAR | Status: DC | PRN

## 2019-08-01 MED ORDER — FENTANYL CITRATE (PF) 250 MCG/5ML IJ SOLN
INTRAMUSCULAR | Status: AC
  Filled 2019-08-01: qty 5

## 2019-08-01 MED ORDER — EPHEDRINE 5 MG/ML INJ
INTRAVENOUS | Status: AC
  Filled 2019-08-01: qty 10

## 2019-08-01 MED ORDER — MEPERIDINE HCL 50 MG/ML IJ SOLN: 6.2500 mg | INTRAMUSCULAR | Status: DC | PRN

## 2019-08-01 MED ORDER — PROPOFOL 10 MG/ML IV BOLUS
INTRAVENOUS | Status: DC | PRN
  Administered 2019-08-01: 100 mg via INTRAVENOUS

## 2019-08-01 MED ORDER — LEVOTHYROXINE SODIUM 125 MCG PO TABS
125.0000 ug | ORAL_TABLET | Freq: Every day | ORAL | Status: DC
  Administered 2019-08-02 – 2019-08-03 (×2): 125 ug via ORAL
  Filled 2019-08-01 (×2): qty 1

## 2019-08-01 MED ORDER — LIDOCAINE HCL 2 % IJ SOLN
INTRAMUSCULAR | Status: AC
  Filled 2019-08-01: qty 20

## 2019-08-01 MED ORDER — OXYCODONE HCL 5 MG PO TABS: 5.0000 mg | ORAL_TABLET | Freq: Once | ORAL | Status: DC | PRN

## 2019-08-01 MED ORDER — FOLIC ACID 1 MG PO TABS
1.0000 mg | ORAL_TABLET | Freq: Every day | ORAL | Status: DC
  Administered 2019-08-02 – 2019-08-03 (×2): 1 mg via ORAL
  Filled 2019-08-01 (×2): qty 1

## 2019-08-01 MED ORDER — HYDROMORPHONE HCL 2 MG/ML IJ SOLN
INTRAMUSCULAR | Status: AC
  Filled 2019-08-01: qty 1

## 2019-08-01 MED ORDER — ONDANSETRON HCL 4 MG/2ML IJ SOLN
INTRAMUSCULAR | Status: DC | PRN
  Administered 2019-08-01: 4 mg via INTRAVENOUS

## 2019-08-01 MED ORDER — STERILE WATER FOR INJECTION IJ SOLN
INTRAMUSCULAR | Status: DC | PRN
  Administered 2019-08-01: 20 mL via URETERAL

## 2019-08-01 MED ORDER — SODIUM CHLORIDE 0.9 % IR SOLN
Status: DC | PRN
  Administered 2019-08-01: 3000 mL

## 2019-08-01 MED ORDER — POLYETHYLENE GLYCOL 3350 17 GM/SCOOP PO POWD: 1.0000 | Freq: Once | ORAL | Status: DC

## 2019-08-01 MED ORDER — STERILE WATER FOR INJECTION IJ SOLN
INTRAMUSCULAR | Status: AC
  Filled 2019-08-01: qty 10

## 2019-08-01 MED ORDER — LACTATED RINGERS IV SOLN: INTRAVENOUS | Status: DC

## 2019-08-01 MED ORDER — LACTATED RINGERS IR SOLN
Status: DC | PRN
  Administered 2019-08-01: 1000 mL

## 2019-08-01 MED ORDER — LIP MEDEX EX OINT
TOPICAL_OINTMENT | CUTANEOUS | Status: AC
  Filled 2019-08-01: qty 7

## 2019-08-01 MED ORDER — LABETALOL HCL 5 MG/ML IV SOLN
INTRAVENOUS | Status: AC
  Filled 2019-08-01: qty 4

## 2019-08-01 MED ORDER — FENTANYL CITRATE (PF) 250 MCG/5ML IJ SOLN
INTRAMUSCULAR | Status: DC | PRN
  Administered 2019-08-01 (×2): 50 ug via INTRAVENOUS
  Administered 2019-08-01: 100 ug via INTRAVENOUS
  Administered 2019-08-01: 50 ug via INTRAVENOUS

## 2019-08-01 MED ORDER — ALVIMOPAN 12 MG PO CAPS: 12.0000 mg | ORAL_CAPSULE | ORAL | Status: AC

## 2019-08-01 MED ORDER — HYDROMORPHONE HCL 1 MG/ML IJ SOLN
0.2500 mg | INTRAMUSCULAR | Status: DC | PRN
  Administered 2019-08-01 (×2): 0.5 mg via INTRAVENOUS

## 2019-08-01 MED ORDER — CHLORHEXIDINE GLUCONATE CLOTH 2 % EX PADS: 6.0000 | MEDICATED_PAD | Freq: Once | CUTANEOUS | Status: DC

## 2019-08-01 MED ORDER — SODIUM CHLORIDE 0.9 % IV SOLN
2.0000 g | INTRAVENOUS | Status: AC
  Administered 2019-08-01: 2 g via INTRAVENOUS

## 2019-08-01 MED ORDER — PROPOFOL 10 MG/ML IV BOLUS
INTRAVENOUS | Status: AC
  Filled 2019-08-01: qty 20

## 2019-08-01 MED ORDER — HYDROXYCHLOROQUINE SULFATE 200 MG PO TABS
200.0000 mg | ORAL_TABLET | Freq: Two times a day (BID) | ORAL | Status: DC
  Administered 2019-08-02 – 2019-08-03 (×3): 200 mg via ORAL
  Filled 2019-08-01 (×5): qty 1

## 2019-08-01 MED ORDER — ONDANSETRON HCL 4 MG PO TABS
4.0000 mg | ORAL_TABLET | Freq: Four times a day (QID) | ORAL | Status: DC | PRN
  Administered 2019-08-03: 4 mg via ORAL
  Filled 2019-08-01: qty 1

## 2019-08-01 MED ORDER — ACETAMINOPHEN 500 MG PO TABS
1000.0000 mg | ORAL_TABLET | Freq: Four times a day (QID) | ORAL | Status: DC
  Administered 2019-08-01 – 2019-08-03 (×7): 1000 mg via ORAL
  Filled 2019-08-01 (×7): qty 2

## 2019-08-01 MED ORDER — BISACODYL 5 MG PO TBEC
20.0000 mg | DELAYED_RELEASE_TABLET | Freq: Once | ORAL | Status: DC
  Filled 2019-08-01: qty 4

## 2019-08-01 MED ORDER — BUPIVACAINE-EPINEPHRINE (PF) 0.25% -1:200000 IJ SOLN
INTRAMUSCULAR | Status: DC | PRN
  Administered 2019-08-01: 30 mL

## 2019-08-01 MED ORDER — LIDOCAINE 20MG/ML (2%) 15 ML SYRINGE OPTIME
INTRAMUSCULAR | Status: DC | PRN
  Administered 2019-08-01: 1.5 mg/kg/h via INTRAVENOUS

## 2019-08-01 MED ORDER — HYDROMORPHONE HCL 1 MG/ML IJ SOLN
INTRAMUSCULAR | Status: AC
  Filled 2019-08-01: qty 2

## 2019-08-01 MED ORDER — ALUM & MAG HYDROXIDE-SIMETH 200-200-20 MG/5ML PO SUSP: 30.0000 mL | Freq: Four times a day (QID) | ORAL | Status: DC | PRN

## 2019-08-01 MED ORDER — DIPHENHYDRAMINE HCL 50 MG/ML IJ SOLN: 12.5000 mg | Freq: Four times a day (QID) | INTRAMUSCULAR | Status: DC | PRN

## 2019-08-01 MED ORDER — ALVIMOPAN 12 MG PO CAPS
12.0000 mg | ORAL_CAPSULE | Freq: Two times a day (BID) | ORAL | Status: DC
  Administered 2019-08-02: 12 mg via ORAL
  Filled 2019-08-01: qty 1

## 2019-08-01 MED ORDER — HYDROMORPHONE HCL 1 MG/ML IJ SOLN
INTRAMUSCULAR | Status: DC | PRN
  Administered 2019-08-01: .5 mg via INTRAVENOUS

## 2019-08-01 MED ORDER — ROCURONIUM BROMIDE 10 MG/ML (PF) SYRINGE
PREFILLED_SYRINGE | INTRAVENOUS | Status: AC
  Filled 2019-08-01: qty 10

## 2019-08-01 MED ORDER — ACETAMINOPHEN 500 MG PO TABS
ORAL_TABLET | ORAL | Status: AC
  Administered 2019-08-01: 1000 mg via ORAL
  Filled 2019-08-01: qty 2

## 2019-08-01 MED ORDER — DIPHENHYDRAMINE HCL 12.5 MG/5ML PO ELIX: 12.5000 mg | ORAL_SOLUTION | Freq: Four times a day (QID) | ORAL | Status: DC | PRN

## 2019-08-01 MED ORDER — ROCURONIUM BROMIDE 100 MG/10ML IV SOLN
INTRAVENOUS | Status: DC | PRN
  Administered 2019-08-01: 20 mg via INTRAVENOUS
  Administered 2019-08-01: 60 mg via INTRAVENOUS
  Administered 2019-08-01: 20 mg via INTRAVENOUS
  Administered 2019-08-01: 10 mg via INTRAVENOUS
  Administered 2019-08-01: 20 mg via INTRAVENOUS
  Administered 2019-08-01: 10 mg via INTRAVENOUS

## 2019-08-01 MED ORDER — LACTATED RINGERS IV SOLN: INTRAVENOUS | Status: DC | PRN

## 2019-08-01 MED ORDER — KETAMINE HCL 10 MG/ML IJ SOLN
INTRAMUSCULAR | Status: DC | PRN
  Administered 2019-08-01: 30 mg via INTRAVENOUS

## 2019-08-01 MED ORDER — SUGAMMADEX SODIUM 200 MG/2ML IV SOLN
INTRAVENOUS | Status: DC | PRN
  Administered 2019-08-01: 150 mg via INTRAVENOUS

## 2019-08-01 MED ORDER — NEOMYCIN SULFATE 500 MG PO TABS: 1000.0000 mg | ORAL_TABLET | ORAL | Status: DC

## 2019-08-01 MED ORDER — ONDANSETRON HCL 4 MG/2ML IJ SOLN
INTRAMUSCULAR | Status: AC
  Filled 2019-08-01: qty 2

## 2019-08-01 MED ORDER — IOHEXOL 300 MG/ML  SOLN
INTRAMUSCULAR | Status: DC | PRN
  Administered 2019-08-01: 15 mL via URETHRAL

## 2019-08-01 MED ORDER — ENSURE SURGERY PO LIQD
237.0000 mL | Freq: Two times a day (BID) | ORAL | Status: DC
  Administered 2019-08-02 (×2): 237 mL via ORAL
  Filled 2019-08-01 (×5): qty 237

## 2019-08-01 MED ORDER — TRAMADOL HCL 50 MG PO TABS: 50.0000 mg | ORAL_TABLET | Freq: Four times a day (QID) | ORAL | Status: DC | PRN

## 2019-08-01 MED ORDER — LIDOCAINE 2% (20 MG/ML) 5 ML SYRINGE
INTRAMUSCULAR | Status: AC
  Filled 2019-08-01: qty 5

## 2019-08-01 MED ORDER — INDOCYANINE GREEN 25 MG IV SOLR
INTRAVENOUS | Status: DC | PRN
  Administered 2019-08-01: 7.5 mg via INTRAVENOUS

## 2019-08-01 MED ORDER — LABETALOL HCL 5 MG/ML IV SOLN
INTRAVENOUS | Status: DC | PRN
  Administered 2019-08-01 (×2): 5 mg via INTRAVENOUS

## 2019-08-01 MED ORDER — MIDAZOLAM HCL 2 MG/2ML IJ SOLN
INTRAMUSCULAR | Status: AC
  Filled 2019-08-01: qty 2

## 2019-08-01 MED ORDER — LIDOCAINE HCL (CARDIAC) PF 100 MG/5ML IV SOSY
PREFILLED_SYRINGE | INTRAVENOUS | Status: DC | PRN
  Administered 2019-08-01: 40 mg via INTRAVENOUS

## 2019-08-01 MED ORDER — SODIUM CHLORIDE 0.9 % IV SOLN
INTRAVENOUS | Status: AC
  Filled 2019-08-01: qty 2

## 2019-08-01 MED ORDER — BUPIVACAINE LIPOSOME 1.3 % IJ SUSP
INTRAMUSCULAR | Status: DC | PRN
  Administered 2019-08-01: 20 mL

## 2019-08-01 MED ORDER — MIDAZOLAM HCL 5 MG/5ML IJ SOLN
INTRAMUSCULAR | Status: DC | PRN
  Administered 2019-08-01: 2 mg via INTRAVENOUS

## 2019-08-01 MED ORDER — HYDROMORPHONE HCL 1 MG/ML IJ SOLN: 0.5000 mg | INTRAMUSCULAR | Status: DC | PRN

## 2019-08-01 MED ORDER — ALVIMOPAN 12 MG PO CAPS
ORAL_CAPSULE | ORAL | Status: AC
  Administered 2019-08-01: 12 mg via ORAL
  Filled 2019-08-01: qty 1

## 2019-08-01 SURGICAL SUPPLY — 134 items
ADAPTER GOLDBERG URETERAL (ADAPTER) ×1 IMPLANT
ADH SKN CLS APL DERMABOND .7 (GAUZE/BANDAGES/DRESSINGS) ×2
ADPR CATH 15X14FR FL DRN BG (ADAPTER) ×2
APPLIER CLIP 5 13 M/L LIGAMAX5 (MISCELLANEOUS)
APPLIER CLIP ROT 10 11.4 M/L (STAPLE)
APR CLP MED LRG 11.4X10 (STAPLE)
APR CLP MED LRG 5 ANG JAW (MISCELLANEOUS)
BAG URO CATCHER STRL LF (MISCELLANEOUS) ×3 IMPLANT
BLADE EXTENDED COATED 6.5IN (ELECTRODE) ×3 IMPLANT
CANNULA REDUC XI 12-8 STAPL (CANNULA) ×3
CANNULA REDUCER 12-8 DVNC XI (CANNULA) ×2 IMPLANT
CATH FOLEY 2WAY SLVR  5CC 18FR (CATHETERS) ×3
CATH FOLEY 2WAY SLVR 5CC 18FR (CATHETERS) IMPLANT
CATH URET 5FR 28IN OPEN ENDED (CATHETERS) ×2 IMPLANT
CELLS DAT CNTRL 66122 CELL SVR (MISCELLANEOUS) IMPLANT
CLIP APPLIE 5 13 M/L LIGAMAX5 (MISCELLANEOUS) IMPLANT
CLIP APPLIE ROT 10 11.4 M/L (STAPLE) IMPLANT
CLIP VESOLOCK LG 6/CT PURPLE (CLIP) IMPLANT
CLIP VESOLOCK MED LG 6/CT (CLIP) IMPLANT
CLOTH BEACON ORANGE TIMEOUT ST (SAFETY) ×3 IMPLANT
COVER SURGICAL LIGHT HANDLE (MISCELLANEOUS) ×6 IMPLANT
COVER TIP SHEARS 8 DVNC (MISCELLANEOUS) ×2 IMPLANT
COVER TIP SHEARS 8MM DA VINCI (MISCELLANEOUS) ×3
COVER WAND RF STERILE (DRAPES) IMPLANT
DECANTER SPIKE VIAL GLASS SM (MISCELLANEOUS) ×3 IMPLANT
DERMABOND ADVANCED (GAUZE/BANDAGES/DRESSINGS) ×1
DERMABOND ADVANCED .7 DNX12 (GAUZE/BANDAGES/DRESSINGS) IMPLANT
DEVICE TROCAR PUNCTURE CLOSURE (ENDOMECHANICALS) IMPLANT
DRAIN CHANNEL 19F RND (DRAIN) ×2 IMPLANT
DRAPE ARM DVNC X/XI (DISPOSABLE) ×8 IMPLANT
DRAPE CARDIOVASC SPLIT 84X147 (DRAPES) ×1 IMPLANT
DRAPE CARDIOVASC SPLIT 88X140 (DRAPES) ×1 IMPLANT
DRAPE COLUMN DVNC XI (DISPOSABLE) ×2 IMPLANT
DRAPE DA VINCI XI ARM (DISPOSABLE) ×12
DRAPE DA VINCI XI COLUMN (DISPOSABLE) ×3
DRAPE SURG IRRIG POUCH 19X23 (DRAPES) ×3 IMPLANT
DRSG OPSITE POSTOP 4X10 (GAUZE/BANDAGES/DRESSINGS) IMPLANT
DRSG OPSITE POSTOP 4X6 (GAUZE/BANDAGES/DRESSINGS) ×1 IMPLANT
DRSG OPSITE POSTOP 4X8 (GAUZE/BANDAGES/DRESSINGS) IMPLANT
DRSG TEGADERM 2-3/8X2-3/4 SM (GAUZE/BANDAGES/DRESSINGS) ×15 IMPLANT
DRSG TEGADERM 4X4.75 (GAUZE/BANDAGES/DRESSINGS) ×3 IMPLANT
ELECT REM PT RETURN 15FT ADLT (MISCELLANEOUS) ×3 IMPLANT
ENDOLOOP SUT PDS II  0 18 (SUTURE)
ENDOLOOP SUT PDS II 0 18 (SUTURE) IMPLANT
EVACUATOR SILICONE 100CC (DRAIN) ×2 IMPLANT
GAUZE SPONGE 2X2 8PLY STRL LF (GAUZE/BANDAGES/DRESSINGS) ×2 IMPLANT
GAUZE SPONGE 4X4 12PLY STRL (GAUZE/BANDAGES/DRESSINGS) IMPLANT
GLOVE BIO SURGEON STRL SZ7.5 (GLOVE) ×9 IMPLANT
GLOVE BIO SURGEON STRL SZ8 (GLOVE) ×1 IMPLANT
GLOVE BIOGEL PI IND STRL 7.0 (GLOVE) IMPLANT
GLOVE BIOGEL PI INDICATOR 7.0 (GLOVE) ×2
GLOVE ECLIPSE 6.5 STRL STRAW (GLOVE) ×2 IMPLANT
GLOVE ECLIPSE 8.0 STRL XLNG CF (GLOVE) ×2 IMPLANT
GLOVE INDICATOR 8.0 STRL GRN (GLOVE) ×11 IMPLANT
GLOVE SURG SS PI 7.0 STRL IVOR (GLOVE) ×2 IMPLANT
GLOVE SURG SS PI 8.0 STRL IVOR (GLOVE) ×1 IMPLANT
GOWN STRL REUS W/TWL XL LVL3 (GOWN DISPOSABLE) ×20 IMPLANT
GRASPER SUT TROCAR 14GX15 (MISCELLANEOUS) IMPLANT
GUIDEWIRE STR DUAL SENSOR (WIRE) ×3 IMPLANT
HOLDER FOLEY CATH W/STRAP (MISCELLANEOUS) ×3 IMPLANT
KIT PROCEDURE DA VINCI SI (MISCELLANEOUS) ×3
KIT PROCEDURE DVNC SI (MISCELLANEOUS) IMPLANT
KIT PROCEDURE OLYMPUS (MISCELLANEOUS) ×1 IMPLANT
KIT TURNOVER KIT A (KITS) IMPLANT
LOOP CUT BIPOLAR 24F LRG (ELECTROSURGICAL) ×1 IMPLANT
MANIFOLD NEPTUNE II (INSTRUMENTS) ×3 IMPLANT
NDL INSUFFLATION 14GA 120MM (NEEDLE) ×2 IMPLANT
NEEDLE INSUFFLATION 14GA 120MM (NEEDLE) ×3 IMPLANT
PACK COLON (CUSTOM PROCEDURE TRAY) ×3 IMPLANT
PACK CYSTO (CUSTOM PROCEDURE TRAY) ×3 IMPLANT
PAD POSITIONING PINK XL (MISCELLANEOUS) ×3 IMPLANT
PENCIL SMOKE EVACUATOR (MISCELLANEOUS) IMPLANT
PORT LAP GEL ALEXIS MED 5-9CM (MISCELLANEOUS) ×3 IMPLANT
PROTECTOR NERVE ULNAR (MISCELLANEOUS) ×5 IMPLANT
RELOAD STAPLE 45 BLU REG DVNC (STAPLE) IMPLANT
RELOAD STAPLE 45 GRN THCK DVNC (STAPLE) IMPLANT
RELOAD STAPLE 60 4.3 GRN DVNC (STAPLE) IMPLANT
RELOAD STAPLER 4.3X60 GRN DVNC (STAPLE) ×4 IMPLANT
RETRACTOR WND ALEXIS 18 MED (MISCELLANEOUS) IMPLANT
RTRCTR WOUND ALEXIS 18CM MED (MISCELLANEOUS)
SCISSORS LAP 5X35 DISP (ENDOMECHANICALS) IMPLANT
SEAL CANN UNIV 5-8 DVNC XI (MISCELLANEOUS) ×8 IMPLANT
SEAL XI 5MM-8MM UNIVERSAL (MISCELLANEOUS) ×12
SEALER VESSEL DA VINCI XI (MISCELLANEOUS) ×3
SEALER VESSEL EXT DVNC XI (MISCELLANEOUS) ×2 IMPLANT
SET IRRIG TUBING LAPAROSCOPIC (IRRIGATION / IRRIGATOR) ×3 IMPLANT
SLEEVE ADV FIXATION 5X100MM (TROCAR) IMPLANT
SOLUTION ELECTROLUBE (MISCELLANEOUS) ×3 IMPLANT
SPONGE GAUZE 2X2 STER 10/PKG (GAUZE/BANDAGES/DRESSINGS) ×1
STAPLER 45 BLU RELOAD XI (STAPLE) IMPLANT
STAPLER 45 BLUE RELOAD XI (STAPLE)
STAPLER 45 GREEN RELOAD XI (STAPLE)
STAPLER 45 GRN RELOAD XI (STAPLE) IMPLANT
STAPLER 60 DA VINCI SURE FORM (STAPLE) ×3
STAPLER 60 SUREFORM DVNC (STAPLE) IMPLANT
STAPLER CANNULA SEAL DVNC XI (STAPLE) ×2 IMPLANT
STAPLER CANNULA SEAL XI (STAPLE) ×3
STAPLER ECHELON POWER CIR 29 (STAPLE) ×1 IMPLANT
STAPLER RELOAD 4.3X60 GREEN (STAPLE) ×6
STAPLER RELOAD 4.3X60 GRN DVNC (STAPLE) ×4
STAPLER SHEATH (SHEATH)
STAPLER SHEATH ENDOWRIST DVNC (SHEATH) ×2 IMPLANT
STOPCOCK 4 WAY LG BORE MALE ST (IV SETS) ×6 IMPLANT
SURGILUBE 2OZ TUBE FLIPTOP (MISCELLANEOUS) ×3 IMPLANT
SUT MNCRL AB 4-0 PS2 18 (SUTURE) ×3 IMPLANT
SUT PDS AB 1 CT1 27 (SUTURE) IMPLANT
SUT PDS AB 1 TP1 96 (SUTURE) IMPLANT
SUT PROLENE 0 CT 2 (SUTURE) IMPLANT
SUT PROLENE 2 0 KS (SUTURE) ×3 IMPLANT
SUT PROLENE 2 0 SH DA (SUTURE) IMPLANT
SUT SILK 2 0 (SUTURE)
SUT SILK 2 0 SH CR/8 (SUTURE) IMPLANT
SUT SILK 2-0 18XBRD TIE 12 (SUTURE) IMPLANT
SUT SILK 3 0 (SUTURE) ×3
SUT SILK 3 0 SH CR/8 (SUTURE) ×3 IMPLANT
SUT SILK 3-0 18XBRD TIE 12 (SUTURE) ×2 IMPLANT
SUT V-LOC BARB 180 2/0GR6 GS22 (SUTURE)
SUT VIC AB 3-0 SH 18 (SUTURE) IMPLANT
SUT VIC AB 3-0 SH 27 (SUTURE)
SUT VIC AB 3-0 SH 27XBRD (SUTURE) IMPLANT
SUT VICRYL 0 UR6 27IN ABS (SUTURE) ×3 IMPLANT
SUTURE V-LC BRB 180 2/0GR6GS22 (SUTURE) IMPLANT
SYR 10ML LL (SYRINGE) ×4 IMPLANT
SYS LAPSCP GELPORT 120MM (MISCELLANEOUS)
SYSTEM LAPSCP GELPORT 120MM (MISCELLANEOUS) IMPLANT
TAPE UMBILICAL COTTON 1/8X30 (MISCELLANEOUS) ×2 IMPLANT
TOWEL OR NON WOVEN STRL DISP B (DISPOSABLE) ×3 IMPLANT
TRAY FOLEY MTR SLVR 16FR STAT (SET/KITS/TRAYS/PACK) ×3 IMPLANT
TROCAR ADV FIXATION 5X100MM (TROCAR) ×3 IMPLANT
TUBING CONNECTING 10 (TUBING) ×9 IMPLANT
TUBING ENDO SMARTCAP CO2 EXT (MISCELLANEOUS) ×1 IMPLANT
TUBING ENDOGATOR IRRIG (IRRIGATION / IRRIGATOR) ×1 IMPLANT
TUBING INSUFFLATION 10FT LAP (TUBING) ×3 IMPLANT
TUBING UROLOGY SET (TUBING) IMPLANT

## 2019-08-01 NOTE — Progress Notes (Signed)
Message kept popping up that we needed order for a foley catheter so I had to put one in even though it was documented on the avatar.

## 2019-08-01 NOTE — Anesthesia Procedure Notes (Signed)
Procedure Name: Intubation Date/Time: 08/01/2019 7:43 AM Performed by: Glory Buff, CRNA Pre-anesthesia Checklist: Patient identified, Emergency Drugs available, Suction available and Patient being monitored Patient Re-evaluated:Patient Re-evaluated prior to induction Oxygen Delivery Method: Circle system utilized Preoxygenation: Pre-oxygenation with 100% oxygen Induction Type: IV induction Ventilation: Mask ventilation without difficulty Laryngoscope Size: Miller and 3 Grade View: Grade I Tube type: Oral Tube size: 7.0 mm Number of attempts: 1 Airway Equipment and Method: Stylet and Oral airway Placement Confirmation: ETT inserted through vocal cords under direct vision,  positive ETCO2 and breath sounds checked- equal and bilateral Secured at: 21 cm Tube secured with: Tape Dental Injury: Teeth and Oropharynx as per pre-operative assessment

## 2019-08-01 NOTE — Op Note (Signed)
Procedure: 1.  Cystoscopy with urethral dilation. 2.  Cystoscopy with bilateral retrograde pyelograms and interpretation. 3.  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 4.  Injection of firefly bilaterally. 5.  Application of fluoroscopy.  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  Surgeon: Dr. Irine Seal.  Anesthesia: General.  Specimen: Bladder tumor.  EBL: None.  Drain: Bilateral 5 French ureteral catheters and 18 French Foley catheter.  Complications: None.  Indications: I was asked to place bilateral ureteral catheters and inject firefly prior to a sigmoid colectomy to be done by Dr. Nadeen Landau.  Procedure: She was taken operating room and given antibiotics per general surgery.  A general anesthetic was induced.  She was placed in lithotomy position and fitted with PAS hose.  Her perineum and genitalia were prepped with Betadine solution she was draped in usual sterile fashion.  Cystoscopy was performed using the 23 [place] scope and 30 degree lens.  There was urethral stenosis but I was able to get the scope into the bladder.  Inspection of the bladder revealed a small tumor just adjacent to the left ureteral orifice.  The tumor was papillary and appeared low-grade.  No additional bladder wall lesions were noted and the right ureteral orifice was unremarkable.  The left ureteral orifice was cannulated with a 5 [place] open-ended catheter and contrast was instilled.  The left retrograde pyelogram revealed a normal caliber ureter and intrarenal collecting system without dilation or filling defects.  The ureteral catheter was left in place.  The cystoscope was removed and the urethra was dilated to 20 [place] with female sounds to allow passage of the 26 French continuous-flow resectoscope sheath.  This was fitted with an [place] handle, a  30 degree lens and a bipolar loop.  Saline was used the irrigant.  The small bladder tumor was then resected gently because of the proximity to the ureteral orifice.  The resection bed was fulgurated and the specimen was removed.  The tumor measured 12 mm.  The resectoscope was then removed and the cystoscope was replaced.  A second 5 French opening catheter was passed up the right ureteral orifice and contrast was instilled.  The decision to perform the retrograde pyelograms was because of the presence of the bladder tumor and the lack of a recent CT to assess the upper tracts.  The right retrograde pyelogram also demonstrated a normal caliber ureter and intrarenal collecting system without filling defects.  The ureteral catheter was then advanced to the kidney under fluoroscopic guidance and the cystoscope was removed.  7 mL of firefly solution was then injected slowly through the right ureteral catheter and then an additional 7 mL was injected slowly through the left ureteral catheter.  The ureteral catheters were then connected to a [place] device.  An 42 French Foley catheter was placed and also secured to the [place].  The ureteral catheters were secured to the Foley with a 2-0 silk tie.  And the Lyda Kalata was then placed to straight drainage.  The procedure was then turned over to Dr. Dema Severin for the colonic resection.  The bladder tumor was sent to pathology for evaluation.  There were no complications during my portion of the procedure.

## 2019-08-01 NOTE — Progress Notes (Signed)
Patient ID: Jennifer Franco, female   DOB: 02-Jun-1955, 64 y.o.   MRN: [id number]  She has some urgency this afternoon with the foley but it is draining well with slightly bloody urine.  I should be ok to d/c the foley in the morning.    I have discussed the findings for the small bladder tumor with her.  I will be in [place] tomorrow and won't be by.  Please message me in Epic if there are questions.

## 2019-08-01 NOTE — H&P (Signed)
CC: Referred by Dr. Cristina Gong for possible sigmoid/diverticular stricture  HPI: [name] is a very pelasant 78yoF with hx of HTN, hypothyroidism, GERD, Sjogren syndrome, fibromyalgia, who is referred today for evaluation of possible sigmoidal/diverticular stricture. She has a long-standing known history of diverticulosis but has also had recurrent left lower quadrant abdominal pains over the last year. Her most significant "attack" occurred in July of this year. She reports that 4 years ago she weighed 310 pounds and has been steadily intentionally losing 1-2 pounds per month for the last 4 years. She did this by altering her diet, decreasing her portion size, and eating a diet consisting of protein with significant amounts of fruits and vegetables. Her weight loss was steady and she had no abdominal complaints, pains, nausea/vomiting. When she developed her attack of pain in July, she did have significant crampy pains lasted for almost a month associated with intermittent nausea and vomiting. Over the ensuing one to 2 months, these subsequently abated. Things have been the best they have been for the last month now since July. She denies any issues at this point with fever/chills/nausea/vomiting. She is supplementing her diet with protein shakes.  Last albumin checked was August 2020 and was noted to be 3.8. She had a CT scan abdomen/pelvis done 1 year ago which demonstrated diverticulosis without evidence of superimposed diverticulitis. Incidentally noted 2.6 and a near left-sided adnexal cyst. This was "almost certainly benign" but follow-up imaging was recommended.  12/06/2018 she had a repeat CT abdomen and pelvis which showed findings compatible with acute diverticulitis of the sigmoid colon. No abscess or bowel obstruction. She had another CT scan performed 01/29/2019 of her abdomen and pelvis which showed moderate sigmoid diverticulosis with several changes in the pericolonic  fat adjacent to the sigmoid in the upper midline pelvis which may be partially chronic although mild acute diverticulitis is possible. Mild to moderate amount of free fluid in the pelvis. No perforation. 2 small liver hypodensities unchanged.  She has been followed with Dr. Cristina Gong humus recently attempted colonoscopy on her. He noted significant tortuosity and angulation of the rectosigmoid colon precluding visualization passed 18 cm. He tried with the UltraSling scope as well as with water insufflation but was unsuccessful. He ordered a barium enema 04/03/2019 which showed this to be a relatively limited study because unable to reflux contrast beyond the distal sigmoid colon. There was an area of severe stricturing at the rectosigmoid junction which was felt to be a possible neoplasm versus severe scarring from chronic diverticular disease.  No mass has been visualized on her CT scans. There is no clear intraluminal mass to the proximal most extent that the scope was performed.   PMH:HTN, hypothyroidism (well controlled on synthroid), GERD (partially controlled with PPI), Sjogren syndrome, fibromyalgia  PSH: C-section 2 via Pfannenstiel; tubal ligation  FHx: Denies FHx of malignancy  Social: Denies use of tobacco/EtOH/drugs. Her father was a Education officer, environmental whom passed away 08/28/06  ROS: A comprehensive 10 system review of systems was completed with the patient and pertinent findings as noted above.  Past Medical History:  Diagnosis Date  . Anemia   . Anginal pain (HCC)    anxiety  . Anxiety   . Chills   . CTS (carpal tunnel syndrome)    LEFT  . Cyclical vomiting   . Depression   . Diarrhea, unspecified   . Diverticulitis   . Fibromyalgia   . Hemorrhoids   . History of adenomatous polyp of colon   .  History of morbid obesity   . Hypothyroid   . Hypothyroidism   . Intractable nausea and vomiting   . Irregular bowel habits   . Joint pain   . LLQ abdominal pain     . Morbid obesity ([place])   . Nausea and vomiting   . Onychomycosis   . RA (rheumatoid arthritis) ([place])   . Reflux esophagitis   . Reflux esophagitis   . Sjogren's syndrome ([place])   . Tinea corporis   . Unspecified mood (affective) disorder ([place])   . Weight loss     Past Surgical History:  Procedure Laterality Date  . ADENOIDECTOMY    . CARPAL TUNNEL RELEASE Right   . CESAREAN SECTION    . CESAREAN SECTION    . wisdom teeth      Family History  Problem Relation Age of Onset  . Glaucoma Mother   . GER disease Mother   . Colon polyps Mother   . Breast cancer Other 40       Maternal Great Grandmother  . Osteoarthritis Father   . Alzheimer's disease Father   . Colon polyps Maternal Grandmother   . Colon polyps Maternal Grandfather     Social:  reports that she has quit smoking. She has never used smokeless tobacco. She reports current alcohol use. She reports that she does not use drugs.  Allergies:  Allergies  Allergen Reactions  . Etodolac Diarrhea  . Topamax [Topiramate] Other (See Comments)    Brain flutters     Medications: I have reviewed the patient's current medications.  No results found for this or any previous visit (from the past 48 hour(s)).  No results found.  ROS - all of the below systems have been reviewed with the patient and positives are indicated with bold text General: chills, fever or night sweats Eyes: blurry vision or double vision ENT: epistaxis or sore throat Allergy/Immunology: itchy/watery eyes or nasal congestion Hematologic/Lymphatic: bleeding problems, blood clots or swollen lymph nodes Endocrine: temperature intolerance or unexpected weight changes Breast: new or changing breast lumps or nipple discharge Resp: cough, shortness of breath, or wheezing CV: chest pain or dyspnea on exertion GI: as per HPI GU: dysuria, trouble voiding, or hematuria MSK: joint pain or joint stiffness Neuro: TIA or stroke symptoms Derm: pruritus and  skin lesion changes Psych: anxiety and depression  PE Blood pressure 111/63, pulse 70, temperature 98 F (36.7 C), temperature source Oral, resp. rate 16, height 5\' 3"  (1.6 m), weight 61.3 kg, SpO2 100 %. Constitutional: NAD; conversant; no deformities Eyes: Moist conjunctiva; no lid lag; anicteric; PERRL Neck: Trachea midline; no thyromegaly Lungs: Normal respiratory effort; no tactile fremitus CV: RRR; no palpable thrills; no pitting edema GI: Abd soft, NT/ND; no palpable hepatosplenomegaly MSK: Normal range of motion of extremities; no clubbing/cyanosis Psychiatric: Appropriate affect; alert and oriented x3 Lymphatic: No palpable cervical or axillary lymphadenopathy   A/P: [name] is very pleasant 25yoF with his of HTN, hypothyroidism, GERD, Sjogren syndrome, fibromyalgia - here with what is most likely diverticular stricture given her symptoms and overall clinical picture  -The anatomy and physiology of the GI tract was discussed at length with the patient. The pathophysiology of diverticulitis and strictures was discussed at length as well -Planning robotic-assisted sigmoidectomy, possible conversion to open surgery, possible ostomy based on intraoperative findings, flexible sigmoidoscopy, cystoscopy/ureteral stents/firefly with urology -With her imaging findings and chronic appearing changes, we spent time emphasizing the potential for an ostomy with this procedure including a  colostomy which could be permanent. We discussed intraoperative findings that may result in this including significant retroperitoneal/pelvic fibrosis. She expressed understanding -The planned procedure, material risks (including, but not limited to, pain, bleeding, infection, scarring, need for blood transfusion, damage to surrounding structures- blood vessels/nerves/viscus/organs, damage to ureter, urine leak, leak from anastomosis, need for additional procedures, need for stoma which may be permanent,  hernia, recurrence of diverticulitis, pneumonia, heart attack, stroke, death) benefits and alternatives to surgery were discussed at length. We discussed that long-term, without surgery, this can be progressive, and can ultimately result in obstruction of the GI tract. The patient's questions were answered to her satisfaction, she expressed understanding of all the above, and has elected to proceed with surgery.  Sharon [place]. Dema Severin, M.D. [facility]

## 2019-08-01 NOTE — H&P (Signed)
I will perform cystoscopy with ureteral catheterization with firefly injection for Dr. Dema Severin.  I have reviewed the risks of the procedure including bleeding, infection and ureteral injury.

## 2019-08-01 NOTE — Anesthesia Postprocedure Evaluation (Signed)
Anesthesia Post Note  Patient: GEORGIA BINES  Procedure(s) Performed: XI ROBOT ASSISTED LOEW ANTERIOR RESECTION;  LYSIS OF ADHESIONS X120MINUTES, LEFT SALPINGECTOMY (N/A Abdomen) FLEXIBLE SIGMOIDOSCOPY (N/A ) CYSTOSCOPY WITH URETHRAL DILATION, BILATERAL RETROGRADE PYELOGRAMS, TRANSURETHRAL RESECTION OF SMALL BLADDER TUMOR, INSERTION OF BILATERAL OPEN ENDED CATHETERS WITH BILATERAL INJECTION OF FIREFLY (N/A )     Patient location during evaluation: PACU Anesthesia Type: General Level of consciousness: awake and alert Pain management: pain level controlled Vital Signs Assessment: post-procedure vital signs reviewed and stable Respiratory status: spontaneous breathing, nonlabored ventilation and respiratory function stable Cardiovascular status: blood pressure returned to baseline and stable Postop Assessment: no apparent nausea or vomiting Anesthetic complications: no    Last Vitals:  Vitals:   08/01/19 1300 08/01/19 1315  BP: 134/69 131/70  Pulse: 64 65  Resp: 13 12  Temp:    SpO2: 100% 100%    Last Pain:  Vitals:   08/01/19 1315  TempSrc:   PainSc: Asleep                 Lynda Rainwater

## 2019-08-01 NOTE — Op Note (Signed)
08/01/2019  12:07 PM  PATIENT:  Jennifer Franco  64 y.o. female  Patient Care Team: Kelton Pillar, MD as PCP - General (Family Medicine)  PRE-OPERATIVE DIAGNOSIS:  Rectosigmoid diverticulitis  POST-OPERATIVE DIAGNOSIS:   1. Rectosigmoid diverticulitis 2. Bladder tumor  PROCEDURE:   1. Robotic low anterior resection with colorectal anastomosis 2. Robotic lysis of adhesions x 120 minutes 3. Left salpingectomy 4. Flexible sigmoidoscopy 5. Bilateral transversus abdominus plane blocks  SURGEON:  Sharon Mt. Dema Severin, MD  ASSISTANT: Michael Boston, MD  ANESTHESIA:   local and general  COUNTS:  Sponge, needle and instrument counts were reported correct x2 at the conclusion of the operation.  EBL: 100 mL  DRAINS: None  SPECIMEN:  1. Rectosigmoid colon with left adnexa 2. Distal donut (final distal margin)  COMPLICATIONS: None  FINDINGS: Small tumor in bladder near left ureteral orifice removed by Dr. Jeffie Pollock during cystoscopy with ureteral stent placement.  Please refer to his note.  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  Redundant loop of sigmoid laying over proximal rectum.  This was densely adherent to a portion of the left fallopian tube which was therefore removed with the specimen en bloc.  Multiple fluid-filled sacs in the pelvis consistent with loculated peritoneal fluid and reflecting her prior attacks of diverticulitis.  Dense pelvic adhesions which were thick, fibrotic and tenacious with retroperitoneal fibrosis and causing some medialization of both her ureters. This was all able to be freed up and removed.  Tension free, hemostatic, well perfused, airtight colorectal anastomosis fashioned at 10 cm from the anal verge by flexible sigmoidoscopy.  DISPOSITION: PACU in satisfactory condition  INDICATION: [name] is a very pelasant 62yoF with hx of HTN, hypothyroidism, GERD, Sjogren syndrome, fibromyalgia  with likely underlying diverticular stricture/chronic diverticulitis. She has a long-standing known history of diverticulosis but has also had recurrent left lower quadrant abdominal pains over the last year. Her most significant "attack" occurred in July of this year. She reports that 4 years ago she weighed 310 pounds and has been steadily intentionally losing 1-2 pounds per month for the last 4 years. She did this by altering her diet, decreasing her portion size, and eating a diet consisting of protein with significant amounts of fruits and vegetables. Her weight loss was steady and she had no abdominal complaints, pains, nausea/vomiting. When she developed her attack of pain in July, she did have significant crampy pains lasted for almost a month associated with intermittent nausea and vomiting. Over the ensuing one to 2 months, these subsequently abated. Things have been the best they have been for the last month now since July. She denies any issues at this point with fever/chills/nausea/vomiting. She is supplementing her diet with protein shakes.  She had multiple CT scans documenting findings consistent with distal sigmoid colon diverticulitis.  She had also undergone multiple attempts at endoscopic evaluation with Dr. Cristina Gong as well as with Gastrografin enemas which have not demonstrated any sort of significant luminal patency or that would accommodate a scope to be passed.  That said, she continued to tolerate a diet and did not appear clinically obstructed.  We had met multiple times in the office to discuss everything.  She opted to pursue surgery.  Please refer to notes elsewhere for details regarding all of these discussions.  DESCRIPTION: The patient was identified in preop holding and taken to the OR where she was placed on the operating room table. SCDs were placed. General endotracheal anesthesia was  induced without difficulty.  She was then positioned in low lithotomy using Allen  stirrups.  Pressure points were all evaluated and padded.  She was then prepped and draped in the usual sterile fashion for cystoscopy and ureteral stents by Dr. Jeffie Pollock.  Please refer to his notes for details regarding this portion of the procedure.  Of note, he did find a small tumor within the bladder that he was able to successfully remove.  After successful placement of ureteral stents and injection of ICG, she was was then prepped and draped in the usual sterile fashion for our portion of the procedure. A surgical timeout was performed indicating the correct patient, procedure, positioning and need for preoperative antibiotics.   An orogastric tube had been placed by anesthesia and was confirmed to be to suction.  At Palmer's point, a stab incision was created and the Veress needle was used to gain access to the peritoneal cavity.  Insufflation commenced with CO2 to a maximum pressure of 15 mmHg.  After assessing appropriate pneumoperitoneum, just to the right and above the umbilicus, an 8 mm incision was created and a blunt tipped 8 mm robotic trocar carefully placed.  The laparoscope was then inserted and demonstrated no evidence of trocar site or Veress needle site complications.  She did have some omentum extending up to her prior umbilical incision from her tubal ligation. 3 additional 8 mm trochars were then placed in a curvilinear manner along the line extending from the left upper quadrant to the right ASIS.  A 5 mm assist port was placed in the right lateral abdominal wall.  All of these were placed under direct visualization.  The pelvis was surveyed and small bowel reflected from.  Bilateral transversus abdominis plane blocks were then created using a mixture of Exparel with Marcaine.  At the site of her prior Pfannenstiel incision, a 12 mm robotic trocar was placed under direct visualization.  She was then positioned in Trendelenburg with left side up.  The robot was then docked.  I then went to  the console.  Omental adhesions up towards the umbilicus were carefully taken down sharply.  The omentum was inspected and noted to be hemostatic.  Attention was then turned to the pelvis.  The rectosigmoid colon was densely adherent to the left adnexa and uterus.  A lateral to medial approach was first selected.  The more healthy portion of sigmoid colon was mobilized off of the intersigmoid fossa.  The left ureter was identified and kept "down."  The sigmoid and descending colon were then mobilized medially by taking down the Bryli Mantey line of Toldt sharply.  Then working down towards the pelvis, the sigmoid colon was grasped.  There were dense adhesions within the pelvis.  She had significant retroperitoneal fibrosis related to this chronic inflammatory process.  The sigmoid colon was carefully mobilized.  Both the left and right ureters were identified during this mobilization and protected free of injury.  Dense pelvic adhesions existed between the uterus and the sigmoid colon as well as between the sigmoid colon and both left and right adnexal structures.  The left fallopian tube was intimately associated with the sigmoid colon.  Her ureters were somewhat medialized.  Both the left and right ureter were carefully observed throughout this procedure.  After reconfirming the location of the left ureter a portion of the left adnexa was included with the sigmoid colon using the vessel sealer to divide it.  Ultimately, a C-loop of sigmoid was present within the  pelvis and adherent to the proximal portion of the rectum.  This necessitated a low anterior resection.  Working more cephalad, the IMA pedicle was readily apparent.  This was elevated and the peritoneum overlying the sacral promontory was incised sharply.  The IMA was circumferentially dissected and the left ureter was identified and confirmed to be down.  The IMA was then ligated using the vessel sealer.  The stump was then inspected and noted to be  hemostatic with a good seal.  This plane was continued distally into the pelvis.  The mesentery is also freed proximally up to the level of the splenic flexure.  The Astor Gentle line has been divided at this level as well.  She had significant redundancy of her descending colon which allowed for significant reach into her deep pelvis.  After having freed the sigmoid colon from the pelvis, the rectum was identified below the diseased segment where there was a circumferential healthy-appearing rectal wall.  The tinea had already splayed at this level.  There were no appendices epiploica or diverticula that were apparent.  The mesorectum was then ligated using the vessel sealer.  After clearing the mesorectum, attention was turned to the proximal point of transection.  The mesentery was then divided out along the IMA pedicle to the level of the distal descending colon.  The marginal artery was visible and preserved.  Attention was then turned to performing ICG perfusion test.  After this was administered, it was apparent that she had excellent perfusion of the colon out to the level of the planned location of transection proximally and the rectum was well perfused to the point of mesenteric clearing distally.  A 60 mm robotic green load stapler was then used to divide the proximal rectum.  The staple line was complete and intact.  The pelvis was then irrigated and hemostasis verified.  Both ureters were again reinspected and noted to be free of injury.  The abdomen was surveyed and noted to be hemostatic.  The robot was then undocked.  Attention was turned to the extracorporeal portion of the procedure.  I scrubbed back in.  A Pfannenstiel incision was created by extending the 12 mm trocar site.  She had significant scar tissue here from her prior Pfannenstiel incisions.  The rectus sheath was identified and incised.  Flaps were created cephalad and caudad.  The peritoneum was then opened carefully.  An [place] wound  protector was placed.  The specimen was delivered out through the wound protector.  The proximal point of transection was identified where the mesentery had been cleared.  A pursestring device was then placed in the pursestring created by passing a 2-0 Prolene on a Keith needle.  The colon was transected and passed off the specimen with the open end being proximal.  "Belt loops" consisting of 3-0 silk suture were placed around the pursestring line.  EEA sizers were then used and a 29 mm EEA stapler was selected.  The anvil was placed and the pursestring tied.  There is a small amount of fat which was carefully cleared.  There were no diverticula at this level.  The colon was well perfused in appearance.  There was a palpable pulse in the marginal.  This was placed back in the abdomen and laid within the pelvis without any retraction.  The wound protector Was placed and pneumoperitoneum reestablished.  I then went below to pass the stapler.  Under direct visualization, EEA sizers were passed and did so without difficulty.  The 29 mm stapler was then passed and the spike deployed.  The components were then mated and the stapler closed.  Orientation was confirmed such that there is no twisting of the descending colon or bowel underneath the cut edge of the mesentery.  The stapler was then closed, held, and fired.  This was then removed.  The donuts were inspected and noted to be complete and full.  The distal donut was passed off his final distal margin.  Of note, there was also a calcified appearing free-floating ball in the pelvis consistent with prior auto-infarcted appendices epiploica in the pelvis that was also removed and passed off with the specimen.  The colon proximally anastomosis was then clamped carefully.  The pelvis was filled with sterile saline.  I then passed the flexible sigmoidoscope through the anus under direct visualization.  The anastomosis was readily identified and noted to be complete,  airtight, and hemostatic.  This rests approximately 10 cm from the anal verge by flexible sigmoidoscopy.  The scope was withdrawn and the irrigation of the pelvis was evacuated.  Trochars were removed under direct visualization and the sites hemostatic.  At this point, all equipment was exchanged for clean gowns, gloves, instruments, and additional sterile drapes were placed.  Attention was then turned to closing the abdomen.  At the Pfannenstiel site, the peritoneum was closed using a running 0 Vicryl suture.  The transverse fascial incision was then closed using 2 running #1 PDS sutures.  The fascia was palpated and noted to be completely closed.  Additional local anesthetic was infiltrated in the wound irrigated.  The skin of all incision sites was then closed with 4-0 Monocryl subcuticular suture.  Dermabond was then applied.  She was then taken out of lithotomy, awakened from general anesthesia, extubated, and transferred to a stretcher for transport to PACU in satisfactory condition.

## 2019-08-01 NOTE — Discharge Instructions (Signed)
Transurethral Resection of Bladder Tumor, Care After This sheet gives you information about how to care for yourself after your procedure. Your health care provider may also give you more specific instructions. If you have problems or questions, contact your health care provider. What can I expect after the procedure? After the procedure, it is common to have:  A small amount of blood in your urine for up to 2 weeks.  Soreness or mild pain from your catheter. After your catheter is removed, you may have mild soreness, especially when urinating.  Pain in your lower abdomen. Follow these instructions at home: Medicines   Take over-the-counter and prescription medicines only as told by your health care provider.  If you were prescribed an antibiotic medicine, take it as told by your health care provider. Do not stop taking the antibiotic even if you start to feel better.  Do not drive for 24 hours if you were given a sedative during your procedure.  Ask your health care provider if the medicine prescribed to you: ? Requires you to avoid driving or using heavy machinery. ? Can cause constipation. You may need to take these actions to prevent or treat constipation:  Take over-the-counter or prescription medicines.  Eat foods that are high in fiber, such as beans, whole grains, and fresh fruits and vegetables.  Limit foods that are high in fat and processed sugars, such as fried or sweet foods. Activity  Return to your normal activities as told by your health care provider. Ask your health care provider what activities are safe for you.  Do not lift anything that is heavier than 10 lb (4.5 kg), or the limit that you are told, until your health care provider says that it is safe.  Avoid intense physical activity for as long as told by your health care provider.  Rest as told by your health care provider.  Avoid sitting for a long time without moving. Get up to take short walks every  1-2 hours. This is important to improve blood flow and breathing. Ask for help if you feel weak or unsteady. General instructions   Do not drink alcohol for as long as told by your health care provider. This is especially important if you are taking prescription pain medicines.  Do not take baths, swim, or use a hot tub until your health care provider approves. Ask your health care provider if you may take showers. You may only be allowed to take sponge baths.  If you have a catheter, follow instructions from your health care provider about caring for your catheter and your drainage bag.  Drink enough fluid to keep your urine pale yellow.  Wear compression stockings as told by your health care provider. These stockings help to prevent blood clots and reduce swelling in your legs.  Keep all follow-up visits as told by your health care provider. This is important. ? You will need to be followed closely with regular checks of your bladder and urethra (cystoscopies) to make sure that the cancer does not come back. Contact a health care provider if:  You have pain that gets worse or does not improve with medicine.  You have blood in your urine for more than 2 weeks.  You have cloudy or bad-smelling urine.  You become constipated. Signs of constipation may include having: ? Fewer than three bowel movements in a week. ? Difficulty having a bowel movement. ? Stools that are dry, hard, or larger than normal.  You have  a fever. Get help right away if:  You have: ? Severe pain. ? Bright red blood in your urine. ? Blood clots in your urine. ? A lot of blood in your urine.  Your catheter has been removed and you are not able to urinate.  You have a catheter in place and the catheter is not draining urine. Summary  After your procedure, it is common to have a small amount of blood in your urine, soreness or mild pain from your catheter, and pain in your lower abdomen.  Take  over-the-counter and prescription medicines only as told by your health care provider.  Rest as told by your health care provider. Follow your health care provider's instructions about returning to normal activities. Ask what activities are safe for you.  If you have a catheter, follow instructions from your health care provider about caring for your catheter and your drainage bag.  Get help right away if you cannot urinate, you have severe pain, or you have bright red blood or blood clots in your urine. This information is not intended to replace advice given to you by your health care provider. Make sure you discuss any questions you have with your health care provider. Document Revised: 11/23/2017 Document Reviewed: 11/23/2017 Elsevier Patient Education  [place] OP INSTRUCTIONS AFTER COLON SURGERY  1. DIET: Be sure to include lots of fluids daily to stay hydrated - 64oz of water per day (8, 8 oz glasses).  Avoid fast food or heavy meals for the first couple of weeks as your are more likely to get nauseated. Avoid raw/uncooked fruits or vegetables for the first 4 weeks (its ok to have these if they are blended into smoothie form). If you have fruits/vegetables, make sure they are cooked until soft enough to mash on the roof of your mouth and chew your food well. Otherwise, diet as tolerated.  2. Take your usually prescribed home medications unless otherwise directed.  3. PAIN CONTROL: a. Pain is best controlled by a usual combination of three different methods TOGETHER: i. Ice/Heat ii. Over the counter pain medication iii. Prescription pain medication b. Most patients will experience some swelling and bruising around the surgical site.  Ice packs or heating pads (30-60 minutes up to 6 times a day) will help. Some people prefer to use ice alone, heat alone, alternating between ice & heat.  Experiment to what works for you.  Swelling and bruising can take several weeks to  resolve.   c. It is helpful to take an over-the-counter pain medication regularly for the first few weeks: i. Ibuprofen (Motrin/Advil) - 200mg  tabs - take 3 tabs (600mg ) every 6 hours as needed for pain (unless you have been directed previously to avoid NSAIDs/ibuprofen) ii. Acetaminophen (Tylenol) - you may take 650mg  every 6 hours as needed. You can take this with motrin as they act differently on the body. If you are taking a narcotic pain medication that has acetaminophen in it, do not take over the counter tylenol at the same time. iii. NOTE: You may take both of these medications together - most patients  find it most helpful when alternating between the two (i.e. Ibuprofen at 6am, tylenol at 9am, ibuprofen at 12pm ...) d. A  prescription for pain medication should be given to you upon discharge.  Take your pain medication as prescribed if your pain is not adequatly controlled with the over-the-counter pain reliefs mentioned above.  4. Avoid getting constipated.  Between  the surgery and the pain medications, it is common to experience some constipation.  Increasing fluid intake and taking a fiber supplement (such as Metamucil, Citrucel, FiberCon, MiraLax, etc) 1-2 times a day regularly will usually help prevent this problem from occurring.  A mild laxative (prune juice, Milk of Magnesia, MiraLax, etc) should be taken according to package directions if there are no bowel movements after 48 hours.    5. Dressing: Your incisions are covered in Dermabond which is like sterile superglue for the skin. This will come off on it's own in a couple weeks. It is waterproof and you may bathe normally starting the day after your surgery in a shower. Avoid baths/pools/lakes/oceans until your wounds have fully healed.  6. ACTIVITIES as tolerated:   a. Avoid heavy lifting (>10lbs or 1 gallon of milk) for the next 6 weeks. b. You may resume regular daily activities as tolerated--such as daily self-care, walking,  climbing stairs--gradually increasing activities as tolerated.  If you can walk 30 minutes without difficulty, it is safe to try more intense activity such as jogging, treadmill, bicycling, low-impact aerobics.  c. DO NOT PUSH THROUGH PAIN.  Let pain be your guide: If it hurts to do something, don't do it. d. Dennis Bast may drive when you are no longer taking prescription pain medication, you can comfortably wear a seatbelt, and you can safely maneuver your car and apply brakes. e. Dennis Bast may have sexual intercourse when it is comfortable.   7. FOLLOW UP in our office a. Please call [facility] at [phone] to set up an appointment to see your surgeon in the office for a follow-up appointment approximately 2 weeks after your surgery. b. Make sure that you call for this appointment the day you arrive home to insure a convenient appointment time.  9. If you have disability or family leave forms that need to be completed, you may have them completed by your primary care physician's office; for return to work instructions, please ask our office staff and they will be happy to assist you in obtaining this documentation   When to call us [phone]: 1. Poor pain control 2. Reactions / problems with new medications (rash/itching, etc)  3. Fever over 101.5 F (38.5 C) 4. Inability to urinate 5. Nausea/vomiting 6. Worsening swelling or bruising 7. Continued bleeding from incision. 8. Increased pain, redness, or drainage from the incision  The clinic staff is available to answer your questions during regular business hours (8:30am-5pm).  Please dont hesitate to call and ask to speak to one of our nurses for clinical concerns.   A surgeon from [facility] is always on call at the hospitals   If you have a medical emergency, go to the nearest emergency room or call 911.  [facility], [place] [address] [place], [place], [place]  [address] MAIN: [phone] FAX: [phone] [email or web address]

## 2019-08-01 NOTE — Anesthesia Preprocedure Evaluation (Addendum)
Anesthesia Evaluation  Patient identified by MRN, date of birth, ID band Patient awake    Reviewed: Allergy & Precautions, NPO status , Patient's Chart, lab work & pertinent test results  Airway Mallampati: II  TM Distance: >3 FB Neck ROM: Full    Dental no notable dental hx.    Pulmonary neg pulmonary ROS, former smoker,    Pulmonary exam normal breath sounds clear to auscultation       Cardiovascular negative cardio ROS Normal cardiovascular exam Rhythm:Regular Rate:Normal     Neuro/Psych Anxiety Depression negative neurological ROS  negative psych ROS   GI/Hepatic negative GI ROS, Neg liver ROS,   Endo/Other  Hypothyroidism   Renal/GU negative Renal ROS  negative genitourinary   Musculoskeletal  (+) Arthritis , Osteoarthritis,  Fibromyalgia -  Abdominal   Peds negative pediatric ROS (+)  Hematology negative hematology ROS (+)   Anesthesia Other Findings   Reproductive/Obstetrics negative OB ROS                             Anesthesia Physical Anesthesia Plan  ASA: II  Anesthesia Plan: General   Post-op Pain Management:    Induction: Intravenous  PONV Risk Score and Plan: 3 and Ondansetron, Dexamethasone, Midazolam and Treatment may vary due to age or medical condition  Airway Management Planned: Oral ETT  Additional Equipment:   Intra-op Plan:   Post-operative Plan: Extubation in OR  Informed Consent: I have reviewed the patients History and Physical, chart, labs and discussed the procedure including the risks, benefits and alternatives for the proposed anesthesia with the patient or authorized representative who has indicated his/her understanding and acceptance.     Dental advisory given  Plan Discussed with: CRNA  Anesthesia Plan Comments:         Anesthesia Quick Evaluation

## 2019-08-01 NOTE — Transfer of Care (Signed)
Immediate Anesthesia Transfer of Care Note  Patient: Jennifer Franco  Procedure(s) Performed: XI ROBOT ASSISTED LOEW ANTERIOR RESECTION;  LYSIS OF ADHESIONS X120MINUTES, LEFT SALPINGECTOMY (N/A Abdomen) FLEXIBLE SIGMOIDOSCOPY (N/A ) CYSTOSCOPY WITH URETHRAL DILATION, BILATERAL RETROGRADE PYELOGRAMS, TRANSURETHRAL RESECTION OF SMALL BLADDER TUMOR, INSERTION OF BILATERAL OPEN ENDED CATHETERS WITH BILATERAL INJECTION OF FIREFLY (N/A )  Patient Location: PACU  Anesthesia Type:General  Level of Consciousness: drowsy, patient cooperative and responds to stimulation  Airway & Oxygen Therapy: Patient Spontanous Breathing and Patient connected to face mask oxygen  Post-op Assessment: Report given to RN and Post -op Vital signs reviewed and stable  Post vital signs: Reviewed and stable  Last Vitals:  Vitals Value Taken Time  BP 177/77 08/01/19 1210  Temp    Pulse 59 08/01/19 1213  Resp 15 08/01/19 1213  SpO2 100 % 08/01/19 1213  Vitals shown include unvalidated device data.  Last Pain:  Vitals:   08/01/19 0626  TempSrc:   PainSc: 0-No pain         Complications: No apparent anesthesia complications

## 2019-08-01 NOTE — Progress Notes (Signed)
Pt attempted to get up to walk, got diaphoretic and vomited a little bit.  Gave medication and she said she will try again later.  She is motivated to move.

## 2019-08-02 LAB — BASIC METABOLIC PANEL
Anion gap: 9 (ref 5–15)
BUN: 8 mg/dL (ref 8–23)
CO2: 24 mmol/L (ref 22–32)
Calcium: 9.1 mg/dL (ref 8.9–10.3)
Chloride: 108 mmol/L (ref 98–111)
Creatinine, Ser: 0.54 mg/dL (ref 0.44–1.00)
GFR calc Af Amer: >60 mL/min (ref >60)
GFR calc non Af Amer: >60 mL/min (ref >60)
Glucose, Bld: 106 mg/dL — ABNORMAL HIGH (ref 70–99)
Potassium: 3.9 mmol/L (ref 3.5–5.1)
Sodium: 141 mmol/L (ref 135–145)

## 2019-08-02 LAB — CBC
HCT: 34 % — ABNORMAL LOW (ref 36.0–46.0)
Hemoglobin: 10.9 g/dL — ABNORMAL LOW (ref 12.0–15.0)
MCH: 28.2 pg (ref 26.0–34.0)
MCHC: 32.1 g/dL (ref 30.0–36.0)
MCV: 88.1 fL (ref 80.0–100.0)
Platelets: 243 10*3/uL (ref 150–400)
RBC: 3.86 MIL/uL — ABNORMAL LOW (ref 3.87–5.11)
RDW: 15.2 % (ref 11.5–15.5)
WBC: 7.3 10*3/uL (ref 4.0–10.5)
nRBC: 0 % (ref 0.0–0.2)

## 2019-08-02 LAB — MAGNESIUM: Magnesium: 1.7 mg/dL (ref 1.7–2.4)

## 2019-08-02 LAB — PHOSPHORUS: Phosphorus: 3.6 mg/dL (ref 2.5–4.6)

## 2019-08-02 MED ORDER — TRAMADOL HCL 50 MG PO TABS
50.0000 mg | ORAL_TABLET | Freq: Four times a day (QID) | ORAL | Status: DC | PRN
  Administered 2019-08-02 (×2): 50 mg via ORAL
  Filled 2019-08-02 (×2): qty 1

## 2019-08-02 MED ORDER — TRAMADOL HCL 50 MG PO TABS: 50.0000 mg | ORAL_TABLET | Freq: Four times a day (QID) | ORAL | 0 refills | Status: AC | PRN

## 2019-08-02 NOTE — Progress Notes (Signed)
Subjective No acute events. Feeling well. Denies complaints. Had BM this morning. Denies n/v, taking liquids in ok. Ambulating. Pain well controlled. Foley removed this morning.  Objective: Vital signs in last 24 hours: Temp:  [97.3 F (36.3 C)-99.5 F (37.5 C)] 98.6 F (37 C) (03/26 0548) Pulse Rate:  [55-92] 65 (03/26 0548) Resp:  [12-20] 18 (03/26 0548) BP: (107-177)/(60-77) 124/67 (03/26 0548) SpO2:  [99 %-100 %] 100 % (03/26 0548) Weight:  [61.2 kg] 61.2 kg (03/26 0548) Last BM Date: 08/01/19  Intake/Output from previous day: 03/25 0701 - 03/26 0700 In: 3484.4 [P.O.:120; I.V.:3264.4; IV Piggyback:100] Out: 1930 [Urine:1830; Blood:100] Intake/Output this shift: No intake/output data recorded.  Gen: NAD, comfortable CV: RRR Pulm: Normal work of breathing Abd: Soft, minimal tenderness, nondistended; incisions c/d/i without erythema or drainage Ext: SCDs in place  Lab Results: CBC  Recent Labs    08/02/19 0512  WBC 7.3  HGB 10.9*  HCT 34.0*  PLT 243   BMET Recent Labs    08/02/19 0512  NA 141  K 3.9  CL 108  CO2 24  GLUCOSE 106*  BUN 8  CREATININE 0.54  CALCIUM 9.1   PT/INR No results for input(s): LABPROT, INR in the last 72 hours. ABG No results for input(s): PHART, HCO3 in the last 72 hours.  Invalid input(s): PCO2, PO2  Studies/Results:  Anti-infectives: Anti-infectives (From admission, onward)   Start     Dose/Rate Route Frequency Ordered Stop   08/02/19 1000  hydroxychloroquine (PLAQUENIL) tablet 200 mg     200 mg Oral 2 times daily 08/01/19 1419     08/01/19 1400  neomycin (MYCIFRADIN) tablet 1,000 mg  Status:  Discontinued     1,000 mg Oral 3 times per day 08/01/19 0552 08/01/19 0602   08/01/19 1400  metroNIDAZOLE (FLAGYL) tablet 1,000 mg  Status:  Discontinued     1,000 mg Oral 3 times per day 08/01/19 [id number] 08/01/19 0602   08/01/19 0602  sodium chloride 0.9 % with cefoTEtan (CEFOTAN) ADS Med    Note to Pharmacy: Kyra Leyland   :  cabinet override      08/01/19 0602 08/01/19 0802   08/01/19 0600  cefoTEtan (CEFOTAN) 2 g in sodium chloride 0.9 % 100 mL IVPB     2 g 200 mL/hr over 30 Minutes Intravenous On call to O.R. 08/01/19 0552 08/01/19 0814       Assessment/Plan: Patient Active Problem List   Diagnosis Date Noted  . Status post laparoscopic-assisted sigmoidectomy 08/01/2019   s/p Procedure(s): XI ROBOT ASSISTED LOEW ANTERIOR RESECTION;  LYSIS OF ADHESIONS X120MINUTES, LEFT SALPINGECTOMY FLEXIBLE SIGMOIDOSCOPY CYSTOSCOPY WITH URETHRAL DILATION, BILATERAL RETROGRADE PYELOGRAMS, TRANSURETHRAL RESECTION OF SMALL BLADDER TUMOR, INSERTION OF BILATERAL OPEN ENDED CATHETERS WITH BILATERAL INJECTION OF FIREFLY 08/01/2019  -Recovering well -Advance to soft diet -Ambulate 5x/day -D/C Entereg -D/C IVF if tolerating soft diet -We spent time today reviewing her surgery, operative findings and plans moving forward -PPx: SQH, SCDs   LOS: 1 day   Sharon Mt. Dema Severin, M.D. [facility] Use [email or web address] to contact on call provider

## 2019-08-02 NOTE — Progress Notes (Signed)
Pharmacy Brief Note - Alvimopan (Entereg)  The standing order set for alvimopan (Entereg) now includes an automatic order to discontinue the drug after the patient has had a bowel movement. The change was approved by the [place] and the Medical Executive Committee.   This patient has had bowel movements documented by nursing. Therefore, alvimopan has been discontinued. If there are questions, please contact the pharmacy at [phone].   Thank you- Dolly Rias RPh 08/02/2019, 11:24 AM

## 2019-08-03 LAB — BASIC METABOLIC PANEL
Anion gap: 7 (ref 5–15)
BUN: 8 mg/dL (ref 8–23)
CO2: 25 mmol/L (ref 22–32)
Calcium: 8.8 mg/dL — ABNORMAL LOW (ref 8.9–10.3)
Chloride: 109 mmol/L (ref 98–111)
Creatinine, Ser: 0.51 mg/dL (ref 0.44–1.00)
GFR calc Af Amer: >60 mL/min (ref >60)
GFR calc non Af Amer: >60 mL/min (ref >60)
Glucose, Bld: 99 mg/dL (ref 70–99)
Potassium: 3.7 mmol/L (ref 3.5–5.1)
Sodium: 141 mmol/L (ref 135–145)

## 2019-08-03 LAB — CBC
HCT: 31.9 % — ABNORMAL LOW (ref 36.0–46.0)
Hemoglobin: 10.3 g/dL — ABNORMAL LOW (ref 12.0–15.0)
MCH: 28.7 pg (ref 26.0–34.0)
MCHC: 32.3 g/dL (ref 30.0–36.0)
MCV: 88.9 fL (ref 80.0–100.0)
Platelets: 207 10*3/uL (ref 150–400)
RBC: 3.59 MIL/uL — ABNORMAL LOW (ref 3.87–5.11)
RDW: 15.1 % (ref 11.5–15.5)
WBC: 5.3 10*3/uL (ref 4.0–10.5)
nRBC: 0 % (ref 0.0–0.2)

## 2019-08-03 NOTE — Discharge Summary (Signed)
Physician Discharge Summary  Patient ID: Jennifer Franco MRN: [id number] DOB/AGE: [birth year] 62 y.o.  Admit date: 08/01/2019 Discharge date: 08/03/2019  Admission Diagnoses:  Discharge Diagnoses:  Active Problems:   Status post laparoscopic-assisted sigmoidectomy   Discharged Condition: good  Hospital Course: uneventful post op recovery.  Quick return of bowel function.  Pain controlled.  Tolerating po.  Discharged home POD#2  Consults: None  Significant Diagnostic Studies:   Treatments: surgery: lap assisted sigmoidectomy  Discharge Exam: Blood pressure 114/69, pulse (!) 52, temperature 98.6 F (37 C), temperature source Oral, resp. rate 18, height 5\' 3"  (1.6 m), weight 61.2 kg, SpO2 100 %. General appearance: alert, cooperative and no distress Resp: clear to auscultation bilaterally Cardio: regular rate and rhythm, S1, S2 normal, no murmur, click, rub or gallop Incision/Wound:abdomen soft, dressing dry  Disposition: Discharge disposition: 01-Home or Self Care        Allergies as of 08/03/2019      Reactions   Etodolac Diarrhea   Topamax [topiramate] Other (See Comments)   Brain flutters       Medication List    STOP taking these medications   HYDROcodone-acetaminophen 5-325 MG tablet Commonly known as: NORCO/VICODIN   oxyCODONE-acetaminophen 5-325 MG tablet Commonly known as: Percocet     TAKE these medications   folic acid 1 MG tablet Commonly known as: FOLVITE Take 1 mg by mouth daily.   hydroxychloroquine 200 MG tablet Commonly known as: PLAQUENIL Take 200 mg by mouth 2 (two) times daily.   ibuprofen 200 MG tablet Commonly known as: ADVIL Take 400 mg by mouth every 8 (eight) hours as needed for moderate pain.   iron polysaccharides 150 MG capsule Commonly known as: NIFEREX Take 150 mg by mouth daily.   Krill Oil 500 MG Caps Take 500 mg by mouth at bedtime.   levothyroxine 125 MCG tablet Commonly known as: SYNTHROID Take 125 mcg by  mouth daily before breakfast.   Olive Leaf Extract 250 MG Caps Take 250 capsules by mouth at bedtime.   ondansetron 4 MG disintegrating tablet Commonly known as: Zofran ODT Take 1 tablet (4 mg total) by mouth every 8 (eight) hours as needed for nausea or vomiting.   promethazine 25 MG tablet Commonly known as: PHENERGAN Take 1 tablet (25 mg total) by mouth every 6 (six) hours as needed for nausea or vomiting.   sucralfate 1 g tablet Commonly known as: CARAFATE Take 1 tablet by mouth 2 (two) times daily.   traMADol 50 MG tablet Commonly known as: Ultram Take 1 tablet (50 mg total) by mouth every 6 (six) hours as needed for up to 5 days (severe pain not controlled with tylenol and ibuprofen).      Follow-up Information    Irine Seal, MD.   Specialty: Urology Why: My office will call to arrange f/u in 3-4 weeks.  Contact information: [place] [address] [phone]        Ileana Roup, MD. Go on 08/26/2019.   Specialty: General Surgery Why: Appointment is at 3:45. Please arrive at least 15 minutes prior to your scheduled appointment to avoid cancellation. Contact information: [place] [address] [phone]        Nona Dell, DO .   Specialty: Internal Medicine Contact information: [address] [place] [address] [phone]           Signed: Coralie Keens 08/03/2019, 9:29 AM

## 2019-08-03 NOTE — Progress Notes (Signed)
Discharge instructions discussed with patient and family verbalized agreement and understanding

## 2019-08-03 NOTE — Progress Notes (Signed)
Patient ID: Jennifer Franco, female   DOB: 02-02-1956, 64 y.o.   MRN: [id number]   Doing well Tolerating po Having BM's Abdomen soft, dressing dry  Plan: Discharge home

## 2019-08-05 ENCOUNTER — Other Ambulatory Visit: Payer: Self-pay | Admitting: Physician Assistant

## 2019-08-23 LAB — SURGICAL PATHOLOGY

## 2019-09-10 ENCOUNTER — Ambulatory Visit: Payer: BC Managed Care – PPO | Attending: Surgery | Admitting: Physical Therapy

## 2019-09-10 ENCOUNTER — Other Ambulatory Visit: Payer: Self-pay

## 2019-09-10 ENCOUNTER — Encounter: Payer: Self-pay | Admitting: Physical Therapy

## 2019-09-10 DIAGNOSIS — M25552 Pain in left hip: Secondary | ICD-10-CM | POA: Insufficient documentation

## 2019-09-10 DIAGNOSIS — M6281 Muscle weakness (generalized): Secondary | ICD-10-CM | POA: Diagnosis present

## 2019-09-10 NOTE — Therapy (Signed)
[facility] [address] [place], [place], [address] Phone: [phone]   Fax:  [phone]  Physical Therapy Evaluation  Patient Details  Name: Jennifer Franco MRN: [id number] Date of Birth: Jul 26, 1955 Referring Provider (PT): Ileana Roup, MD   Encounter Date: 09/10/2019  PT End of Session - 09/10/19 1711    Visit Number  1    Date for PT Re-Evaluation  12/03/19    Authorization Type  Medicare    Progress Note Due on Visit  10    PT Start Time  1550    PT Stop Time  1650    PT Time Calculation (min)  60 min    Activity Tolerance  Patient tolerated treatment well    Behavior During Therapy  [facility] for tasks assessed/performed       Past Medical History:  Diagnosis Date  . Anemia   . Anginal pain (HCC)    anxiety  . Anxiety   . Chills   . CTS (carpal tunnel syndrome)    LEFT  . Cyclical vomiting   . Depression   . Diarrhea, unspecified   . Diverticulitis   . Fibromyalgia   . Hemorrhoids   . History of adenomatous polyp of colon   . History of morbid obesity   . Hypothyroid   . Hypothyroidism   . Intractable nausea and vomiting   . Irregular bowel habits   . Joint pain   . LLQ abdominal pain   . Morbid obesity ([place])   . Nausea and vomiting   . Onychomycosis   . RA (rheumatoid arthritis) ([place])   . Reflux esophagitis   . Reflux esophagitis   . Sjogren's syndrome ([place])   . Tinea corporis   . Unspecified mood (affective) disorder ([place])   . Weight loss     Past Surgical History:  Procedure Laterality Date  . ADENOIDECTOMY    . CARPAL TUNNEL RELEASE Right   . CESAREAN SECTION    . CESAREAN SECTION    . CYSTOSCOPY WITH STENT PLACEMENT N/A 08/01/2019   Procedure: CYSTOSCOPY WITH URETHRAL DILATION, BILATERAL RETROGRADE PYELOGRAMS, TRANSURETHRAL RESECTION OF SMALL BLADDER TUMOR, INSERTION OF BILATERAL OPEN ENDED CATHETERS WITH BILATERAL INJECTION OF FIREFLY;  Surgeon: Irine Seal, MD;   Location: [facility];  Service: Urology;  Laterality: N/A;  . FLEXIBLE SIGMOIDOSCOPY N/A 08/01/2019   Procedure: FLEXIBLE SIGMOIDOSCOPY;  Surgeon: Ileana Roup, MD;  Location: [facility];  Service: General;  Laterality: N/A;  . wisdom teeth      There were no vitals filed for this visit.   Subjective Assessment - 09/10/19 1549    Subjective  Pt is s/p robotic assisted lower ant resection to address rectosigmoid diverticulitis.  Also found a small bladder tumor and had transurethral resection of small bladder tumor.  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  11/25/19 scheduled to f/u on bladder to see if any remaining cancer.  Pt feels weakness overall and one open wound continuing to heal on Lt abdomen.  Intemittent pain in abdomen.  Pt reports normal BMs since surgery.    Pertinent History  Pt lost 200lb prior to surgery over 2 years through diet/exercise    Limitations  Sitting;Lifting;House hold activities    How long can you sit comfortably?  1 hour    Patient Stated Goals  get stronger, camp, hike, maintain weight loss with good muscle strength    Currently in Pain?  Yes    Pain Score  2  Pain Location  Abdomen    Pain Orientation  Left;Lower    Pain Type  Surgical pain    Pain Onset  More than a month ago    Pain Frequency  Intermittent    Aggravating Factors   sitting, bending         [facility] PT Assessment - 09/10/19 0001      Assessment   Medical Diagnosis  K56.699 (ICD-10-CM) - Other intestinal obstruction unspecified as to partial versus complete obstruction    Referring Provider (PT)  Ileana Roup, MD    Onset Date/Surgical Date  08/01/19    Hand Dominance  Right    Next MD Visit  09/24/19    Prior Therapy  yes for lumbar      Precautions   Precautions  Other (comment)    Precaution Comments  no lifting over 5lb      Restrictions   Weight Bearing Restrictions  No      Balance Screen   Has the patient fallen in the past 6 months  No      [place] residence    Living Arrangements  Children;Parent    Type of [place]  Two level      Prior Function   Level of [place]  On disability    Leisure  garden, exercise, walking      Cognition   Overall Cognitive Status  Within Functional Limits for tasks assessed      Observation/Other Assessments   Observations  Pt with signif amount of loose abdominal skin from 200lb weight loss    Skin Integrity  low central horiz abdominal scar with approx 1cm open wound on Lt with signs of healing, some redness/erythema surrounding, all other lap scars and remaining central incision good healing, fair mobility of central scar      Posture/Postural Control   Posture/Postural Control  Postural limitations    Postural Limitations  Decreased lumbar lordosis    Posture Comments  Rt intrapelvic torsion present      ROM / Strength   AROM / PROM / Strength  AROM;Strength      AROM   Overall AROM Comments  trunk limited 20% all planes, Rt hip WNL, Lt hip flexion, abd, ER limited 30% and painful      Strength   Overall Strength Comments  pelvic floor 3/5 with initial difficulty with isolation from pelvic tilt and glut activation, trunk strength 3+/5, LEs 4/5 throughout      Flexibility   Soft Tissue Assessment /Muscle Length  yes    Piriformis  Lt limited 50% and painful       Palpation   Spinal mobility  limited L4-S1 segmental flexion, lack of ribcage bucket handle motion bil    SI assessment   limited glide bil, no pain    Palpation comment  Lt hip: piriformis, glut med, glut min      Special Tests    Special Tests  Hip Special Tests    Hip Special Tests   Saralyn Pilar (FABER) Test;Hip Scouring      Saralyn Pilar (FABER) Test   Findings  Positive    Side  Left      Hip Scouring   Findings  Positive    Side  Left      Bed Mobility   Bed Mobility  --   needed cueing for safer bed mobility  mechanics     Ambulation/Gait   Gait  Pattern  Decreased stance time - left;Decreased weight shift to left;Step-through pattern;Decreased step length - right    Stairs  Yes    Stairs Assistance  7: Independent      High Level Balance   High Level Balance Comments  fair balance Lt LE SLS, good Rt LE SLS                Objective measurements completed on examination: See above findings.    Pelvic Floor Special Questions - 09/10/19 0001    Prior Pelvic/Prostate Exam  Yes    Date of Last Pelvic/Prostate Exam  --   02/2019   Result Pelvic/Prostate Exam   normal    Are you Pregnant or attempting pregnancy?  No    Prior Pregnancies  Yes    Number of Pregnancies  3    Number of C-Sections  2   last one was twins   Number of Vaginal Deliveries  0    Currently Sexually Active  No    Urinary Leakage  No    Fecal incontinence  No    Fluid intake  admits needs to drink more water    Caffeine beverages  4     Falling out feeling (prolapse)  No    External Perineal Exam  deferred today    Pelvic Floor Internal Exam  deferred today    Strength  fair squeeze, definite lift               PT Education - 09/10/19 1728    Education Details  Access Code: [email or web address]    Person(s) Educated  Patient    Methods  Explanation;Demonstration;Handout    Comprehension  Verbalized understanding;Returned demonstration       PT Short Term Goals - 09/10/19 1728      PT SHORT TERM GOAL #1   Title  Pt will be ind with initial HEP including using care with bed mobility and household/car transfers for abdominal healing.    Time  4    Period  Weeks    Status  New    Target Date  11/05/19      PT SHORT TERM GOAL #2   Title  Pt will demo gentle isolated deep core canister contractions and be able to hold 10x10 sec.    Time  4    Period  Weeks    Status  New    Target Date  10/08/19      PT SHORT TERM GOAL #3   Title  Pt will report at least 20% reduction in abdominal and Lt hip pain occurrence.    Time  4    Period   Weeks    Status  New    Target Date  10/08/19        PT Long Term Goals - 09/10/19 1731      PT LONG TERM GOAL #1   Title  Pt will achieve at least 4+/5 strength for bil LEs and trunk to improve tolerance of daily tasks.    Baseline  -    Time  8    Period  Weeks    Status  New    Target Date  11/05/19      PT LONG TERM GOAL #2   Title  Pt will report at least 75% improvement in abdominal and Lt hip pain with gait, transfers, and daily tasks.    Baseline  -  Time  8    Period  Weeks    Status  New    Target Date  11/05/19      PT LONG TERM GOAL #3   Title  Pt will demo ability to squat and lift 10lb with proper breath control and core contractions to manage pressures and protect herself from injury for light gardening tasks.    Time  12    Period  Weeks    Status  New    Target Date  12/03/19      PT LONG TERM GOAL #4   Title  Pt will be able to perform seated and standing activities x 1 hour each with pain not to exceed 2/10.    Baseline  -    Time  12    Period  Weeks    Status  New    Target Date  12/03/19      PT LONG TERM GOAL #5   Title  Pt will be able to perform floor to stand transfer with use of single UE support with proper mechanics and core control to improve safety with daily activity.    Baseline  -    Time  12    Period  Weeks    Status  New    Target Date  12/03/19             Plan - 09/10/19 1712    Clinical Impression Statement  Pt is nearly 6 weeks s/p robotic assisted lower anterior resection with anastamosis to address rectosigmoid diverticulitis.  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  She reports much improved BMs post-operatively, having a soft, formed stool daily in the AM.  She has no bladder or bowel urgency or incontinence.  She has intermittent pain that is short lived throughout abdomen which can occur when at rest or with activity.   She has a small 1cm area of lower horiz incision that re-opened last week and is slowly closing.  PT noted some erythema surrounding this area today and advised Pt keep close eye on this.  Given Pt's good bowel control and habits, evaluation focused on global mobility and strength.  She has gait asymmetry with decreased stance time on Lt LE due to Lt hip pain.  She has limited trunk, ribcage and Lt hip mobilty.  Lumbar facet gapping is limited bil L4-S1.  Lt hip is painful with both A/ROM and P/ROM into flexion, abd, ER.  There is no pain with resistance testing of the hip.  Lt hip + FABER and Scour and relief with Lt hip distraction. She has no abdominal tenderness to light palpation.  Pelvic floor contraction was palpated in sidelying through clothing and was at least 3/5.  PT instructed Pt on bed mobility and gentle SL TrA and PF contractions for intial HEP.  Pt is doing stairs at home for ther ex and is eager to be active and get stronger.  She will benefit from skilled PT to optimize mobility and strength while promoting healing s/p surgery.    Personal Factors and Comorbidities  Comorbidity 1;Comorbidity 2;Profession    Comorbidities  HTN, Sjogren's Syndrome, hypothyroid, fibromyalgia. On disability since 2012    Examination-Activity Limitations  Bed Mobility;Transfers;Locomotion Level;Bend;Squat;Lift;Carry    Examination-Participation Restrictions  Cleaning;Community Activity;[facility]    Stability/Clinical Decision Making  Evolving/Moderate complexity    Clinical Decision Making  Moderate  Rehab Potential  Good    PT Frequency  2x / week   1-2x/week   PT Duration  8 weeks    PT Treatment/Interventions  ADLs/Self Care Home Management;Biofeedback;Cryotherapy;Electrical Stimulation;Moist Heat;Manual techniques;Neuromuscular re-education;Balance training;Patient/family education;Gait training;Functional mobility training;Therapeutic activities;Therapeutic exercise;Joint Manipulations;Spinal  Manipulations;Passive range of motion;Dry needling    PT Next Visit Plan  NuStep, check skin integrity abdominal incision, lumbar mobs, SI mobs, Lt hip STM and distraction, gentle body mechanics and trunk/LE strength    PT Home Exercise Plan  [id number]    Consulted and Agree with Plan of Care  Patient       Patient will benefit from skilled therapeutic intervention in order to improve the following deficits and impairments:  Abnormal gait, Decreased range of motion, Difficulty walking, Decreased endurance, Pain, Decreased skin integrity, Improper body mechanics, Postural dysfunction, Decreased strength, Decreased mobility, Decreased balance, Decreased scar mobility, Hypomobility, Impaired flexibility  Visit Diagnosis: Muscle weakness (generalized) - Plan: PT plan of care cert/re-cert  Pain in left hip - Plan: PT plan of care cert/re-cert     Problem List Patient Active Problem List   Diagnosis Date Noted  . Status post laparoscopic-assisted sigmoidectomy 08/01/2019    Baruch Merl, PT 09/10/19 5:40 PM   [facility] [address] [place], [place], [address] Phone: [phone]   Fax:  [phone]  Name: Jennifer Franco MRN: [id number] Date of Birth: 1955/05/31

## 2019-09-10 NOTE — Patient Instructions (Signed)
Access Code: [email or web address] URL: [email or web address]: 05/04/2021Prepared by: Venetia Night BeuhringExercises  Sidelying Transversus Abdominis Bracing - 1 x daily - 7 x weekly - 1 sets - 10 reps - 10 hold  Sidelying Pelvic Floor Contraction with Self-Palpation - 1 x daily - 7 x weekly - 1 sets - 10 reps - 10 hold  Sidelying Diaphragmatic Breathing - 1 x daily - 7 x weekly - 2 sets - 10 reps

## 2019-09-30 ENCOUNTER — Other Ambulatory Visit: Payer: Self-pay

## 2019-09-30 ENCOUNTER — Ambulatory Visit: Payer: BC Managed Care – PPO | Admitting: Physical Therapy

## 2019-09-30 ENCOUNTER — Encounter: Payer: Self-pay | Admitting: Physical Therapy

## 2019-09-30 DIAGNOSIS — M6281 Muscle weakness (generalized): Secondary | ICD-10-CM

## 2019-09-30 DIAGNOSIS — M25552 Pain in left hip: Secondary | ICD-10-CM

## 2019-09-30 NOTE — Therapy (Signed)
[facility] [address] [place], [place], [address] Phone: [phone]   Fax:  [phone]  Physical Therapy Treatment  Patient Details  Name: Jennifer Franco MRN: [id number] Date of Birth: 17-Jan-1956 Referring Provider (PT): Ileana Roup, MD   Encounter Date: 09/30/2019  PT End of Session - 09/30/19 1101    Visit Number  2    Date for PT Re-Evaluation  12/03/19    Authorization Type  Medicare    Progress Note Due on Visit  10    PT Start Time  1023   Pt late   PT Stop Time  1101    PT Time Calculation (min)  38 min    Activity Tolerance  Patient tolerated treatment well    Behavior During Therapy  [facility] for tasks assessed/performed       Past Medical History:  Diagnosis Date  . Anemia   . Anginal pain (HCC)    anxiety  . Anxiety   . Chills   . CTS (carpal tunnel syndrome)    LEFT  . Cyclical vomiting   . Depression   . Diarrhea, unspecified   . Diverticulitis   . Fibromyalgia   . Hemorrhoids   . History of adenomatous polyp of colon   . History of morbid obesity   . Hypothyroid   . Hypothyroidism   . Intractable nausea and vomiting   . Irregular bowel habits   . Joint pain   . LLQ abdominal pain   . Morbid obesity ([place])   . Nausea and vomiting   . Onychomycosis   . RA (rheumatoid arthritis) ([place])   . Reflux esophagitis   . Reflux esophagitis   . Sjogren's syndrome ([place])   . Tinea corporis   . Unspecified mood (affective) disorder ([place])   . Weight loss     Past Surgical History:  Procedure Laterality Date  . ADENOIDECTOMY    . CARPAL TUNNEL RELEASE Right   . CESAREAN SECTION    . CESAREAN SECTION    . CYSTOSCOPY WITH STENT PLACEMENT N/A 08/01/2019   Procedure: CYSTOSCOPY WITH URETHRAL DILATION, BILATERAL RETROGRADE PYELOGRAMS, TRANSURETHRAL RESECTION OF SMALL BLADDER TUMOR, INSERTION OF BILATERAL OPEN ENDED CATHETERS WITH BILATERAL INJECTION OF FIREFLY;  Surgeon: Irine Seal, MD;  Location: [facility];  Service: Urology;  Laterality: N/A;  . FLEXIBLE SIGMOIDOSCOPY N/A 08/01/2019   Procedure: FLEXIBLE SIGMOIDOSCOPY;  Surgeon: Ileana Roup, MD;  Location: [facility];  Service: General;  Laterality: N/A;  . wisdom teeth      There were no vitals filed for this visit.  Subjective Assessment - 09/30/19 1028    Subjective  Pt reports she has been very active and has noticed some limits.  Lt hip continues to bother her a bit.  I have been moving furniture, trash, pillows.  Incision is now closed and comfortable.  Fleeting pain in abdomen that doesn't last.    Pertinent History  Pt lost 200lb prior to surgery over 2 years through diet/exercise    Limitations  Sitting;Lifting;House hold activities    How long can you sit comfortably?  1 hour    Patient Stated Goals  get stronger, camp, hike, maintain weight loss with good muscle strength    Currently in Pain?  No/denies    Pain Onset  More than a month ago                        [facility] Adult  PT Treatment/Exercise - 09/30/19 0001      Exercises   Exercises  Lumbar;Knee/Hip      Lumbar Exercises: Aerobic   Nustep  L2 x 3', PT present to discuss symptoms/activity level      Lumbar Exercises: Sidelying   Other Sidelying Lumbar Exercises  TrA 5x10    Other Sidelying Lumbar Exercises  diaphragmatic breathing x 3'      Knee/Hip Exercises: Standing   Functional Squat  5 reps    Functional Squat Limitations  Lt hip pain, d/c'd    SLS  bil UE support 1x30 sec, PT cued TrA and hip abd closed chain      Knee/Hip Exercises: Supine   Other Supine Knee/Hip Exercises  Lt LE long axis hip IR/ER x 5      Manual Therapy   Manual Therapy  Passive ROM    Manual therapy comments  gentle hip isometrics on Lt via PT provided resistance flexion, abd, ER/IR    Passive ROM  Lt hip gentle ROM             PT Education - 09/30/19 1059    Education Details  Access Code: [email or web address]    Person(s) Educated   Patient    Methods  Explanation;Demonstration;Handout;Verbal cues    Comprehension  Verbalized understanding;Returned demonstration       PT Short Term Goals - 09/10/19 1728      PT SHORT TERM GOAL #1   Title  Pt will be ind with initial HEP including using care with bed mobility and household/car transfers for abdominal healing.    Time  4    Period  Weeks    Status  New    Target Date  11/05/19      PT SHORT TERM GOAL #2   Title  Pt will demo gentle isolated deep core canister contractions and be able to hold 10x10 sec.    Time  4    Period  Weeks    Status  New    Target Date  10/08/19      PT SHORT TERM GOAL #3   Title  Pt will report at least 20% reduction in abdominal and Lt hip pain occurrence.    Time  4    Period  Weeks    Status  New    Target Date  10/08/19        PT Long Term Goals - 09/10/19 1731      PT LONG TERM GOAL #1   Title  Pt will achieve at least 4+/5 strength for bil LEs and trunk to improve tolerance of daily tasks.    Baseline  -    Time  8    Period  Weeks    Status  New    Target Date  11/05/19      PT LONG TERM GOAL #2   Title  Pt will report at least 75% improvement in abdominal and Lt hip pain with gait, transfers, and daily tasks.    Baseline  -    Time  8    Period  Weeks    Status  New    Target Date  11/05/19      PT LONG TERM GOAL #3   Title  Pt will demo ability to squat and lift 10lb with proper breath control and core contractions to manage pressures and protect herself from injury for light gardening tasks.    Time  12    Period  Weeks  Status  New    Target Date  12/03/19      PT LONG TERM GOAL #4   Title  Pt will be able to perform seated and standing activities x 1 hour each with pain not to exceed 2/10.    Baseline  -    Time  12    Period  Weeks    Status  New    Target Date  12/03/19      PT LONG TERM GOAL #5   Title  Pt will be able to perform floor to stand transfer with use of single UE support with  proper mechanics and core control to improve safety with daily activity.    Baseline  -    Time  12    Period  Weeks    Status  New    Target Date  12/03/19            Plan - 09/30/19 1253    Clinical Impression Statement  Pt reports she has been very active.  She has some fleeting abdominal pain that is very brief.  She has ongoing Lt hip pain that is aggravated with P/ROM and A/ROM below 90 deg.  PT focused on TrA intro in SL, standing SLS with postural cueing and diaphragmatic breathing given abdominal wall tension and diaphragm splinting.  Pt anticipates she may have another surgery for skin management along abdomen in next 3 weeks. PT asked Pt to keep [place] posted on this as it will affect our POC.  Pt will continue to benefit from manual therapy, stabilization and body mechanics as she recovers post-op.    Comorbidities  HTN, Sjogren's Syndrome, hypothyroid, fibromyalgia. On disability since 2012    Rehab Potential  Good    PT Frequency  2x / week    PT Duration  8 weeks    PT Treatment/Interventions  ADLs/Self Care Home Management;Biofeedback;Cryotherapy;Electrical Stimulation;Moist Heat;Manual techniques;Neuromuscular re-education;Balance training;Patient/family education;Gait training;Functional mobility training;Therapeutic activities;Therapeutic exercise;Joint Manipulations;Spinal Manipulations;Passive range of motion;Dry needling    PT Next Visit Plan  Lt hip STM and distraction, lumbar mobs on Lt, f/u on TrA, breathing, gentle body mechanics    PT Home Exercise Plan  Access Code: [email or web address]    Consulted and Agree with Plan of Care  Patient       Patient will benefit from skilled therapeutic intervention in order to improve the following deficits and impairments:     Visit Diagnosis: Muscle weakness (generalized)  Pain in left hip     Problem List Patient Active Problem List   Diagnosis Date Noted  . Status post laparoscopic-assisted sigmoidectomy 08/01/2019     Baruch Merl, PT 09/30/19 12:58 PM   [facility] [address], [place], [place], [address] Phone: [phone]   Fax:  [phone]  Name: Jennifer Franco MRN: [id number] Date of Birth: 02/02/1956

## 2019-09-30 NOTE — Patient Instructions (Signed)
Access Code: [email or web address] URL: [email or web address]: 05/24/2021Prepared by: Venetia Night BeuhringExercises  Sidelying Diaphragmatic Breathing - 1 x daily - 7 x weekly - 3-5 min hold  Sidelying Transversus Abdominis Bracing - 1 x daily - 7 x weekly - 10 sets - 10 reps  Standing Single Leg Stance with Counter Support - 1 x daily - 7 x weekly - 1 sets - 3 reps - 20 hold

## 2019-10-10 ENCOUNTER — Other Ambulatory Visit: Payer: Self-pay

## 2019-10-10 ENCOUNTER — Encounter: Payer: Self-pay | Admitting: Physical Therapy

## 2019-10-10 ENCOUNTER — Ambulatory Visit: Payer: BC Managed Care – PPO | Attending: Surgery | Admitting: Physical Therapy

## 2019-10-10 DIAGNOSIS — M6281 Muscle weakness (generalized): Secondary | ICD-10-CM

## 2019-10-10 DIAGNOSIS — M25552 Pain in left hip: Secondary | ICD-10-CM | POA: Diagnosis present

## 2019-10-10 NOTE — Therapy (Signed)
[facility] [address] [place], [place], [address] Phone: [phone]   Fax:  [phone]  Physical Therapy Treatment  Patient Details  Name: Jennifer Franco MRN: [id number] Date of Birth: 1956/01/07 Referring Provider (PT): Ileana Roup, MD   Encounter Date: 10/10/2019  PT End of Session - 10/10/19 1623    Visit Number  3    Date for PT Re-Evaluation  12/03/19    Authorization Type  Medicare    Progress Note Due on Visit  10    PT Start Time  9518    PT Stop Time  1700    PT Time Calculation (min)  45 min    Activity Tolerance  Patient tolerated treatment well    Behavior During Therapy  [facility] for tasks assessed/performed       Past Medical History:  Diagnosis Date  . Anemia   . Anginal pain (HCC)    anxiety  . Anxiety   . Chills   . CTS (carpal tunnel syndrome)    LEFT  . Cyclical vomiting   . Depression   . Diarrhea, unspecified   . Diverticulitis   . Fibromyalgia   . Hemorrhoids   . History of adenomatous polyp of colon   . History of morbid obesity   . Hypothyroid   . Hypothyroidism   . Intractable nausea and vomiting   . Irregular bowel habits   . Joint pain   . LLQ abdominal pain   . Morbid obesity ([place])   . Nausea and vomiting   . Onychomycosis   . RA (rheumatoid arthritis) ([place])   . Reflux esophagitis   . Reflux esophagitis   . Sjogren's syndrome ([place])   . Tinea corporis   . Unspecified mood (affective) disorder ([place])   . Weight loss     Past Surgical History:  Procedure Laterality Date  . ADENOIDECTOMY    . CARPAL TUNNEL RELEASE Right   . CESAREAN SECTION    . CESAREAN SECTION    . CYSTOSCOPY WITH STENT PLACEMENT N/A 08/01/2019   Procedure: CYSTOSCOPY WITH URETHRAL DILATION, BILATERAL RETROGRADE PYELOGRAMS, TRANSURETHRAL RESECTION OF SMALL BLADDER TUMOR, INSERTION OF BILATERAL OPEN ENDED CATHETERS WITH BILATERAL INJECTION OF FIREFLY;  Surgeon: Irine Seal, MD;   Location: [facility];  Service: Urology;  Laterality: N/A;  . FLEXIBLE SIGMOIDOSCOPY N/A 08/01/2019   Procedure: FLEXIBLE SIGMOIDOSCOPY;  Surgeon: Ileana Roup, MD;  Location: [facility];  Service: General;  Laterality: N/A;  . wisdom teeth      There were no vitals filed for this visit.  Subjective Assessment - 10/10/19 1617    Subjective  My left hip is bothering me more today - not sure if it's the weather.  I will be scheduled for the abdominal surgery in the next 2-3 weeks.    Pertinent History  Pt lost 200lb prior to surgery over 2 years through diet/exercise    Limitations  Sitting;Lifting;House hold activities    How long can you sit comfortably?  1 hour    Patient Stated Goals  get stronger, camp, hike, maintain weight loss with good muscle strength    Currently in Pain?  Yes    Pain Score  3     Pain Location  Hip    Pain Orientation  Left    Pain Descriptors / Indicators  Sore    Pain Type  Chronic pain    Pain Onset  More than a month ago    Pain  Frequency  Intermittent    Aggravating Factors   stairs leading with Lt                        [facility] Adult PT Treatment/Exercise - 10/10/19 0001      Bed Mobility   Bed Mobility  Rolling Right;Rolling Left;Supine to Sit;Sit to Supine   PT worked on [place] from sit to SL to supine and reverse     Neuro Re-ed    Neuro Re-ed Details   review of TrA with bed mobility      Lumbar Exercises: Aerobic   Nustep  L2 x 6' PT present to review status      Manual Therapy   Manual Therapy  Passive ROM;Joint mobilization;Soft tissue mobilization;Manual Traction    Manual therapy comments  elbow to Lt piriformis in SL with contract/relax small range clam    Joint Mobilization  posterior and distraction mobs for Lt hip Gr II/III    Soft tissue mobilization  Lt proximal adductors, Lt piriformis and glut med deep pressure release and strumming    Passive ROM  Lt hip flex/abd diagonal, Lt hip flexion to 90 deg with STM     Manual Traction  Lt LE single leg traction supine Gr II in slight hip flexion               PT Short Term Goals - 10/10/19 1623      PT SHORT TERM GOAL #1   Title  Pt will be ind with initial HEP including using care with bed mobility and household/car transfers for abdominal healing.    Status  On-going      PT SHORT TERM GOAL #2   Title  Pt will demo gentle isolated deep core canister contractions and be able to hold 10x10 sec.    Status  On-going      PT SHORT TERM GOAL #3   Title  Pt will report at least 20% reduction in abdominal and Lt hip pain occurrence.    Baseline  met for abdominals, not for Lt hip    Status  On-going        PT Long Term Goals - 09/10/19 1731      PT LONG TERM GOAL #1   Title  Pt will achieve at least 4+/5 strength for bil LEs and trunk to improve tolerance of daily tasks.    Baseline  -    Time  8    Period  Weeks    Status  New    Target Date  11/05/19      PT LONG TERM GOAL #2   Title  Pt will report at least 75% improvement in abdominal and Lt hip pain with gait, transfers, and daily tasks.    Baseline  -    Time  8    Period  Weeks    Status  New    Target Date  11/05/19      PT LONG TERM GOAL #3   Title  Pt will demo ability to squat and lift 10lb with proper breath control and core contractions to manage pressures and protect herself from injury for light gardening tasks.    Time  12    Period  Weeks    Status  New    Target Date  12/03/19      PT LONG TERM GOAL #4   Title  Pt will be able to perform seated and standing activities x  1 hour each with pain not to exceed 2/10.    Baseline  -    Time  12    Period  Weeks    Status  New    Target Date  12/03/19      PT LONG TERM GOAL #5   Title  Pt will be able to perform floor to stand transfer with use of single UE support with proper mechanics and core control to improve safety with daily activity.    Baseline  -    Time  12    Period  Weeks    Status  New     Target Date  12/03/19            Plan - 10/10/19 1703    Clinical Impression Statement  Pt anticipates abdominal surgery for skin management in next 2-3 weeks.  Pt continues to have normal bowel and bladder patterns.  Minimal abdominal pain.  Main complaint is chronic Lt hip pain.  Pt with pain with both active and passive motion of Lt hip above 90 degrees flexion and beyond 20 deg ER.  PT performed manual techniques to address signif tension in proximal adductors, piriformis, and glut med as well as joint mobs/distraction for Lt hip.  Improved release of muscle guarding allowing for painfree flexion to 90 and ER to 35 deg end of session . Gait reveals poor mobility of Lt hemipelvis so PT plans to address sacral mobilization next visit.  PT reviewed sequencing for bed mobility with TrA today for transfers. Continue along POC with d/c before abdominal surgery.    Comorbidities  HTN, Sjogren's Syndrome, hypothyroid, fibromyalgia. On disability since 2012    PT Frequency  2x / week    PT Duration  8 weeks    PT Treatment/Interventions  ADLs/Self Care Home Management;Biofeedback;Cryotherapy;Electrical Stimulation;Moist Heat;Manual techniques;Neuromuscular re-education;Balance training;Patient/family education;Gait training;Functional mobility training;Therapeutic activities;Therapeutic exercise;Joint Manipulations;Spinal Manipulations;Passive range of motion;Dry needling    PT Next Visit Plan  recheck Lt hip soft tissues and joint capsule, perform sacral mobs and pelvic PNF on Lt    PT Home Exercise Plan  Access Code: [email or web address]    Consulted and Agree with Plan of Care  Patient       Patient will benefit from skilled therapeutic intervention in order to improve the following deficits and impairments:     Visit Diagnosis: Muscle weakness (generalized)  Pain in left hip     Problem List Patient Active Problem List   Diagnosis Date Noted  . Status post laparoscopic-assisted sigmoidectomy  08/01/2019    Baruch Merl, PT 10/10/19 5:09 PM   [facility] [address] [place], [place], [address] Phone: [phone]   Fax:  [phone]  Name: Jennifer Franco MRN: [id number] Date of Birth: 08-Mar-1956

## 2019-10-15 ENCOUNTER — Ambulatory Visit: Payer: BC Managed Care – PPO | Admitting: Physical Therapy

## 2019-10-15 ENCOUNTER — Encounter: Payer: Self-pay | Admitting: Physical Therapy

## 2019-10-15 ENCOUNTER — Other Ambulatory Visit: Payer: Self-pay

## 2019-10-15 DIAGNOSIS — M6281 Muscle weakness (generalized): Secondary | ICD-10-CM

## 2019-10-15 DIAGNOSIS — M25552 Pain in left hip: Secondary | ICD-10-CM

## 2019-10-15 NOTE — Patient Instructions (Signed)
Access Code: [email or web address] URL: [email or web address]: 06/08/2021Prepared by: Venetia Night BeuhringExercises  Sidelying Diaphragmatic Breathing - 1 x daily - 7 x weekly - 3-5 min hold  Sidelying Transversus Abdominis Bracing - 1 x daily - 7 x weekly - 10 sets - 10 reps  Standing Single Leg Stance with Counter Support - 1 x daily - 7 x weekly - 1 sets - 3 reps - 20 hold  Quadriceps Stretch with Chair - 1 x daily - 7 x weekly - 1 sets - 2 reps - 60 hold  Hip Flexor Stretch with Chair - 1 x daily - 7 x weekly - 1 sets - 2 reps - 30 hold

## 2019-10-15 NOTE — Therapy (Signed)
[facility] [address], [place], [place], [address] Phone: [phone]   Fax:  [phone]  Physical Therapy Treatment  Patient Details  Name: Jennifer Franco MRN: [id number] Date of Birth: 06-Jul-1955 Referring Provider (PT): Ileana Roup, MD   Encounter Date: 10/15/2019  PT End of Session - 10/15/19 1143    Visit Number  4    Date for PT Re-Evaluation  12/03/19    Authorization Type  Medicare    Progress Note Due on Visit  10    PT Start Time  1100    PT Stop Time  1143    PT Time Calculation (min)  43 min       Past Medical History:  Diagnosis Date  . Anemia   . Anginal pain (HCC)    anxiety  . Anxiety   . Chills   . CTS (carpal tunnel syndrome)    LEFT  . Cyclical vomiting   . Depression   . Diarrhea, unspecified   . Diverticulitis   . Fibromyalgia   . Hemorrhoids   . History of adenomatous polyp of colon   . History of morbid obesity   . Hypothyroid   . Hypothyroidism   . Intractable nausea and vomiting   . Irregular bowel habits   . Joint pain   . LLQ abdominal pain   . Morbid obesity ([place])   . Nausea and vomiting   . Onychomycosis   . RA (rheumatoid arthritis) ([place])   . Reflux esophagitis   . Reflux esophagitis   . Sjogren's syndrome ([place])   . Tinea corporis   . Unspecified mood (affective) disorder ([place])   . Weight loss     Past Surgical History:  Procedure Laterality Date  . ADENOIDECTOMY    . CARPAL TUNNEL RELEASE Right   . CESAREAN SECTION    . CESAREAN SECTION    . CYSTOSCOPY WITH STENT PLACEMENT N/A 08/01/2019   Procedure: CYSTOSCOPY WITH URETHRAL DILATION, BILATERAL RETROGRADE PYELOGRAMS, TRANSURETHRAL RESECTION OF SMALL BLADDER TUMOR, INSERTION OF BILATERAL OPEN ENDED CATHETERS WITH BILATERAL INJECTION OF FIREFLY;  Surgeon: Irine Seal, MD;  Location: [facility];  Service: Urology;  Laterality: N/A;  . FLEXIBLE SIGMOIDOSCOPY N/A 08/01/2019   Procedure: FLEXIBLE  SIGMOIDOSCOPY;  Surgeon: Ileana Roup, MD;  Location: [facility];  Service: General;  Laterality: N/A;  . wisdom teeth      There were no vitals filed for this visit.  Subjective Assessment - 10/15/19 1105    Subjective  I am concentrating on how I'm walking.  I do feel better since last visit.    Pertinent History  Pt lost 200lb prior to surgery over 2 years through diet/exercise    Limitations  Sitting;Lifting;House hold activities    How long can you sit comfortably?  1 hour    Patient Stated Goals  get stronger, camp, hike, maintain weight loss with good muscle strength    Currently in Pain?  Yes    Pain Score  3     Pain Location  Hip    Pain Orientation  Left;Anterior;Lateral    Pain Descriptors / Indicators  Sore    Pain Type  Chronic pain    Pain Onset  More than a month ago    Pain Frequency  Intermittent    Aggravating Factors   stairs, walking, bringing knee higher than my hip  [place] Adult PT Treatment/Exercise - 10/15/19 0001      Neuro Re-ed    Neuro Re-ed Details   Lt hemipelvis PNF: passive, AA/ROM in Rt SL      Lumbar Exercises: Aerobic   Nustep  L1 x 3', d/c'd due to Lt hip pain      Knee/Hip Exercises: Stretches   Control and instrumentation engineer reps;30 seconds    Quad Stretch Limitations  standing    Hip Flexor Stretch  Left;2 reps;30 seconds    Hip Flexor Stretch Limitations  foot on 2nd step      Manual Therapy   Manual Therapy  Joint mobilization;Passive ROM;Soft tissue mobilization    Joint Mobilization  posterior glide supine with hip and knee at 90/90 Gr II/III    Soft tissue mobilization  Lt quad, proximal adductors, glut med    Passive ROM  Lt LE flex/abd combo, flexion with slight ER during joint mobs             PT Education - 10/15/19 1141    Education Details  Access Code: [email or web address]    Person(s) Educated  Patient    Methods  Explanation;Demonstration;Handout;Verbal cues    Comprehension  Verbalized  understanding;Returned demonstration       PT Short Term Goals - 10/10/19 1623      PT SHORT TERM GOAL #1   Title  Pt will be ind with initial HEP including using care with bed mobility and household/car transfers for abdominal healing.    Status  On-going      PT SHORT TERM GOAL #2   Title  Pt will demo gentle isolated deep core canister contractions and be able to hold 10x10 sec.    Status  On-going      PT SHORT TERM GOAL #3   Title  Pt will report at least 20% reduction in abdominal and Lt hip pain occurrence.    Baseline  met for abdominals, not for Lt hip    Status  On-going        PT Long Term Goals - 09/10/19 1731      PT LONG TERM GOAL #1   Title  Pt will achieve at least 4+/5 strength for bil LEs and trunk to improve tolerance of daily tasks.    Baseline  -    Time  8    Period  Weeks    Status  New    Target Date  11/05/19      PT LONG TERM GOAL #2   Title  Pt will report at least 75% improvement in abdominal and Lt hip pain with gait, transfers, and daily tasks.    Baseline  -    Time  8    Period  Weeks    Status  New    Target Date  11/05/19      PT LONG TERM GOAL #3   Title  Pt will demo ability to squat and lift 10lb with proper breath control and core contractions to manage pressures and protect herself from injury for light gardening tasks.    Time  12    Period  Weeks    Status  New    Target Date  12/03/19      PT LONG TERM GOAL #4   Title  Pt will be able to perform seated and standing activities x 1 hour each with pain not to exceed 2/10.    Baseline  -    Time  12  Period  Weeks    Status  New    Target Date  12/03/19      PT LONG TERM GOAL #5   Title  Pt will be able to perform floor to stand transfer with use of single UE support with proper mechanics and core control to improve safety with daily activity.    Baseline  -    Time  12    Period  Weeks    Status  New    Target Date  12/03/19            Plan - 10/15/19 1144     Clinical Impression Statement  Pt reports reduced frequency of Lt hip pain although when it occurs it can still be intense.  PT focused on pelvic PNF on Lt hemipelvis with transition of this motion to gait end of session.  Pt was able to tol P/ROM of Lt hip to 95 deg with slight ER during posterior glide mobs by PT.  She tends to grab/protect hip in anticipation of pain with hip flexors which exacerbates pain.  PT gave new hip flexor and quad stretches for HEP.  Continue along POC for reduced pain and improved strength and mobility of Lt hip/hemipelvis.    Comorbidities  HTN, Sjogren's Syndrome, hypothyroid, fibromyalgia. On disability since 2012    PT Frequency  2x / week    PT Duration  8 weeks    PT Treatment/Interventions  ADLs/Self Care Home Management;Biofeedback;Cryotherapy;Electrical Stimulation;Moist Heat;Manual techniques;Neuromuscular re-education;Balance training;Patient/family education;Gait training;Functional mobility training;Therapeutic activities;Therapeutic exercise;Joint Manipulations;Spinal Manipulations;Passive range of motion;Dry needling    PT Next Visit Plan  continue pelvic PNF Lt, f/u on standing hip flexor and quad stretch Lt, try passive hip flex/quad stretch on Lt hip in Rt SL, initiate bridges and sit to stand from elevated mat table    PT Home Exercise Plan  Access Code: [email or web address]    Consulted and Agree with Plan of Care  Patient       Patient will benefit from skilled therapeutic intervention in order to improve the following deficits and impairments:     Visit Diagnosis: Muscle weakness (generalized)  Pain in left hip     Problem List Patient Active Problem List   Diagnosis Date Noted  . Status post laparoscopic-assisted sigmoidectomy 08/01/2019    Baruch Merl, PT 10/15/19 11:48 AM   [facility] [address] [place], [place], [address] Phone: [phone]   Fax:   [phone]  Name: KALYNA PAOLELLA MRN: [id number] Date of Birth: 1955/05/12

## 2019-10-22 ENCOUNTER — Other Ambulatory Visit: Payer: Self-pay

## 2019-10-22 ENCOUNTER — Encounter: Payer: Self-pay | Admitting: Physical Therapy

## 2019-10-22 ENCOUNTER — Ambulatory Visit: Payer: BC Managed Care – PPO | Admitting: Physical Therapy

## 2019-10-22 DIAGNOSIS — M25552 Pain in left hip: Secondary | ICD-10-CM

## 2019-10-22 DIAGNOSIS — M6281 Muscle weakness (generalized): Secondary | ICD-10-CM | POA: Diagnosis not present

## 2019-10-22 NOTE — Therapy (Signed)
[facility] [address] [place], [place], [address] Phone: [phone]   Fax:  [phone]  Physical Therapy Treatment  Patient Details  Name: Jennifer Franco MRN: [id number] Date of Birth: 07-Feb-1956 Referring Provider (PT): Ileana Roup, MD   Encounter Date: 10/22/2019   PT End of Session - 10/22/19 1142    Visit Number 5    Date for PT Re-Evaluation 12/03/19    Authorization Type Medicare    Progress Note Due on Visit 10    PT Start Time 1103    PT Stop Time 1151    PT Time Calculation (min) 48 min    Activity Tolerance Patient tolerated treatment well    Behavior During Therapy [facility] for tasks assessed/performed           Past Medical History:  Diagnosis Date  . Anemia   . Anginal pain (HCC)    anxiety  . Anxiety   . Chills   . CTS (carpal tunnel syndrome)    LEFT  . Cyclical vomiting   . Depression   . Diarrhea, unspecified   . Diverticulitis   . Fibromyalgia   . Hemorrhoids   . History of adenomatous polyp of colon   . History of morbid obesity   . Hypothyroid   . Hypothyroidism   . Intractable nausea and vomiting   . Irregular bowel habits   . Joint pain   . LLQ abdominal pain   . Morbid obesity ([place])   . Nausea and vomiting   . Onychomycosis   . RA (rheumatoid arthritis) ([place])   . Reflux esophagitis   . Reflux esophagitis   . Sjogren's syndrome ([place])   . Tinea corporis   . Unspecified mood (affective) disorder ([place])   . Weight loss     Past Surgical History:  Procedure Laterality Date  . ADENOIDECTOMY    . CARPAL TUNNEL RELEASE Right   . CESAREAN SECTION    . CESAREAN SECTION    . CYSTOSCOPY WITH STENT PLACEMENT N/A 08/01/2019   Procedure: CYSTOSCOPY WITH URETHRAL DILATION, BILATERAL RETROGRADE PYELOGRAMS, TRANSURETHRAL RESECTION OF SMALL BLADDER TUMOR, INSERTION OF BILATERAL OPEN ENDED CATHETERS WITH BILATERAL INJECTION OF FIREFLY;  Surgeon: Irine Seal, MD;   Location: [facility];  Service: Urology;  Laterality: N/A;  . FLEXIBLE SIGMOIDOSCOPY N/A 08/01/2019   Procedure: FLEXIBLE SIGMOIDOSCOPY;  Surgeon: Ileana Roup, MD;  Location: [facility];  Service: General;  Laterality: N/A;  . wisdom teeth      There were no vitals filed for this visit.   Subjective Assessment - 10/22/19 1106    Subjective I overdid around the house yesterday, lots of bending, stairs, lifting.  I am cleaning out my mom's house.  I was more careful but could hardly walk last night.  I am not limping today.    Pertinent History Pt lost 200lb prior to surgery over 2 years through diet/exercise    Limitations Sitting;Lifting;House hold activities    How long can you sit comfortably? 1 hour    Patient Stated Goals get stronger, camp, hike, maintain weight loss with good muscle strength    Currently in Pain? Yes    Pain Score 1     Pain Location Hip    Pain Orientation Left;Anterior;Lateral    Pain Descriptors / Indicators Sore;Tightness    Pain Type Chronic pain    Pain Onset More than a month ago    Pain Frequency Intermittent    Aggravating Factors  stairs but improving, walking but improving                             [facility] Adult PT Treatment/Exercise - 10/22/19 0001      Modalities   Modalities Moist Heat      Moist Heat Therapy   Number Minutes Moist Heat 10 Minutes    Moist Heat Location Hip   Lt     Manual Therapy   Manual Therapy Soft tissue mobilization;Manual Traction    Joint Mobilization posterior glide supine with hip and knee at 90/90 Gr II/III    Soft tissue mobilization Lt piriformis, Glut med    Passive ROM Lt IR/ER supine long leg position    Manual Traction Lt LE supine long axis Gr II/III 2x30 sec             Trigger Point Dry Needling - 10/22/19 0001    Consent Given? Yes    Education Handout Provided Previously provided    Muscles Treated Back/Hip Gluteus medius;Piriformis    Other Dry Needling Left    Gluteus  Medius Response Twitch response elicited;Palpable increased muscle length    Piriformis Response Twitch response elicited;Palpable increased muscle length                  PT Short Term Goals - 10/10/19 1623      PT SHORT TERM GOAL #1   Title Pt will be ind with initial HEP including using care with bed mobility and household/car transfers for abdominal healing.    Status On-going      PT SHORT TERM GOAL #2   Title Pt will demo gentle isolated deep core canister contractions and be able to hold 10x10 sec.    Status On-going      PT SHORT TERM GOAL #3   Title Pt will report at least 20% reduction in abdominal and Lt hip pain occurrence.    Baseline met for abdominals, not for Lt hip    Status On-going             PT Long Term Goals - 09/10/19 1731      PT LONG TERM GOAL #1   Title Pt will achieve at least 4+/5 strength for bil LEs and trunk to improve tolerance of daily tasks.    Baseline -    Time 8    Period Weeks    Status New    Target Date 11/05/19      PT LONG TERM GOAL #2   Title Pt will report at least 75% improvement in abdominal and Lt hip pain with gait, transfers, and daily tasks.    Baseline -    Time 8    Period Weeks    Status New    Target Date 11/05/19      PT LONG TERM GOAL #3   Title Pt will demo ability to squat and lift 10lb with proper breath control and core contractions to manage pressures and protect herself from injury for light gardening tasks.    Time 12    Period Weeks    Status New    Target Date 12/03/19      PT LONG TERM GOAL #4   Title Pt will be able to perform seated and standing activities x 1 hour each with pain not to exceed 2/10.    Baseline -    Time 12    Period Weeks  Status New    Target Date 12/03/19      PT LONG TERM GOAL #5   Title Pt will be able to perform floor to stand transfer with use of single UE support with proper mechanics and core control to improve safety with daily activity.    Baseline -      Time 12    Period Weeks    Status New    Target Date 12/03/19                 Plan - 10/22/19 1143    Clinical Impression Statement Pt reports she is tolerating more walking, bending, lifting and better stair mechanics with Lt LE since starting PT.  She overdid it yesterday and "could barely walk" last night, although was a 1/10 this morning.  She presented with signif TPs in Lt lateral hip so PT performed DN to glut med and piriformis with good release.  Pt continues to have pain with flexion/abd past 90 deg.  PT may perform DN to proximal adductors next visit.  Continue along POC and as hip pain decreases, focus on more trunk and LE strength and body mechanics for bending/lfiting.    Comorbidities HTN, Sjogren's Syndrome, hypothyroid, fibromyalgia. On disability since 2012    PT Frequency 2x / week    PT Duration 8 weeks    PT Treatment/Interventions ADLs/Self Care Home Management;Biofeedback;Cryotherapy;Electrical Stimulation;Moist Heat;Manual techniques;Neuromuscular re-education;Balance training;Patient/family education;Gait training;Functional mobility training;Therapeutic activities;Therapeutic exercise;Joint Manipulations;Spinal Manipulations;Passive range of motion;Dry needling    PT Next Visit Plan f/u on DN to Lt hip, initiate bridges, sit to stand from elevated table, Lt hip mobs/STM, DN adductors    PT Home Exercise Plan Access Code: [email or web address]    Consulted and Agree with Plan of Care Patient           Patient will benefit from skilled therapeutic intervention in order to improve the following deficits and impairments:     Visit Diagnosis: Muscle weakness (generalized)  Pain in left hip     Problem List Patient Active Problem List   Diagnosis Date Noted  . Status post laparoscopic-assisted sigmoidectomy 08/01/2019    Baruch Merl, PT 10/22/19 11:47 AM   [facility] [address], [place], [place], [address] Phone: [phone]   Fax:  [phone]  Name: Jennifer Franco MRN: [id number] Date of Birth: [birth year]

## 2019-10-29 ENCOUNTER — Other Ambulatory Visit: Payer: Self-pay

## 2019-10-29 ENCOUNTER — Encounter: Payer: Self-pay | Admitting: Physical Therapy

## 2019-10-29 ENCOUNTER — Ambulatory Visit: Payer: BC Managed Care – PPO | Admitting: Physical Therapy

## 2019-10-29 DIAGNOSIS — M6281 Muscle weakness (generalized): Secondary | ICD-10-CM

## 2019-10-29 DIAGNOSIS — M25552 Pain in left hip: Secondary | ICD-10-CM

## 2019-10-29 NOTE — Therapy (Signed)
[facility] [address], [place], [place], [address] Phone: [phone]   Fax:  [phone]  Physical Therapy Treatment  Patient Details  Name: Jennifer Franco MRN: [id number] Date of Birth: [birth year] Referring Provider (PT): Ileana Roup, MD   Encounter Date: 10/29/2019   PT End of Session - 10/29/19 1103    Visit Number 6    Date for PT Re-Evaluation 12/03/19    Authorization Type Medicare    Progress Note Due on Visit 10    PT Start Time 1103    PT Stop Time 1145    PT Time Calculation (min) 42 min    Activity Tolerance Patient tolerated treatment well    Behavior During Therapy [facility] for tasks assessed/performed           Past Medical History:  Diagnosis Date  . Anemia   . Anginal pain (HCC)    anxiety  . Anxiety   . Chills   . CTS (carpal tunnel syndrome)    LEFT  . Cyclical vomiting   . Depression   . Diarrhea, unspecified   . Diverticulitis   . Fibromyalgia   . Hemorrhoids   . History of adenomatous polyp of colon   . History of morbid obesity   . Hypothyroid   . Hypothyroidism   . Intractable nausea and vomiting   . Irregular bowel habits   . Joint pain   . LLQ abdominal pain   . Morbid obesity ([place])   . Nausea and vomiting   . Onychomycosis   . RA (rheumatoid arthritis) ([place])   . Reflux esophagitis   . Reflux esophagitis   . Sjogren's syndrome ([place])   . Tinea corporis   . Unspecified mood (affective) disorder ([place])   . Weight loss     Past Surgical History:  Procedure Laterality Date  . ADENOIDECTOMY    . CARPAL TUNNEL RELEASE Right   . CESAREAN SECTION    . CESAREAN SECTION    . CYSTOSCOPY WITH STENT PLACEMENT N/A 08/01/2019   Procedure: CYSTOSCOPY WITH URETHRAL DILATION, BILATERAL RETROGRADE PYELOGRAMS, TRANSURETHRAL RESECTION OF SMALL BLADDER TUMOR, INSERTION OF BILATERAL OPEN ENDED CATHETERS WITH BILATERAL INJECTION OF FIREFLY;  Surgeon: Irine Seal, MD;   [facility]: [facility];  Service: Urology;  Laterality: N/A;  . FLEXIBLE SIGMOIDOSCOPY N/A 08/01/2019   Procedure: FLEXIBLE SIGMOIDOSCOPY;  Surgeon: Ileana Roup, MD;  Location: [facility];  Service: General;  Laterality: N/A;  . wisdom teeth      There were no vitals filed for this visit.   Subjective Assessment - 10/29/19 1106    Subjective I am definitely better.  I can do stairs with a more normal pattern since the DN last time.    Pertinent History Pt lost 200lb prior to surgery over 2 years through diet/exercise    Limitations Sitting;Lifting;House hold activities    How long can you sit comfortably? 1 hour    Patient Stated Goals get stronger, camp, hike, maintain weight loss with good muscle strength    Currently in Pain? Yes    Pain Score 1     Aggravating Factors  with hip flexion activities                             [facility] Adult PT Treatment/Exercise - 10/29/19 0001      Lumbar Exercises: Aerobic   UBE (Upper Arm Bike) L2.0 x 4' alt fwd/bwd each min,  standing in stagger stance, PT present to cue posture and monitor      Lumbar Exercises: Seated   Other Seated Lumbar Exercises vertical foam roller press 5x10 sec with count aloud to ensure breathing      Knee/Hip Exercises: Seated   Sit to Sand 10 reps   holding 7lb from elevated table, PT cued equal wt bil           Trigger Point Dry Needling - 10/29/19 0001    Consent Given? Yes    Education Handout Provided Previously provided    Muscles Treated Back/Hip Gluteus minimus;Gluteus medius;Piriformis    Other Dry Needling Lt    Gluteus Minimus Response Twitch response elicited;Palpable increased muscle length    Gluteus Medius Response Twitch response elicited;Palpable increased muscle length    Piriformis Response Twitch response elicited;Palpable increased muscle length                  PT Short Term Goals - 10/10/19 1623      PT SHORT TERM GOAL #1   Title Pt will be ind with initial  HEP including using care with bed mobility and household/car transfers for abdominal healing.    Status On-going      PT SHORT TERM GOAL #2   Title Pt will demo gentle isolated deep core canister contractions and be able to hold 10x10 sec.    Status On-going      PT SHORT TERM GOAL #3   Title Pt will report at least 20% reduction in abdominal and Lt hip pain occurrence.    Baseline met for abdominals, not for Lt hip    Status On-going             PT Long Term Goals - 09/10/19 1731      PT LONG TERM GOAL #1   Title Pt will achieve at least 4+/5 strength for bil LEs and trunk to improve tolerance of daily tasks.    Baseline -    Time 8    Period Weeks    Status New    Target Date 11/05/19      PT LONG TERM GOAL #2   Title Pt will report at least 75% improvement in abdominal and Lt hip pain with gait, transfers, and daily tasks.    Baseline -    Time 8    Period Weeks    Status New    Target Date 11/05/19      PT LONG TERM GOAL #3   Title Pt will demo ability to squat and lift 10lb with proper breath control and core contractions to manage pressures and protect herself from injury for light gardening tasks.    Time 12    Period Weeks    Status New    Target Date 12/03/19      PT LONG TERM GOAL #4   Title Pt will be able to perform seated and standing activities x 1 hour each with pain not to exceed 2/10.    Baseline -    Time 12    Period Weeks    Status New    Target Date 12/03/19      PT LONG TERM GOAL #5   Title Pt will be able to perform floor to stand transfer with use of single UE support with proper mechanics and core control to improve safety with daily activity.    Baseline -    Time 12    Period Weeks    Status  New    Target Date 12/03/19                 Plan - 10/29/19 1122    Clinical Impression Statement PT began more trunk and functional strength today with Pt with good tolerance.  She needed VCs to use bil LEs equally during sit to  stands.  She notices Lt hip pain with squat so PT had Pt work from elevated mat table.  She is able to recruit deep abdominals and manage breath but needs cues for prolonged use of core during longer challenges such as UBE.  Pt with good relief and improved stair management since last visit DN so Pt repeated DN today due to return of trigger points/tension since last visit.  Continue along POC.    Comorbidities HTN, Sjogren's Syndrome, hypothyroid, fibromyalgia. On disability since 2012    Rehab Potential Good    PT Frequency 2x / week    PT Duration 8 weeks    PT Treatment/Interventions ADLs/Self Care Home Management;Biofeedback;Cryotherapy;Electrical Stimulation;Moist Heat;Manual techniques;Neuromuscular re-education;Balance training;Patient/family education;Gait training;Functional mobility training;Therapeutic activities;Therapeutic exercise;Joint Manipulations;Spinal Manipulations;Passive range of motion;Dry needling    PT Next Visit Plan continue LE and trunk stabilization/strength, Lt hip DN/stretching/mobs/ROM    PT Home Exercise Plan Access Code: [email or web address]    Consulted and Agree with Plan of Care Patient           Patient will benefit from skilled therapeutic intervention in order to improve the following deficits and impairments:     Visit Diagnosis: Muscle weakness (generalized)  Pain in left hip     Problem List Patient Active Problem List   Diagnosis Date Noted  . Status post laparoscopic-assisted sigmoidectomy 08/01/2019    Baruch Merl, PT 10/29/19 11:46 AM   [facility] [address], [place], [place], [address] Phone: [phone]   Fax:  [phone]  Name: Jennifer Franco MRN: [id number] Date of Birth: 07/12/1955

## 2019-11-05 ENCOUNTER — Other Ambulatory Visit: Payer: Self-pay

## 2019-11-05 ENCOUNTER — Ambulatory Visit: Payer: BC Managed Care – PPO | Admitting: Physical Therapy

## 2019-11-05 ENCOUNTER — Encounter: Payer: Self-pay | Admitting: Physical Therapy

## 2019-11-05 DIAGNOSIS — M25552 Pain in left hip: Secondary | ICD-10-CM

## 2019-11-05 DIAGNOSIS — M6281 Muscle weakness (generalized): Secondary | ICD-10-CM

## 2019-11-05 NOTE — Therapy (Signed)
[facility] [address] [place], [place], [address] Phone: [phone]   Fax:  [phone]  Physical Therapy Treatment  Patient Details  Name: Jennifer Franco MRN: [id number] Date of Birth: 05-30-1955 Referring Provider (PT): Andria Meuse, MD   Encounter Date: 11/05/2019   PT End of Session - 11/05/19 1110    Visit Number 7    Date for PT Re-Evaluation 12/03/19    Authorization Type Medicare    Progress Note Due on Visit 10    PT Start Time 1100    PT Stop Time 1145    PT Time Calculation (min) 45 min    Activity Tolerance Patient tolerated treatment well    Behavior During Therapy [facility] for tasks assessed/performed           Past Medical History:  Diagnosis Date  . Anemia   . Anginal pain (HCC)    anxiety  . Anxiety   . Chills   . CTS (carpal tunnel syndrome)    LEFT  . Cyclical vomiting   . Depression   . Diarrhea, unspecified   . Diverticulitis   . Fibromyalgia   . Hemorrhoids   . History of adenomatous polyp of colon   . History of morbid obesity   . Hypothyroid   . Hypothyroidism   . Intractable nausea and vomiting   . Irregular bowel habits   . Joint pain   . LLQ abdominal pain   . Morbid obesity (HCC)   . Nausea and vomiting   . Onychomycosis   . RA (rheumatoid arthritis) (HCC)   . Reflux esophagitis   . Reflux esophagitis   . Sjogren's syndrome (HCC)   . Tinea corporis   . Unspecified mood (affective) disorder (HCC)   . Weight loss     Past Surgical History:  Procedure Laterality Date  . ADENOIDECTOMY    . CARPAL TUNNEL RELEASE Right   . CESAREAN SECTION    . CESAREAN SECTION    . CYSTOSCOPY WITH STENT PLACEMENT N/A 08/01/2019   Procedure: CYSTOSCOPY WITH URETHRAL DILATION, BILATERAL RETROGRADE PYELOGRAMS, TRANSURETHRAL RESECTION OF SMALL BLADDER TUMOR, INSERTION OF BILATERAL OPEN ENDED CATHETERS WITH BILATERAL INJECTION OF FIREFLY;  Surgeon: Bjorn Pippin, MD;   Location: [facility];  Service: Urology;  Laterality: N/A;  . FLEXIBLE SIGMOIDOSCOPY N/A 08/01/2019   Procedure: FLEXIBLE SIGMOIDOSCOPY;  Surgeon: Andria Meuse, MD;  Location: [facility];  Service: General;  Laterality: N/A;  . wisdom teeth      There were no vitals filed for this visit.   Subjective Assessment - 11/05/19 1104    Subjective I am walking better and the stairs are still better too.  I am bleeding a little bit - I think out of the urethra.  I have an appt with the MD next week to check on this.  I notice it when wiping after urination.  It is bright red blood, and only a little bit when I wipe myself.  None in my underwear.    Pertinent History Pt lost 200lb prior to surgery over 2 years through diet/exercise    Limitations Sitting;Lifting;House hold activities    How long can you sit comfortably? 1 hour    Patient Stated Goals get stronger, camp, hike, maintain weight loss with good muscle strength    Currently in Pain? Yes    Pain Score 1     Pain Location Hip    Pain Orientation Left    Pain Descriptors /  Indicators Sore;Tightness    Pain Type Chronic pain    Pain Onset More than a month ago    Pain Frequency Intermittent    Aggravating Factors  with hip flexion activities                             [facility] Adult PT Treatment/Exercise - 11/05/19 0001      Exercises   Exercises Shoulder;Knee/Hip      Knee/Hip Exercises: Stretches   Active Hamstring Stretch Left;30 seconds;2 reps    Active Hamstring Stretch Limitations seated    Hip Flexor Stretch Left;30 seconds    Hip Flexor Stretch Limitations foot on 2nd step      Knee/Hip Exercises: Standing   Knee Flexion Strengthening;Left;AROM;10 reps      Knee/Hip Exercises: Supine   Other Supine Knee/Hip Exercises Lt long axis IR/ER x 20 reps      Knee/Hip Exercises: Sidelying   Hip ABduction Strengthening;Left;5 reps;AAROM      Manual Therapy   Manual Therapy Soft tissue mobilization;Passive  ROM;Joint mobilization    Manual therapy comments passive Lt quad stretch in Rt SL    Joint Mobilization posterior glide supine with hip and knee at 90/90 Gr II/III    Soft tissue mobilization Lt adductors prox and mid-belly, hip flexors Lt    Passive ROM Lt hip flexion/ER painfree range with mob to hip Gr II/III, Lt hip circumduction in Rt SL - small range for tolerance                    PT Short Term Goals - 11/05/19 1111      PT SHORT TERM GOAL #1   Title Pt will be ind with initial HEP including using care with bed mobility and household/car transfers for abdominal healing.    Status Achieved      PT SHORT TERM GOAL #2   Title Pt will demo gentle isolated deep core canister contractions and be able to hold 10x10 sec.    Status Achieved      PT SHORT TERM GOAL #3   Title Pt will report at least 20% reduction in abdominal and Lt hip pain occurrence.    Status Achieved             PT Long Term Goals - 11/05/19 1111      PT LONG TERM GOAL #1   Title Pt will achieve at least 4+/5 strength for bil LEs and trunk to improve tolerance of daily tasks.    Status On-going      PT LONG TERM GOAL #2   Title Pt will report at least 75% improvement in abdominal and Lt hip pain with gait, transfers, and daily tasks.    Baseline met for abdominal pain (80%), 20-30% for hip    Status On-going      PT LONG TERM GOAL #3   Title Pt will demo ability to squat and lift 10lb with proper breath control and core contractions to manage pressures and protect herself from injury for light gardening tasks.    Baseline -    Status On-going      PT LONG TERM GOAL #4   Title Pt will be able to perform seated and standing activities x 1 hour each with pain not to exceed 2/10.    Baseline met for standing activities, not for sitting    Status On-going      PT LONG TERM  GOAL #5   Title Pt will be able to perform floor to stand transfer with use of single UE support with proper mechanics  and core control to improve safety with daily activity.    Status On-going                 Plan - 11/05/19 1141    Clinical Impression Statement Pt has a call into surgeon/MD due to onset of light bleeding she noticed last night when wiping after urination.  She feels it is coming from urethra.  PT avoided any heavy ther ex until she sees MD next week, putting on hold goal of working toward lifting and floor transfer.  Pt is making progress toward LTGs with improved tolerance of standing activities.  She has less asymmetry in gait and stairs due to improved mobility and strength of Lt hip.  She continues to have pain with flexion/abd movement actively but when PT performs passively with joint mob/glide assist she has much greater range.  PT performed STM to adductors and noted Pt's ability to tolerate passive hip flex/abd for adductor stretch following joint mobs to Lt hip. Continue along POC with close monitoring of response to intervensions.    Comorbidities HTN, Sjogren's Syndrome, hypothyroid, fibromyalgia. On disability since 2012    Rehab Potential Good    PT Frequency 2x / week    PT Duration 8 weeks    PT Treatment/Interventions ADLs/Self Care Home Management;Biofeedback;Cryotherapy;Electrical Stimulation;Moist Heat;Manual techniques;Neuromuscular re-education;Balance training;Patient/family education;Gait training;Functional mobility training;Therapeutic activities;Therapeutic exercise;Joint Manipulations;Spinal Manipulations;Passive range of motion;Dry needling    PT Next Visit Plan Lt adductor STM, Lt hip joint mobs, try 2" step ups fwd/lat, f/u on Pt report of bleeding    PT Home Exercise Plan Access Code: [email or web address]    Consulted and Agree with Plan of Care Patient           Patient will benefit from skilled therapeutic intervention in order to improve the following deficits and impairments:  Abnormal gait, Decreased range of motion, Difficulty walking, Decreased endurance, Pain,  Decreased skin integrity, Improper body mechanics, Postural dysfunction, Decreased strength, Decreased mobility, Decreased balance, Decreased scar mobility, Hypomobility, Impaired flexibility  Visit Diagnosis: Muscle weakness (generalized)  Pain in left hip     Problem List Patient Active Problem List   Diagnosis Date Noted  . Status post laparoscopic-assisted sigmoidectomy 08/01/2019    Baruch Merl, PT 11/05/19 12:44 PM   [facility] [address] [place], [place], [address] Phone: [phone]   Fax:  [phone]  Name: Jennifer Franco MRN: [id number] Date of Birth: 1956-03-06

## 2019-11-12 ENCOUNTER — Ambulatory Visit: Payer: Medicare Other | Attending: Surgery | Admitting: Physical Therapy

## 2019-11-12 ENCOUNTER — Encounter: Payer: Self-pay | Admitting: Physical Therapy

## 2019-11-12 ENCOUNTER — Other Ambulatory Visit: Payer: Self-pay

## 2019-11-12 DIAGNOSIS — M6281 Muscle weakness (generalized): Secondary | ICD-10-CM | POA: Insufficient documentation

## 2019-11-12 DIAGNOSIS — M25552 Pain in left hip: Secondary | ICD-10-CM | POA: Insufficient documentation

## 2019-11-12 NOTE — Therapy (Signed)
[facility] [address], [place], [place], [address] Phone: [phone]   Fax:  [phone]  Physical Therapy Treatment  Patient Details  Name: Jennifer Franco MRN: [id number] Date of Birth: 1956/03/03 Referring Provider (PT): Ileana Roup, MD   Encounter Date: 11/12/2019   PT End of Session - 11/12/19 1145    Visit Number 8    Date for PT Re-Evaluation 12/03/19    Authorization Type Medicare    PT Start Time 1105    PT Stop Time 1145    PT Time Calculation (min) 40 min    Activity Tolerance Patient tolerated treatment well    Behavior During Therapy [facility] for tasks assessed/performed           Past Medical History:  Diagnosis Date  . Anemia   . Anginal pain (HCC)    anxiety  . Anxiety   . Chills   . CTS (carpal tunnel syndrome)    LEFT  . Cyclical vomiting   . Depression   . Diarrhea, unspecified   . Diverticulitis   . Fibromyalgia   . Hemorrhoids   . History of adenomatous polyp of colon   . History of morbid obesity   . Hypothyroid   . Hypothyroidism   . Intractable nausea and vomiting   . Irregular bowel habits   . Joint pain   . LLQ abdominal pain   . Morbid obesity ([place])   . Nausea and vomiting   . Onychomycosis   . RA (rheumatoid arthritis) ([place])   . Reflux esophagitis   . Reflux esophagitis   . Sjogren's syndrome ([place])   . Tinea corporis   . Unspecified mood (affective) disorder ([place])   . Weight loss     Past Surgical History:  Procedure Laterality Date  . ADENOIDECTOMY    . CARPAL TUNNEL RELEASE Right   . CESAREAN SECTION    . CESAREAN SECTION    . CYSTOSCOPY WITH STENT PLACEMENT N/A 08/01/2019   Procedure: CYSTOSCOPY WITH URETHRAL DILATION, BILATERAL RETROGRADE PYELOGRAMS, TRANSURETHRAL RESECTION OF SMALL BLADDER TUMOR, INSERTION OF BILATERAL OPEN ENDED CATHETERS WITH BILATERAL INJECTION OF FIREFLY;  Surgeon: Irine Seal, MD;  Location: [facility];  Service: Urology;   Laterality: N/A;  . FLEXIBLE SIGMOIDOSCOPY N/A 08/01/2019   Procedure: FLEXIBLE SIGMOIDOSCOPY;  Surgeon: Ileana Roup, MD;  Location: [facility];  Service: General;  Laterality: N/A;  . wisdom teeth      There were no vitals filed for this visit.   Subjective Assessment - 11/12/19 1108    Subjective I haven't had more episodes of bleeding.  I've been very active doing yardwork and things around the house.    Pertinent History Pt lost 200lb prior to surgery over 2 years through diet/exercise    Limitations Sitting;Lifting;House hold activities    How long can you sit comfortably? 1 hour    Patient Stated Goals get stronger, camp, hike, maintain weight loss with good muscle strength    Currently in Pain? Yes    Pain Score 2     Pain Location Hip    Pain Orientation Left    Pain Descriptors / Indicators Sore;Tightness    Pain Type Chronic pain    Pain Onset More than a month ago    Pain Frequency Intermittent    Aggravating Factors  hip flexion activities  [facility] Adult PT Treatment/Exercise - 11/12/19 0001      Exercises   Exercises Lumbar;Knee/Hip      Lumbar Exercises: Aerobic   UBE (Upper Arm Bike) 2x2 L1.5, PT present to discuss symptoms      Knee/Hip Exercises: Stretches   Hip Flexor Stretch Left;30 seconds    Hip Flexor Stretch Limitations foot on 2nd step    Other Knee/Hip Stretches quad stretch Lt, standing, 1x30 sec      Knee/Hip Exercises: Standing   Hip Extension Left;10 reps;Knee straight    Forward Step Up Left;2 sets;5 reps;Hand Hold: 2;Step Height: 6"    Rebounder weight shifts side to side and in stagger stance Lt foot forward x 1' each, alt march x 1' each, Pt cued stand tall through hip and use TrA     Other Standing Knee Exercises Lt hip march to step, PT manual assist post/inf glide femoral head x 10 reps      Manual Therapy   Joint Mobilization posterior glide supine with hip and knee at 90/90 Gr II/III     Soft tissue mobilization Lt hip flexors, piriformis, glut med    Passive ROM long axis Lt LE IR/ER    Manual Traction Lt LE traction supine in mild hip flexion/abd Gr III/IV x 3'            Trigger Point Dry Needling - 11/12/19 0001    Consent Given? Yes    Education Handout Provided Previously provided    Muscles Treated Back/Hip Gluteus medius;Piriformis    Other Dry Needling Lt    Gluteus Medius Response Twitch response elicited;Palpable increased muscle length    Piriformis Response Twitch response elicited;Palpable increased muscle length                  PT Short Term Goals - 11/05/19 1111      PT SHORT TERM GOAL #1   Title Pt will be ind with initial HEP including using care with bed mobility and household/car transfers for abdominal healing.    Status Achieved      PT SHORT TERM GOAL #2   Title Pt will demo gentle isolated deep core canister contractions and be able to hold 10x10 sec.    Status Achieved      PT SHORT TERM GOAL #3   Title Pt will report at least 20% reduction in abdominal and Lt hip pain occurrence.    Status Achieved             PT Long Term Goals - 11/05/19 1111      PT LONG TERM GOAL #1   Title Pt will achieve at least 4+/5 strength for bil LEs and trunk to improve tolerance of daily tasks.    Status On-going      PT LONG TERM GOAL #2   Title Pt will report at least 75% improvement in abdominal and Lt hip pain with gait, transfers, and daily tasks.    Baseline met for abdominal pain (80%), 20-30% for hip    Status On-going      PT LONG TERM GOAL #3   Title Pt will demo ability to squat and lift 10lb with proper breath control and core contractions to manage pressures and protect herself from injury for light gardening tasks.    Baseline -    Status On-going      PT LONG TERM GOAL #4   Title Pt will be able to perform seated and standing activities x 1 hour  each with pain not to exceed 2/10.    Baseline met for standing  activities, not for sitting    Status On-going      PT LONG TERM GOAL #5   Title Pt will be able to perform floor to stand transfer with use of single UE support with proper mechanics and core control to improve safety with daily activity.    Status On-going                 Plan - 11/12/19 1109    Clinical Impression Statement Pt reports ongoing improvement in Lt hip and abdominal pain with increased tolerance of yard and housework.  She continues to use bil LEs with more symmetry in gait and with stairs.  She is careful not to lfit anything too heavy.  She has not had any further episodes of bleeding since late last week.  Pt was able to perform increased strength exersises in open and closed chain for Lt hip today, although pain did increase from 2 to 3/10. TPs in lateral hip are becoming more localized.  PT discussed likelihood of d/c to HEP end of the month.  continue along POC.    Comorbidities HTN, Sjogren's Syndrome, hypothyroid, fibromyalgia. On disability since 2012    Rehab Potential Good    PT Frequency 2x / week    PT Duration 8 weeks    PT Treatment/Interventions ADLs/Self Care Home Management;Biofeedback;Cryotherapy;Electrical Stimulation;Moist Heat;Manual techniques;Neuromuscular re-education;Balance training;Patient/family education;Gait training;Functional mobility training;Therapeutic activities;Therapeutic exercise;Joint Manipulations;Spinal Manipulations;Passive range of motion;Dry needling    PT Next Visit Plan continue Lt hip strength and manual    PT Home Exercise Plan Access Code: [email or web address]    Consulted and Agree with Plan of Care Patient           Patient will benefit from skilled therapeutic intervention in order to improve the following deficits and impairments:     Visit Diagnosis: Muscle weakness (generalized)  Pain in left hip     Problem List Patient Active Problem List   Diagnosis Date Noted  . Status post laparoscopic-assisted sigmoidectomy  08/01/2019    Baruch Merl, PT 11/12/19 11:49 AM   [facility] [address] [place], [place], [address] Phone: [phone]   Fax:  [phone]  Name: MEGHNA HAGMANN MRN: [id number] Date of Birth: 12/07/1955

## 2019-11-19 ENCOUNTER — Encounter: Payer: Self-pay | Admitting: Physical Therapy

## 2019-11-19 ENCOUNTER — Other Ambulatory Visit: Payer: Self-pay

## 2019-11-19 ENCOUNTER — Ambulatory Visit: Payer: Medicare Other | Admitting: Physical Therapy

## 2019-11-19 DIAGNOSIS — M6281 Muscle weakness (generalized): Secondary | ICD-10-CM | POA: Diagnosis not present

## 2019-11-19 DIAGNOSIS — M25552 Pain in left hip: Secondary | ICD-10-CM

## 2019-11-19 NOTE — Therapy (Addendum)
[facility] [address] [place], [place], [address] Phone: [phone]   Fax:  [phone]  Physical Therapy Treatment  Patient Details  Name: Jennifer Franco MRN: [id number] Date of Birth: 1955/08/14 Referring Provider (PT): Ileana Roup, MD   Encounter Date: 11/19/2019   PT End of Session - 11/19/19 1316    Visit Number 9    Date for PT Re-Evaluation 12/03/19    Authorization Type BCBS/Medicare    Progress Note Due on Visit 10    PT Start Time 1230    PT Stop Time 1315    PT Time Calculation (min) 45 min    Activity Tolerance Patient tolerated treatment well    Behavior During Therapy [facility] for tasks assessed/performed           Past Medical History:  Diagnosis Date  . Anemia   . Anginal pain (HCC)    anxiety  . Anxiety   . Chills   . CTS (carpal tunnel syndrome)    LEFT  . Cyclical vomiting   . Depression   . Diarrhea, unspecified   . Diverticulitis   . Fibromyalgia   . Hemorrhoids   . History of adenomatous polyp of colon   . History of morbid obesity   . Hypothyroid   . Hypothyroidism   . Intractable nausea and vomiting   . Irregular bowel habits   . Joint pain   . LLQ abdominal pain   . Morbid obesity ([place])   . Nausea and vomiting   . Onychomycosis   . RA (rheumatoid arthritis) ([place])   . Reflux esophagitis   . Reflux esophagitis   . Sjogren's syndrome ([place])   . Tinea corporis   . Unspecified mood (affective) disorder ([place])   . Weight loss     Past Surgical History:  Procedure Laterality Date  . ADENOIDECTOMY    . CARPAL TUNNEL RELEASE Right   . CESAREAN SECTION    . CESAREAN SECTION    . CYSTOSCOPY WITH STENT PLACEMENT N/A 08/01/2019   Procedure: CYSTOSCOPY WITH URETHRAL DILATION, BILATERAL RETROGRADE PYELOGRAMS, TRANSURETHRAL RESECTION OF SMALL BLADDER TUMOR, INSERTION OF BILATERAL OPEN ENDED CATHETERS WITH BILATERAL INJECTION OF FIREFLY;  Surgeon: Irine Seal, MD;   Location: [facility];  Service: Urology;  Laterality: N/A;  . FLEXIBLE SIGMOIDOSCOPY N/A 08/01/2019   Procedure: FLEXIBLE SIGMOIDOSCOPY;  Surgeon: Ileana Roup, MD;  Location: [facility];  Service: General;  Laterality: N/A;  . wisdom teeth      There were no vitals filed for this visit.   Subjective Assessment - 11/19/19 1233    Subjective I pulled a muscle in my shoulder blade.  I saw the MD - no signs of CA that he could see.  I don't have to go back x 61mo.  No more bleeding episodes.  My hip is so much better.  Stairs are even better.  I think I limp sometimes as a habit.    Pertinent History Pt lost 200lb prior to surgery over 2 years through diet/exercise    How long can you sit comfortably? 1 hour    Patient Stated Goals get stronger, camp, hike, maintain weight loss with good muscle strength    Currently in Pain? No/denies                             [facility]      Knee/Hip Exercises:  Standing   Lateral Step Up Left;10 reps;Step Height: 4";Hand Hold: 1    Functional Squat 5 reps    Functional Squat Limitations lift 5lb and 10lb x 5 reps each for LTG from low table    SLS with Vectors stand on 4" riser on Lt LE and pulse Rt LE fwd/bwd and side to side small range x 20 each for Lt hip stability    Other Standing Knee Exercises rockerboard x 2' with A/P imbalance, no UE support    PT TCs for proper posture intermittently with improved TrA     Manual Therapy   Manual Therapy Soft tissue mobilization    Joint Mobilization Rt intrascap costotransverse approximation and PAs Gr II/III    Soft tissue mobilization Rt upper trap and levator scap stripping and broadening                    PT Short Term Goals - 11/05/19 1111      PT SHORT TERM GOAL #1   Title Pt will be ind with initial HEP including using care with bed mobility and household/car transfers for abdominal healing.    Status Achieved      PT SHORT  TERM GOAL #2   Title Pt will demo gentle isolated deep core canister contractions and be able to hold 10x10 sec.    Status Achieved      PT SHORT TERM GOAL #3   Title Pt will report at least 20% reduction in abdominal and Lt hip pain occurrence.    Status Achieved             PT Long Term Goals - 11/19/19 1315      PT LONG TERM GOAL #1   Title Pt will achieve at least 4+/5 strength for bil LEs and trunk to improve tolerance of daily tasks.    Status Achieved      PT LONG TERM GOAL #2   Title Pt will report at least 75% improvement in abdominal and Lt hip pain with gait, transfers, and daily tasks.    Status Achieved      PT LONG TERM GOAL #3   Title Pt will demo ability to squat and lift 10lb with proper breath control and core contractions to manage pressures and protect herself from injury for light gardening tasks.    Baseline from low table, not from ground    Status On-going      PT LONG TERM GOAL #4   Title Pt will be able to perform seated and standing activities x 1 hour each with pain not to exceed 2/10.    Status Achieved      PT LONG TERM GOAL #5   Title Pt will be able to perform floor to stand transfer with use of single UE support with proper mechanics and core control to improve safety with daily activity.    Status On-going                 Plan - 11/19/19 1317    Clinical Impression Statement Pt reports signif improvement in Lt hip pain.  She no longer has pain with stairs.  She demos improved symmetry in gait although reports habits come back and she finds herself limping but can catch herself and control this.  PT focused on closed chain hip strenth and stability on Lt today and worked on 5 and 10lb lifts in squat with VCs for hip hinge and TrA use.  She did report  a muscle pull in upper back which prevented some UE ther ex so PT performed manual techniques to address this with hopes Pt may continue more UE ther ex and trunk work via UE ther ex next  visit.  Pt continues to be very active throughout the day and reports much improved tolerance of higher activity levels.  She is on track to meet LTGs.  She will benefit from finalizing HEP and body mechanics next two vists.    Comorbidities HTN, Sjogren's Syndrome, hypothyroid, fibromyalgia. On disability since 2012    Rehab Potential Good    PT Frequency 2x / week    PT Duration 8 weeks    PT Treatment/Interventions ADLs/Self Care Home Management;Biofeedback;Cryotherapy;Electrical Stimulation;Moist Heat;Manual techniques;Neuromuscular re-education;Balance training;Patient/family education;Gait training;Functional mobility training;Therapeutic activities;Therapeutic exercise;Joint Manipulations;Spinal Manipulations;Passive range of motion;Dry needling    PT Next Visit Plan continue Lt hip and trunk strength and manual, f/u on Rt upper trap/levator/intrascap pain from last visit    PT Home Exercise Plan Access Code: [email or web address]    Consulted and Agree with Plan of Care Patient           Patient will benefit from skilled therapeutic intervention in order to improve the following deficits and impairments:     Visit Diagnosis: Muscle weakness (generalized)  Pain in left hip     Problem List Patient Active Problem List   Diagnosis Date Noted  . Status post laparoscopic-assisted sigmoidectomy 08/01/2019    Baruch Merl, PT 11/19/19 1:21 PM  PHYSICAL THERAPY DISCHARGE SUMMARY  Visits from Start of Care: 9  Current functional level related to goals / functional outcomes: See above - Pt missed re-evaluation due to cancellation of appointment.   Remaining deficits: See above   Education / Equipment: HEP Plan: Patient agrees to discharge.  Patient goals were partially met. Patient is being discharged due to being pleased with the current functional level.  ?????          Baruch Merl, PT 12/11/19 10:46 AM   [facility] [address] [place], [place], [address] Phone: [phone]   Fax:  [phone]  Name: Jennifer Franco MRN: [id number] Date of Birth: 1955/06/16

## 2019-11-20 ENCOUNTER — Encounter: Payer: Medicare Other | Admitting: Physical Therapy

## 2019-12-03 ENCOUNTER — Ambulatory Visit: Payer: Medicare Other | Admitting: Physical Therapy

## 2019-12-10 ENCOUNTER — Encounter: Payer: Medicare Other | Admitting: Physical Therapy

## 2019-12-11 ENCOUNTER — Ambulatory Visit: Payer: BC Managed Care – PPO | Admitting: Physical Therapy

## 2020-01-17 ENCOUNTER — Other Ambulatory Visit: Payer: Self-pay | Admitting: Family Medicine

## 2020-01-17 DIAGNOSIS — Z1231 Encounter for screening mammogram for malignant neoplasm of breast: Secondary | ICD-10-CM

## 2020-01-29 ENCOUNTER — Ambulatory Visit: Payer: Medicare Other

## 2020-01-30 ENCOUNTER — Ambulatory Visit
Admission: RE | Admit: 2020-01-30 | Discharge: 2020-01-30 | Disposition: A | Discharge status: Home / Self Care | Payer: Medicare Other | Source: Ambulatory Visit | Attending: Family Medicine | Admitting: Family Medicine

## 2020-01-30 ENCOUNTER — Other Ambulatory Visit: Payer: Self-pay

## 2020-01-30 DIAGNOSIS — Z1231 Encounter for screening mammogram for malignant neoplasm of breast: Secondary | ICD-10-CM

## 2020-03-31 ENCOUNTER — Other Ambulatory Visit: Payer: Self-pay | Admitting: Family Medicine

## 2020-03-31 DIAGNOSIS — E2839 Other primary ovarian failure: Secondary | ICD-10-CM

## 2020-06-26 ENCOUNTER — Other Ambulatory Visit: Payer: Self-pay

## 2020-06-26 ENCOUNTER — Ambulatory Visit (INDEPENDENT_AMBULATORY_CARE_PROVIDER_SITE_OTHER): Payer: Medicare PPO | Admitting: Podiatry

## 2020-06-26 DIAGNOSIS — B351 Tinea unguium: Secondary | ICD-10-CM

## 2020-07-05 ENCOUNTER — Encounter: Payer: Self-pay | Admitting: Podiatry

## 2020-07-05 MED ORDER — EFINACONAZOLE 10 % EX SOLN: 8.0000 mL | Freq: Two times a day (BID) | CUTANEOUS | 3 refills | Status: DC

## 2020-07-05 NOTE — Progress Notes (Signed)
Subjective:  Patient ID: Jennifer Franco, female    DOB: [birth year],  MRN: [id number]  Chief Complaint  Patient presents with  . Nail Problem    Nail fungus     65 y.o. female presents with the above complaint.  Patient presents with complaint of nail fungus to primarily her right third digit.  Patient states that she has been applying Jublia to the nail which seems to be helping a lot.  She states that she would like to get a refill on Jublia as it seems to help her a lot.  She had received Jublia from her previous physician however she would like to change her physician as he is not currently practicing.  She denies any other acute complaints.  There is mild pain to touch.  She would like to discuss all the treatment options that are available.   Review of Systems: Negative except as noted in the HPI. Denies N/V/F/Ch.  Past Medical History:  Diagnosis Date  . Anemia   . Anginal pain (HCC)    anxiety  . Anxiety   . Chills   . CTS (carpal tunnel syndrome)    LEFT  . Cyclical vomiting   . Depression   . Diarrhea, unspecified   . Diverticulitis   . Fibromyalgia   . Hemorrhoids   . History of adenomatous polyp of colon   . History of morbid obesity   . Hypothyroid   . Hypothyroidism   . Intractable nausea and vomiting   . Irregular bowel habits   . Joint pain   . LLQ abdominal pain   . Morbid obesity ([place])   . Nausea and vomiting   . Onychomycosis   . RA (rheumatoid arthritis) ([place])   . Reflux esophagitis   . Reflux esophagitis   . Sjogren's syndrome ([place])   . Tinea corporis   . Unspecified mood (affective) disorder ([place])   . Weight loss     Current Outpatient Medications:  .  Efinaconazole 10 % SOLN, Apply 8 mLs topically in the morning and at bedtime., Disp: 8 mL, Rfl: 3 .  folic acid (FOLVITE) 1 MG tablet, Take 1 mg by mouth daily. , Disp: , Rfl:  .  hydroxychloroquine (PLAQUENIL) 200 MG tablet, Take 200 mg by mouth 2 (two) times daily., Disp: , Rfl:  .   ibuprofen (ADVIL) 200 MG tablet, Take 400 mg by mouth every 8 (eight) hours as needed for moderate pain. , Disp: , Rfl:  .  iron polysaccharides (NIFEREX) 150 MG capsule, Take 150 mg by mouth daily. , Disp: , Rfl:  .  Krill Oil 500 MG CAPS, Take 500 mg by mouth at bedtime. , Disp: , Rfl:  .  levothyroxine (SYNTHROID) 125 MCG tablet, Take 125 mcg by mouth daily before breakfast. , Disp: , Rfl:  .  Olive Leaf Extract 250 MG CAPS, Take 250 capsules by mouth at bedtime. , Disp: , Rfl:  .  ondansetron (ZOFRAN ODT) 4 MG disintegrating tablet, Take 1 tablet (4 mg total) by mouth every 8 (eight) hours as needed for nausea or vomiting., Disp: 4 tablet, Rfl: 0 .  promethazine (PHENERGAN) 25 MG tablet, Take 1 tablet (25 mg total) by mouth every 6 (six) hours as needed for nausea or vomiting., Disp: 12 tablet, Rfl: 0 .  sucralfate (CARAFATE) 1 g tablet, Take 1 tablet by mouth 2 (two) times daily., Disp: , Rfl:  .  tretinoin (RETIN-A) 0.025 % cream, APPLY TO THE FACE EVERY NIGHT AT BEDTIME  AS TOLERATED FOR ACNE , Disp: 45 g, Rfl: 3  Social History   Tobacco Use  Smoking Status Former Smoker  Smokeless Tobacco Never Used    Allergies  Allergen Reactions  . Etodolac Diarrhea  . Topamax [Topiramate] Other (See Comments)    Brain flutters    Objective:  There were no vitals filed for this visit. There is no height or weight on file to calculate BMI. Constitutional Well developed. Well nourished.  Vascular Dorsalis pedis pulses palpable bilaterally. Posterior tibial pulses palpable bilaterally. Capillary refill normal to all digits.  No cyanosis or clubbing noted. Pedal hair growth normal.  Neurologic Normal speech. Oriented to person, place, and time. Epicritic sensation to light touch grossly present bilaterally.  Dermatologic  right third digit mild signs of thickened elongated dystrophic toenails noted.  Mild pain on palpation.  Findings consistent with onychomycosis  Orthopedic: Normal joint  ROM without pain or crepitus bilaterally. No visible deformities. No bony tenderness.   Radiographs: None Assessment:   1. Onychomycosis due to dermatophyte   2. Nail fungus    Plan:  Patient was evaluated and treated and all questions answered.  Right third digit onychomycosis -Educated the patient on the etiology of onychomycosis and various treatment options associated with improving the fungal load.  I explained to the patient that there is 3 treatment options available to treat the onychomycosis including topical, p.o., laser treatment.  Patient elected to undergo treatment options with Jublia/topical medication.  I instructed her to apply twice a day to the affected nail for 6 to 8 months.  The prescription for Jublia was sent to the pharmacy.   No follow-ups on file.

## 2020-07-13 ENCOUNTER — Other Ambulatory Visit: Payer: Self-pay | Admitting: Family Medicine

## 2020-07-13 ENCOUNTER — Other Ambulatory Visit: Payer: Medicare Other

## 2020-07-13 DIAGNOSIS — E2839 Other primary ovarian failure: Secondary | ICD-10-CM

## 2020-07-14 ENCOUNTER — Other Ambulatory Visit: Payer: Medicare Other

## 2020-08-13 ENCOUNTER — Other Ambulatory Visit: Payer: Self-pay

## 2020-08-13 ENCOUNTER — Ambulatory Visit
Admission: RE | Admit: 2020-08-13 | Discharge: 2020-08-13 | Disposition: A | Discharge status: Home / Self Care | Payer: Medicare PPO | Source: Ambulatory Visit | Attending: Family Medicine | Admitting: Family Medicine

## 2020-08-13 DIAGNOSIS — E2839 Other primary ovarian failure: Secondary | ICD-10-CM

## 2020-12-23 DIAGNOSIS — K388 Other specified diseases of appendix: Secondary | ICD-10-CM | POA: Diagnosis not present

## 2020-12-23 DIAGNOSIS — M79645 Pain in left finger(s): Secondary | ICD-10-CM | POA: Diagnosis not present

## 2020-12-23 DIAGNOSIS — M25532 Pain in left wrist: Secondary | ICD-10-CM | POA: Diagnosis not present

## 2020-12-23 DIAGNOSIS — G5602 Carpal tunnel syndrome, left upper limb: Secondary | ICD-10-CM | POA: Diagnosis not present

## 2020-12-23 DIAGNOSIS — M1812 Unilateral primary osteoarthritis of first carpometacarpal joint, left hand: Secondary | ICD-10-CM | POA: Diagnosis not present

## 2021-01-06 NOTE — Progress Notes (Signed)
Please enter orders for PAT visit scheduled for 01-20-21

## 2021-01-07 ENCOUNTER — Ambulatory Visit: Payer: Self-pay | Admitting: Surgery

## 2021-01-14 DIAGNOSIS — Z23 Encounter for immunization: Secondary | ICD-10-CM | POA: Diagnosis not present

## 2021-01-18 ENCOUNTER — Other Ambulatory Visit (HOSPITAL_COMMUNITY): Payer: Self-pay

## 2021-01-19 NOTE — Progress Notes (Addendum)
COVID swab appointment: 01/26/21  COVID Vaccine Completed: yes x3 Date COVID Vaccine completed: Has received booster: COVID vaccine manufacturer: Pfizer      Date of COVID positive in last 90 days: No  PCP - Kelton Pillar, MD Cardiologist - N/a  Chest x-ray - N/a EKG - N/a Stress Test - long time ago ECHO - long time ago per pt Cardiac Cath - N/a Pacemaker/ICD device last checked: N/a Spinal Cord Stimulator: N/a  Sleep Study - N/a CPAP -   Fasting Blood Sugar - N/a Checks Blood Sugar _____ times a day  Blood Thinner Instructions: N/a Aspirin Instructions: Last Dose:  Activity level: Can go up a flight of stairs and perform activities of daily living without stopping and without symptoms of chest pain or shortness of breath.    Anesthesia review:   Patient denies shortness of breath, fever, cough and chest pain at PAT appointment   Patient verbalized understanding of instructions that were given to them at the PAT appointment. Patient was also instructed that they will need to review over the PAT instructions again at home before surgery.

## 2021-01-20 ENCOUNTER — Encounter (HOSPITAL_COMMUNITY)
Admission: RE | Admit: 2021-01-20 | Discharge: 2021-01-20 | Disposition: A | Discharge status: Home / Self Care | Payer: Medicare PPO | Source: Ambulatory Visit | Attending: Surgery | Admitting: Surgery

## 2021-01-20 ENCOUNTER — Encounter (HOSPITAL_COMMUNITY): Payer: Self-pay

## 2021-01-20 ENCOUNTER — Other Ambulatory Visit: Payer: Self-pay

## 2021-01-20 ENCOUNTER — Other Ambulatory Visit (HOSPITAL_COMMUNITY): Payer: Self-pay

## 2021-01-20 DIAGNOSIS — Z01812 Encounter for preprocedural laboratory examination: Secondary | ICD-10-CM | POA: Insufficient documentation

## 2021-01-20 HISTORY — DX: Malignant (primary) neoplasm, unspecified: C80.1

## 2021-01-20 LAB — CBC WITH DIFFERENTIAL/PLATELET
Abs Immature Granulocytes: 0.01 10*3/uL (ref 0.00–0.07)
Basophils Absolute: 0 10*3/uL (ref 0.0–0.1)
Basophils Relative: 1 %
Eosinophils Absolute: 0 10*3/uL (ref 0.0–0.5)
Eosinophils Relative: 1 %
HCT: 40.3 % (ref 36.0–46.0)
Hemoglobin: 13.1 g/dL (ref 12.0–15.0)
Immature Granulocytes: 0 %
Lymphocytes Relative: 41 %
Lymphs Abs: 1.8 10*3/uL (ref 0.7–4.0)
MCH: 29 pg (ref 26.0–34.0)
MCHC: 32.5 g/dL (ref 30.0–36.0)
MCV: 89.2 fL (ref 80.0–100.0)
Monocytes Absolute: 0.4 10*3/uL (ref 0.1–1.0)
Monocytes Relative: 9 %
Neutro Abs: 2.1 10*3/uL (ref 1.7–7.7)
Neutrophils Relative %: 48 %
Platelets: 228 10*3/uL (ref 150–400)
RBC: 4.52 MIL/uL (ref 3.87–5.11)
RDW: 14.4 % (ref 11.5–15.5)
WBC: 4.4 10*3/uL (ref 4.0–10.5)
nRBC: 0 % (ref 0.0–0.2)

## 2021-01-20 LAB — COMPREHENSIVE METABOLIC PANEL
ALT: 15 U/L (ref 0–44)
AST: 16 U/L (ref 15–41)
Albumin: 4.2 g/dL (ref 3.5–5.0)
Alkaline Phosphatase: 51 U/L (ref 38–126)
Anion gap: 8 (ref 5–15)
BUN: 10 mg/dL (ref 8–23)
CO2: 27 mmol/L (ref 22–32)
Calcium: 9.8 mg/dL (ref 8.9–10.3)
Chloride: 106 mmol/L (ref 98–111)
Creatinine, Ser: 0.82 mg/dL (ref 0.44–1.00)
GFR, Estimated: >60 mL/min (ref >60)
Glucose, Bld: 105 mg/dL — ABNORMAL HIGH (ref 70–99)
Potassium: 3.4 mmol/L — ABNORMAL LOW (ref 3.5–5.1)
Sodium: 141 mmol/L (ref 135–145)
Total Bilirubin: 0.7 mg/dL (ref 0.3–1.2)
Total Protein: 6.8 g/dL (ref 6.5–8.1)

## 2021-01-20 NOTE — Patient Instructions (Addendum)
DUE TO COVID-19 ONLY ONE VISITOR IS ALLOWED TO COME WITH YOU AND STAY IN THE WAITING ROOM ONLY DURING PRE OP AND PROCEDURE.   **NO VISITORS ARE ALLOWED IN THE SHORT STAY AREA OR RECOVERY ROOM!!**  IF YOU WILL BE ADMITTED INTO THE HOSPITAL YOU ARE ALLOWED ONLY TWO SUPPORT PEOPLE DURING VISITATION HOURS ONLY (10AM -8PM)   The support person(s) may change daily. The support person(s) must pass our screening, gel in and out, and wear a mask at all times, including in the patient's room. Patients must also wear a mask when staff or their support person are in the room.  No visitors under the age of 80. Any visitor under the age of 6 must be accompanied by an adult.    COVID SWAB TESTING MUST BE COMPLETED ON:  01/26/21 **MUST PRESENT COMPLETED FORM AT TESTING SITE**    [facility] (backside of the building) No appointment needed. Open 8am-3pm. You are not required to quarantine, however you are required to wear a well-fitted mask when you are out and around people not in your household.  Hand Hygiene often Do NOT share personal items Notify your provider if you are in close contact with someone who has COVID or you develop fever 100.4 or greater, new onset of sneezing, cough, sore throat, shortness of breath or body aches.       Your procedure is scheduled on: 01/28/21   Report to [facility] Main  Entrance    Report to admitting at 7:45 AM   Call this number if you have problems the morning of surgery [phone]   Follow clear liquid diet day before   May have liquids until 7:00 AM day of surgery  CLEAR LIQUID DIET  Foods Allowed                                                                     Foods Excluded  Water, Black Coffee and tea (no milk or creamer)           liquids that you cannot  Plain Jell-O in any flavor  (No red)                                    see through such as: Fruit ices (not with fruit pulp)                                             milk, soups, orange juice              Iced Popsicles (No red)                                                All solid food  Apple juices Sports drinks like Gatorade (No red) Lightly seasoned clear broth or consume(fat free) Sugar  Drink 2 Ensure drinks the night before surgery by 10pm.  Complete one Ensure drink the morning of surgery 3 hours prior to scheduled surgery at 7AM.     The day of surgery:  Drink ONE (1) Pre-Surgery Clear Ensure by 7:00 am the morning of surgery. Drink in one sitting. Do not sip.  This drink was given to you during your hospital  pre-op appointment visit. Nothing else to drink after completing the  Pre-Surgery Clear Ensure.          If you have questions, please contact your surgeon's office.     Oral Hygiene is also important to reduce your risk of infection.                                    Remember - BRUSH YOUR TEETH THE MORNING OF SURGERY WITH YOUR REGULAR TOOTHPASTE   Take these medicines the morning of surgery with A SIP OF WATER: Plaquenil, Levothyroxine                           You may not have any metal on your body including hair pins, jewelry, and body piercing             Do not wear make-up, lotions, powders, perfumes, or deodorant  Do not wear nail polish including gel and S&S, artificial/acrylic nails, or any other type of covering on natural nails including finger and toenails. If you have artificial nails, gel coating, etc. that needs to be removed by a nail salon please have this removed prior to surgery or surgery may need to be canceled/ delayed if the surgeon/ anesthesia feels like they are unable to be safely monitored.   Do not shave  48 hours prior to surgery.    Do not bring valuables to the hospital. [place].   Bring small overnight bag day of surgery.   Please read over the following fact sheets you were given: IF YOU HAVE  QUESTIONS ABOUT YOUR PRE OP INSTRUCTIONS PLEASE CALL [phone]- [place] - Preparing for Surgery Before surgery, you can play an important role.  Because skin is not sterile, your skin needs to be as free of germs as possible.  You can reduce the number of germs on your skin by washing with CHG (chlorahexidine gluconate) soap before surgery.  CHG is an antiseptic cleaner which kills germs and bonds with the skin to continue killing germs even after washing. Please DO NOT use if you have an allergy to CHG or antibacterial soaps.  If your skin becomes reddened/irritated stop using the CHG and inform your nurse when you arrive at Short Stay. Do not shave (including legs and underarms) for at least 48 hours prior to the first CHG shower.  You may shave your face/neck.  Please follow these instructions carefully:  1.  Shower with CHG Soap the night before surgery and the  morning of surgery.  2.  If you choose to wash your hair, wash your hair first as usual with your normal  shampoo.  3.  After you shampoo, rinse your hair and body thoroughly to remove the shampoo.  4.  Use CHG as you would any other liquid soap.  You can apply chg directly to the skin and wash.  Gently with a scrungie or clean washcloth.  5.  Apply the CHG Soap to your body ONLY FROM THE NECK DOWN.   Do   not use on face/ open                           Wound or open sores. Avoid contact with eyes, ears mouth and   genitals (private parts).                       Wash face,  Genitals (private parts) with your normal soap.             6.  Wash thoroughly, paying special attention to the area where your    surgery  will be performed.  7.  Thoroughly rinse your body with warm water from the neck down.  8.  DO NOT shower/wash with your normal soap after using and rinsing off the CHG Soap.                9.  Pat yourself dry with a clean towel.            10.  Wear clean pajamas.            11.   Place clean sheets on your bed the night of your first shower and do not  sleep with pets. Day of Surgery : Do not apply any lotions/deodorants the morning of surgery.  Please wear clean clothes to the hospital/surgery center.  FAILURE TO FOLLOW THESE INSTRUCTIONS MAY RESULT IN THE CANCELLATION OF YOUR SURGERY  PATIENT SIGNATURE_________________________________  NURSE SIGNATURE__________________________________  ________________________________________________________________________   Adam Phenix  An incentive spirometer is a tool that can help keep your lungs clear and active. This tool measures how well you are filling your lungs with each breath. Taking long deep breaths may help reverse or decrease the chance of developing breathing (pulmonary) problems (especially infection) following: A long period of time when you are unable to move or be active. BEFORE THE PROCEDURE  If the spirometer includes an indicator to show your best effort, your nurse or respiratory therapist will set it to a desired goal. If possible, sit up straight or lean slightly forward. Try not to slouch. Hold the incentive spirometer in an upright position. INSTRUCTIONS FOR USE  Sit on the edge of your bed if possible, or sit up as far as you can in bed or on a chair. Hold the incentive spirometer in an upright position. Breathe out normally. Place the mouthpiece in your mouth and seal your lips tightly around it. Breathe in slowly and as deeply as possible, raising the piston or the ball toward the top of the column. Hold your breath for 3-5 seconds or for as long as possible. Allow the piston or ball to fall to the bottom of the column. Remove the mouthpiece from your mouth and breathe out normally. Rest for a few seconds and repeat Steps 1 through 7 at least 10 times every 1-2 hours when you are awake. Take your time and take a few normal breaths between deep breaths. The spirometer may include an  indicator to show your best effort. Use the indicator as a goal to work toward during each repetition. After each set of 10 deep breaths, practice coughing to be sure  your lungs are clear. If you have an incision (the cut made at the time of surgery), support your incision when coughing by placing a pillow or rolled up towels firmly against it. Once you are able to get out of bed, walk around indoors and cough well. You may stop using the incentive spirometer when instructed by your caregiver.  RISKS AND COMPLICATIONS Take your time so you do not get dizzy or light-headed. If you are in pain, you may need to take or ask for pain medication before doing incentive spirometry. It is harder to take a deep breath if you are having pain. AFTER USE Rest and breathe slowly and easily. It can be helpful to keep track of a log of your progress. Your caregiver can provide you with a simple table to help with this. If you are using the spirometer at home, follow these instructions: [place] IF:  You are having difficultly using the spirometer. You have trouble using the spirometer as often as instructed. Your pain medication is not giving enough relief while using the spirometer. You develop fever of 100.5 F (38.1 C) or higher. SEEK IMMEDIATE MEDICAL CARE IF:  You cough up bloody sputum that had not been present before. You develop fever of 102 F (38.9 C) or greater. You develop worsening pain at or near the incision site. MAKE SURE YOU:  Understand these instructions. Will watch your condition. Will get help right away if you are not doing well or get worse. Document Released: 09/05/2006 Document Revised: 07/18/2011 Document Reviewed: 11/06/2006 [employer] Patient Information 2014 [employer], [place].   ________________________________________________________________________  WHAT IS A BLOOD TRANSFUSION? Blood Transfusion Information  A transfusion is the replacement of blood or some of its  parts. Blood is made up of multiple cells which provide different functions. Red blood cells carry oxygen and are used for blood loss replacement. White blood cells fight against infection. Platelets control bleeding. Plasma helps clot blood. Other blood products are available for specialized needs, such as hemophilia or other clotting disorders. BEFORE THE TRANSFUSION  Who gives blood for transfusions?  Healthy volunteers who are fully evaluated to make sure their blood is safe. This is blood bank blood. Transfusion therapy is the safest it has ever been in the practice of medicine. Before blood is taken from a donor, a complete history is taken to make sure that person has no history of diseases nor engages in risky social behavior (examples are intravenous drug use or sexual activity with multiple partners). The donor's travel history is screened to minimize risk of transmitting infections, such as malaria. The donated blood is tested for signs of infectious diseases, such as HIV and hepatitis. The blood is then tested to be sure it is compatible with you in order to minimize the chance of a transfusion reaction. If you or a relative donates blood, this is often done in anticipation of surgery and is not appropriate for emergency situations. It takes many days to process the donated blood. RISKS AND COMPLICATIONS Although transfusion therapy is very safe and saves many lives, the main dangers of transfusion include:  Getting an infectious disease. Developing a transfusion reaction. This is an allergic reaction to something in the blood you were given. Every precaution is taken to prevent this. The decision to have a blood transfusion has been considered carefully by your caregiver before blood is given. Blood is not given unless the benefits outweigh the risks. AFTER THE TRANSFUSION Right after receiving a blood transfusion,  you will usually feel much better and more energetic. This is especially  true if your red blood cells have gotten low (anemic). The transfusion raises the level of the red blood cells which carry oxygen, and this usually causes an energy increase. The nurse administering the transfusion will monitor you carefully for complications. HOME CARE INSTRUCTIONS  No special instructions are needed after a transfusion. You may find your energy is better. Speak with your caregiver about any limitations on activity for underlying diseases you may have. SEEK MEDICAL CARE IF:  Your condition is not improving after your transfusion. You develop redness or irritation at the intravenous (IV) site. SEEK IMMEDIATE MEDICAL CARE IF:  Any of the following symptoms occur over the next 12 hours: Shaking chills. You have a temperature by mouth above 102 F (38.9 C), not controlled by medicine. Chest, back, or muscle pain. People around you feel you are not acting correctly or are confused. Shortness of breath or difficulty breathing. Dizziness and fainting. You get a rash or develop hives. You have a decrease in urine output. Your urine turns a dark color or changes to pink, red, or brown. Any of the following symptoms occur over the next 10 days: You have a temperature by mouth above 102 F (38.9 C), not controlled by medicine. Shortness of breath. Weakness after normal activity. The white part of the eye turns yellow (jaundice). You have a decrease in the amount of urine or are urinating less often. Your urine turns a dark color or changes to pink, red, or brown. Document Released: 04/22/2000 Document Revised: 07/18/2011 Document Reviewed: 12/10/2007 [facility] Patient Information 2014 [place], [place].  _______________________________________________________________________

## 2021-01-26 ENCOUNTER — Other Ambulatory Visit: Payer: Self-pay | Admitting: Surgery

## 2021-01-27 LAB — SARS CORONAVIRUS 2 (TAT 6-24 HRS): SARS Coronavirus 2: NEGATIVE

## 2021-01-28 ENCOUNTER — Encounter (HOSPITAL_COMMUNITY): Payer: Self-pay | Admitting: Surgery

## 2021-01-28 ENCOUNTER — Ambulatory Visit (HOSPITAL_COMMUNITY)
Admission: RE | Admit: 2021-01-28 | Discharge: 2021-01-28 | Disposition: A | Discharge status: Home / Self Care | Payer: Medicare PPO | Attending: Surgery | Admitting: Surgery

## 2021-01-28 ENCOUNTER — Ambulatory Visit (HOSPITAL_COMMUNITY): Payer: Medicare PPO | Admitting: Anesthesiology

## 2021-01-28 ENCOUNTER — Encounter (HOSPITAL_COMMUNITY)
Admission: RE | Disposition: A | Discharge status: Home / Self Care | Payer: Self-pay | Source: Home / Self Care | Attending: Surgery

## 2021-01-28 DIAGNOSIS — D121 Benign neoplasm of appendix: Secondary | ICD-10-CM | POA: Diagnosis not present

## 2021-01-28 DIAGNOSIS — K219 Gastro-esophageal reflux disease without esophagitis: Secondary | ICD-10-CM | POA: Insufficient documentation

## 2021-01-28 DIAGNOSIS — Z8379 Family history of other diseases of the digestive system: Secondary | ICD-10-CM | POA: Diagnosis not present

## 2021-01-28 DIAGNOSIS — E039 Hypothyroidism, unspecified: Secondary | ICD-10-CM | POA: Diagnosis not present

## 2021-01-28 DIAGNOSIS — Z8371 Family history of colonic polyps: Secondary | ICD-10-CM | POA: Diagnosis not present

## 2021-01-28 DIAGNOSIS — Z888 Allergy status to other drugs, medicaments and biological substances status: Secondary | ICD-10-CM | POA: Diagnosis not present

## 2021-01-28 DIAGNOSIS — Z87891 Personal history of nicotine dependence: Secondary | ICD-10-CM | POA: Insufficient documentation

## 2021-01-28 DIAGNOSIS — I10 Essential (primary) hypertension: Secondary | ICD-10-CM | POA: Insufficient documentation

## 2021-01-28 DIAGNOSIS — K388 Other specified diseases of appendix: Secondary | ICD-10-CM | POA: Diagnosis not present

## 2021-01-28 DIAGNOSIS — M3500 Sicca syndrome, unspecified: Secondary | ICD-10-CM | POA: Insufficient documentation

## 2021-01-28 DIAGNOSIS — F418 Other specified anxiety disorders: Secondary | ICD-10-CM | POA: Diagnosis not present

## 2021-01-28 DIAGNOSIS — D638 Anemia in other chronic diseases classified elsewhere: Secondary | ICD-10-CM | POA: Diagnosis not present

## 2021-01-28 DIAGNOSIS — M797 Fibromyalgia: Secondary | ICD-10-CM | POA: Diagnosis not present

## 2021-01-28 HISTORY — PX: LAPAROSCOPIC APPENDECTOMY: SHX408

## 2021-01-28 LAB — TYPE AND SCREEN
ABO/RH(D): B NEG
Antibody Screen: NEGATIVE

## 2021-01-28 SURGERY — APPENDECTOMY, LAPAROSCOPIC
Anesthesia: General | Site: Abdomen

## 2021-01-28 MED ORDER — OXYCODONE HCL 5 MG/5ML PO SOLN
5.0000 mg | Freq: Once | ORAL | Status: DC | PRN
Start: 2021-01-28 — End: 2021-01-28

## 2021-01-28 MED ORDER — LACTATED RINGERS IV SOLN
INTRAVENOUS | Status: DC | PRN
  Administered 2021-01-28: 1000 mL

## 2021-01-28 MED ORDER — PROPOFOL 10 MG/ML IV BOLUS
INTRAVENOUS | Status: DC | PRN
  Administered 2021-01-28: 150 mg via INTRAVENOUS

## 2021-01-28 MED ORDER — FENTANYL CITRATE PF 50 MCG/ML IJ SOSY
25.0000 ug | PREFILLED_SYRINGE | INTRAMUSCULAR | Status: DC | PRN
  Administered 2021-01-28: 50 ug via INTRAVENOUS

## 2021-01-28 MED ORDER — POLYETHYLENE GLYCOL 3350 17 GM/SCOOP PO POWD: 1.0000 | Freq: Once | ORAL | Status: DC

## 2021-01-28 MED ORDER — BUPIVACAINE-EPINEPHRINE (PF) 0.25% -1:200000 IJ SOLN
INTRAMUSCULAR | Status: AC
  Filled 2021-01-28: qty 30

## 2021-01-28 MED ORDER — ONDANSETRON HCL 4 MG/2ML IJ SOLN
4.0000 mg | Freq: Once | INTRAMUSCULAR | Status: AC | PRN
  Administered 2021-01-28: 4 mg via INTRAVENOUS

## 2021-01-28 MED ORDER — FENTANYL CITRATE PF 50 MCG/ML IJ SOSY
PREFILLED_SYRINGE | INTRAMUSCULAR | Status: AC
  Filled 2021-01-28: qty 1

## 2021-01-28 MED ORDER — SUGAMMADEX SODIUM 200 MG/2ML IV SOLN
INTRAVENOUS | Status: DC | PRN
  Administered 2021-01-28: 200 mg via INTRAVENOUS

## 2021-01-28 MED ORDER — METRONIDAZOLE 500 MG PO TABS: 1000.0000 mg | ORAL_TABLET | ORAL | Status: DC

## 2021-01-28 MED ORDER — BISACODYL 5 MG PO TBEC: 20.0000 mg | DELAYED_RELEASE_TABLET | Freq: Once | ORAL | Status: DC

## 2021-01-28 MED ORDER — NEOMYCIN SULFATE 500 MG PO TABS: 1000.0000 mg | ORAL_TABLET | ORAL | Status: DC

## 2021-01-28 MED ORDER — BUPIVACAINE LIPOSOME 1.3 % IJ SUSP: 20.0000 mL | Freq: Once | INTRAMUSCULAR | Status: DC

## 2021-01-28 MED ORDER — ROCURONIUM BROMIDE 10 MG/ML (PF) SYRINGE
PREFILLED_SYRINGE | INTRAVENOUS | Status: DC | PRN
  Administered 2021-01-28: 60 mg via INTRAVENOUS

## 2021-01-28 MED ORDER — FENTANYL CITRATE PF 50 MCG/ML IJ SOSY
PREFILLED_SYRINGE | INTRAMUSCULAR | Status: AC
  Administered 2021-01-28: 50 ug via INTRAVENOUS
  Filled 2021-01-28: qty 1

## 2021-01-28 MED ORDER — ROCURONIUM BROMIDE 10 MG/ML (PF) SYRINGE
PREFILLED_SYRINGE | INTRAVENOUS | Status: AC
  Filled 2021-01-28: qty 10

## 2021-01-28 MED ORDER — ALVIMOPAN 12 MG PO CAPS
12.0000 mg | ORAL_CAPSULE | ORAL | Status: AC
  Administered 2021-01-28: 12 mg via ORAL
  Filled 2021-01-28: qty 1

## 2021-01-28 MED ORDER — ENSURE PRE-SURGERY PO LIQD: 592.0000 mL | Freq: Once | ORAL | Status: DC

## 2021-01-28 MED ORDER — DEXAMETHASONE SODIUM PHOSPHATE 10 MG/ML IJ SOLN
INTRAMUSCULAR | Status: AC
  Filled 2021-01-28: qty 1

## 2021-01-28 MED ORDER — LIDOCAINE 2% (20 MG/ML) 5 ML SYRINGE
INTRAMUSCULAR | Status: DC | PRN
  Administered 2021-01-28: 60 mg via INTRAVENOUS

## 2021-01-28 MED ORDER — ACETAMINOPHEN 500 MG PO TABS
1000.0000 mg | ORAL_TABLET | ORAL | Status: AC
  Administered 2021-01-28: 1000 mg via ORAL
  Filled 2021-01-28: qty 2

## 2021-01-28 MED ORDER — ORAL CARE MOUTH RINSE: 15.0000 mL | Freq: Once | OROMUCOSAL | Status: AC

## 2021-01-28 MED ORDER — CHLORHEXIDINE GLUCONATE 0.12 % MT SOLN
15.0000 mL | Freq: Once | OROMUCOSAL | Status: AC
  Administered 2021-01-28: 15 mL via OROMUCOSAL

## 2021-01-28 MED ORDER — TRAMADOL HCL 50 MG PO TABS: 50.0000 mg | ORAL_TABLET | Freq: Four times a day (QID) | ORAL | 0 refills | Status: AC | PRN

## 2021-01-28 MED ORDER — ONDANSETRON HCL 4 MG/2ML IJ SOLN
INTRAMUSCULAR | Status: AC
  Filled 2021-01-28: qty 2

## 2021-01-28 MED ORDER — MEPERIDINE HCL 50 MG/ML IJ SOLN: 6.2500 mg | INTRAMUSCULAR | Status: DC | PRN

## 2021-01-28 MED ORDER — DEXAMETHASONE SODIUM PHOSPHATE 4 MG/ML IJ SOLN
INTRAMUSCULAR | Status: DC | PRN
  Administered 2021-01-28: 1 mg via INTRAVENOUS

## 2021-01-28 MED ORDER — FENTANYL CITRATE (PF) 100 MCG/2ML IJ SOLN
INTRAMUSCULAR | Status: DC | PRN
  Administered 2021-01-28: 50 ug via INTRAVENOUS
  Administered 2021-01-28 (×2): 100 ug via INTRAVENOUS

## 2021-01-28 MED ORDER — PHENYLEPHRINE 40 MCG/ML (10ML) SYRINGE FOR IV PUSH (FOR BLOOD PRESSURE SUPPORT)
PREFILLED_SYRINGE | INTRAVENOUS | Status: DC | PRN
  Administered 2021-01-28: 80 ug via INTRAVENOUS

## 2021-01-28 MED ORDER — LABETALOL HCL 5 MG/ML IV SOLN
INTRAVENOUS | Status: DC | PRN
  Administered 2021-01-28 (×2): 5 mg via INTRAVENOUS

## 2021-01-28 MED ORDER — LIDOCAINE HCL (PF) 2 % IJ SOLN
INTRAMUSCULAR | Status: AC
  Filled 2021-01-28: qty 5

## 2021-01-28 MED ORDER — ENSURE PRE-SURGERY PO LIQD: 296.0000 mL | Freq: Once | ORAL | Status: DC

## 2021-01-28 MED ORDER — CHLORHEXIDINE GLUCONATE CLOTH 2 % EX PADS: 6.0000 | MEDICATED_PAD | Freq: Once | CUTANEOUS | Status: DC

## 2021-01-28 MED ORDER — MIDAZOLAM HCL 5 MG/5ML IJ SOLN
INTRAMUSCULAR | Status: DC | PRN
  Administered 2021-01-28: 2 mg via INTRAVENOUS

## 2021-01-28 MED ORDER — MIDAZOLAM HCL 2 MG/2ML IJ SOLN
INTRAMUSCULAR | Status: AC
  Filled 2021-01-28: qty 2

## 2021-01-28 MED ORDER — OXYCODONE HCL 5 MG PO TABS: 5.0000 mg | ORAL_TABLET | Freq: Once | ORAL | Status: DC | PRN

## 2021-01-28 MED ORDER — ONDANSETRON HCL 4 MG/2ML IJ SOLN
INTRAMUSCULAR | Status: DC | PRN
  Administered 2021-01-28: 4 mg via INTRAVENOUS

## 2021-01-28 MED ORDER — BUPIVACAINE-EPINEPHRINE (PF) 0.25% -1:200000 IJ SOLN
INTRAMUSCULAR | Status: DC | PRN
  Administered 2021-01-28: 5 mL

## 2021-01-28 MED ORDER — SODIUM CHLORIDE 0.9 % IV SOLN
2.0000 g | INTRAVENOUS | Status: AC
  Administered 2021-01-28: 2 g via INTRAVENOUS
  Filled 2021-01-28: qty 2

## 2021-01-28 MED ORDER — HEPARIN SODIUM (PORCINE) 5000 UNIT/ML IJ SOLN
5000.0000 [IU] | Freq: Once | INTRAMUSCULAR | Status: AC
Start: 2021-01-28 — End: 2021-01-28
  Administered 2021-01-28: 5000 [IU] via SUBCUTANEOUS
  Filled 2021-01-28: qty 1

## 2021-01-28 MED ORDER — LACTATED RINGERS IV SOLN: INTRAVENOUS | Status: DC

## 2021-01-28 MED ORDER — ACETAMINOPHEN 160 MG/5ML PO SOLN: 325.0000 mg | ORAL | Status: DC | PRN

## 2021-01-28 MED ORDER — 0.9 % SODIUM CHLORIDE (POUR BTL) OPTIME
TOPICAL | Status: DC | PRN
  Administered 2021-01-28: 1000 mL

## 2021-01-28 MED ORDER — ACETAMINOPHEN 325 MG PO TABS: 325.0000 mg | ORAL_TABLET | ORAL | Status: DC | PRN

## 2021-01-28 MED ORDER — FENTANYL CITRATE (PF) 250 MCG/5ML IJ SOLN
INTRAMUSCULAR | Status: AC
  Filled 2021-01-28: qty 5

## 2021-01-28 SURGICAL SUPPLY — 51 items
ADH SKN CLS APL DERMABOND .7 (GAUZE/BANDAGES/DRESSINGS) ×1
APL PRP STRL LF DISP 70% ISPRP (MISCELLANEOUS) ×1
APPLIER CLIP 5 13 M/L LIGAMAX5 (MISCELLANEOUS)
APPLIER CLIP ROT 10 11.4 M/L (STAPLE)
APR CLP MED LRG 11.4X10 (STAPLE)
APR CLP MED LRG 5 ANG JAW (MISCELLANEOUS)
BAG COUNTER SPONGE SURGICOUNT (BAG) ×2 IMPLANT
BAG SPEC RTRVL LRG 6X4 10 (ENDOMECHANICALS) ×1
BAG SPNG CNTER NS LX DISP (BAG) ×1
CABLE HIGH FREQUENCY MONO STRZ (ELECTRODE) IMPLANT
CHLORAPREP W/TINT 26 (MISCELLANEOUS) ×2 IMPLANT
CLIP APPLIE 5 13 M/L LIGAMAX5 (MISCELLANEOUS) IMPLANT
CLIP APPLIE ROT 10 11.4 M/L (STAPLE) IMPLANT
COVER SURGICAL LIGHT HANDLE (MISCELLANEOUS) ×2 IMPLANT
CUTTER FLEX LINEAR 45M (STAPLE) ×2 IMPLANT
DECANTER SPIKE VIAL GLASS SM (MISCELLANEOUS) ×2 IMPLANT
DERMABOND ADVANCED (GAUZE/BANDAGES/DRESSINGS) ×1
DERMABOND ADVANCED .7 DNX12 (GAUZE/BANDAGES/DRESSINGS) ×1 IMPLANT
DRAIN CHANNEL 19F RND (DRAIN) IMPLANT
DRAPE SHEET LG 3/4 BI-LAMINATE (DRAPES) ×1 IMPLANT
ELECT PENCIL ROCKER SW 15FT (MISCELLANEOUS) ×2 IMPLANT
ELECT REM PT RETURN 15FT ADLT (MISCELLANEOUS) ×2 IMPLANT
ENDOLOOP SUT PDS II  0 18 (SUTURE)
ENDOLOOP SUT PDS II 0 18 (SUTURE) IMPLANT
EVACUATOR SILICONE 100CC (DRAIN) IMPLANT
GLOVE SURG ENC MOIS LTX SZ7.5 (GLOVE) ×2 IMPLANT
GLOVE SURG UNDER LTX SZ8 (GLOVE) ×2 IMPLANT
GOWN STRL REUS W/TWL XL LVL3 (GOWN DISPOSABLE) ×4 IMPLANT
IRRIG SUCT STRYKERFLOW 2 WTIP (MISCELLANEOUS) ×2
IRRIGATION SUCT STRKRFLW 2 WTP (MISCELLANEOUS) ×1 IMPLANT
KIT BASIN OR (CUSTOM PROCEDURE TRAY) ×2 IMPLANT
KIT TURNOVER KIT A (KITS) ×2 IMPLANT
PAD POSITIONING PINK XL (MISCELLANEOUS) ×2 IMPLANT
POUCH SPECIMEN RETRIEVAL 10MM (ENDOMECHANICALS) ×2 IMPLANT
RELOAD 45 VASCULAR/THIN (ENDOMECHANICALS) IMPLANT
RELOAD STAPLE 45 2.5 WHT GRN (ENDOMECHANICALS) IMPLANT
RELOAD STAPLE 45 3.5 BLU ETS (ENDOMECHANICALS) IMPLANT
RELOAD STAPLE TA45 3.5 REG BLU (ENDOMECHANICALS) ×2 IMPLANT
SCISSORS LAP 5X35 DISP (ENDOMECHANICALS) IMPLANT
SET TUBE SMOKE EVAC HIGH FLOW (TUBING) ×2 IMPLANT
SHEARS HARMONIC ACE PLUS 36CM (ENDOMECHANICALS) ×2 IMPLANT
SLEEVE ADV FIXATION 5X100MM (TROCAR) ×2 IMPLANT
SUT ETHILON 3 0 PS 1 (SUTURE) IMPLANT
SUT MNCRL AB 4-0 PS2 18 (SUTURE) ×2 IMPLANT
TOWEL OR 17X26 10 PK STRL BLUE (TOWEL DISPOSABLE) IMPLANT
TOWEL OR NON WOVEN STRL DISP B (DISPOSABLE) ×2 IMPLANT
TRAY FOLEY MTR SLVR 14FR STAT (SET/KITS/TRAYS/PACK) ×2 IMPLANT
TRAY FOLEY MTR SLVR 16FR STAT (SET/KITS/TRAYS/PACK) ×2 IMPLANT
TRAY LAPAROSCOPIC (CUSTOM PROCEDURE TRAY) ×2 IMPLANT
TROCAR ADV FIXATION 5X100MM (TROCAR) ×2 IMPLANT
TROCAR XCEL BLUNT TIP 100MML (ENDOMECHANICALS) ×2 IMPLANT

## 2021-01-28 NOTE — H&P (Signed)
CC: here today for surgery  Jennifer Franco is a 65 y.o. female with history of HTN, hypothyroidism, GERD, Sjogren, fibromyalgia, prior diverticulitis, whom is seen in the office today as a referral by Dr. Cristina Franco for evaluation of appendiceal polypoid tissue.  She is well known to [place] from her robotic LAR for diverticular stricture that she underwent 08/01/19.   Previously has been unable to have colonoscopy completed due to her diverticular stricture but since having had her surgery colonoscopy with Dr. Cristina Franco 09/09/20. Found to have: 1. Healthy-appearing colorectal anastomosis at 10 cm 2. 10 mm polyp in the cecum that was lifted and removed -tubular adenoma 3. Granular mucosa protruding slightly at appendiceal orifice that was biopsied -sessile serrated adenoma/polyp 4. 4 mm polyp in the proximal ascending colon that was excised with forceps -sessile serrated adenoma 5. 8 mm polyp in the transverse colon that was removed -tubular adenoma 6. 3 mm polyp in the transverse colon was removed -tubular adenoma 7. Sessile polyp was thought to be seen but subsequently on careful inspection was not apparent at all in her colon at hepatic flexure.  She denies any complaints. Overall she has been doing quite well she is having her sigmoid colon removed. She has regular bowel movements. She denies nausea or vomiting. She denies blood in her stool. She has had some expected weight gain.  PMH: HTN, hypothyroidism (well controlled on synthroid), GERD (partially controlled with PPI), Sjogren syndrome, fibromyalgia  PSH: Robotic LAR cysto/stents, flex sig; incidental bladder tumor removed by Dr. Jeffie Franco.   FHx: Denies any known family history of colorectal, breast, endometrial or ovarian cancer  Social Hx: Denies use of tobacco/EtOH/illicit drug. Her father was a Education officer, environmental whom passed away 08-17-06.  Past Medical History:  Diagnosis Date   Anemia    Anginal pain (HCC)    anxiety   Anxiety     Cancer (HCC)    bladder   Chills    CTS (carpal tunnel syndrome)    LEFT   Cyclical vomiting    Depression    Diarrhea, unspecified    Diverticulitis    Fibromyalgia    Hemorrhoids    History of adenomatous polyp of colon    History of morbid obesity    Hypothyroid    Hypothyroidism    Intractable nausea and vomiting    Irregular bowel habits    Joint pain    LLQ abdominal pain    Morbid obesity (HCC)    Nausea and vomiting    Onychomycosis    RA (rheumatoid arthritis) (HCC)    Reflux esophagitis    Reflux esophagitis    Sjogren's syndrome (HCC)    Tinea corporis    Unspecified mood (affective) disorder (HCC)    Weight loss     Past Surgical History:  Procedure Laterality Date   ADENOIDECTOMY     CARPAL TUNNEL RELEASE Right    CESAREAN SECTION     CESAREAN SECTION     CYSTOSCOPY WITH STENT PLACEMENT N/A 08/01/2019   Procedure: CYSTOSCOPY WITH URETHRAL DILATION, BILATERAL RETROGRADE PYELOGRAMS, TRANSURETHRAL RESECTION OF SMALL BLADDER TUMOR, INSERTION OF BILATERAL OPEN ENDED CATHETERS WITH BILATERAL INJECTION OF FIREFLY;  Surgeon: Jennifer Seal, MD;  Location: [facility];  Service: Urology;  Laterality: N/A;   FLEXIBLE SIGMOIDOSCOPY N/A 08/01/2019   Procedure: FLEXIBLE SIGMOIDOSCOPY;  Surgeon: Jennifer Roup, MD;  Location: [facility];  Service: General;  Laterality: N/A;   wisdom teeth      Family History  Problem Relation Age of Onset   Glaucoma Mother    GER disease Mother    Colon polyps Mother    Breast cancer Other 17       Maternal Great Grandmother   Osteoarthritis Father    Alzheimer's disease Father    Colon polyps Maternal Grandmother    Colon polyps Maternal Grandfather     Social:  reports that she has quit smoking. She has never used smokeless tobacco. She reports current alcohol use. She reports that she does not use drugs.  Allergies:  Allergies  Allergen Reactions   Etodolac Diarrhea   Topamax [Topiramate] Other (See Comments)    Brain  flutters     Medications: I have reviewed the patient's current medications.  No results found for this or any previous visit (from the past 48 hour(s)).  No results found.  ROS - all of the below systems have been reviewed with the patient and positives are indicated with bold text General: chills, fever or night sweats Eyes: blurry vision or double vision ENT: epistaxis or sore throat Allergy/Immunology: itchy/watery eyes or nasal congestion Hematologic/Lymphatic: bleeding problems, blood clots or swollen lymph nodes Endocrine: temperature intolerance or unexpected weight changes Breast: new or changing breast lumps or nipple discharge Resp: cough, shortness of breath, or wheezing CV: chest pain or dyspnea on exertion GI: as per HPI GU: dysuria, trouble voiding, or hematuria MSK: joint pain or joint stiffness Neuro: TIA or stroke symptoms Derm: pruritus and skin lesion changes Psych: anxiety and depression  PE Blood pressure 139/71, pulse 71, temperature 97.9 F (36.6 C), temperature source Oral, resp. rate 16, height 5\' 3"  (1.6 m), weight 74.7 kg, SpO2 98 %. Constitutional: NAD; conversant Eyes: Moist conjunctiva; no lid lag Neck: Trachea midline Lungs: Normal respiratory effort CV: RRR GI: Abd soft, NT/ND MSK: Normal range of motion of extremities Psychiatric: Appropriate affect; alert and oriented x3  No results found for this or any previous visit (from the past 48 hour(s)).  No results found.   A/P: Jennifer Franco is a very pleasant 65 y.o. female with hx of HTN, hypothyroidism, GERD, Sjogren, fibromyalgia, prior diverticulitis, here for surgery regarding appendiceal orifice sessile serrated adenoma  -The anatomy and physiology of the GI tract was reviewed with the patient. The pathophysiology of colon polyps was discussed as well with associated pictures. -We have discussed various different treatment options going forward including surgery (the most  definitive) to address this - laparoscopic appendectomy, possible ileocecectomy based on intraoperative findings if unable wedge appendix and associated lesion out. -The planned procedure, material risks (including, but not limited to, pain, bleeding, infection, scarring, need for blood transfusion, damage to surrounding structures- blood vessels/nerves/viscus/organs, damage to ureter, urine leak, leak from anastomosis, need for additional procedures, scenarios where a stoma may be necessary and where it may be permanent, worsening of pre-existing medical conditions, hernia, recurrence, pneumonia, heart attack, stroke, death) benefits and alternatives to surgery were discussed at length. The patient's questions were answered to her satisfaction, she voiced understanding and elected to proceed with surgery. Additionally, we discussed typical postoperative expectations and the recovery process.  Nadeen Landau, MD [facility] Use [email or web address] to contact on call provider

## 2021-01-28 NOTE — Transfer of Care (Signed)
Immediate Anesthesia Transfer of Care Note  Patient: Jennifer Franco  Procedure(s) Performed: APPENDECTOMY LAPAROSCOPIC (Abdomen)  Patient Location: PACU  Anesthesia Type:General  Level of Consciousness: drowsy  Airway & Oxygen Therapy: Patient Spontanous Breathing and Patient connected to face mask oxygen  Post-op Assessment: Report given to RN and Post -op Vital signs reviewed and stable  Post vital signs: Reviewed and stable  Last Vitals:  Vitals Value Taken Time  BP 163/89 01/28/21 1145  Temp    Pulse 67 01/28/21 1148  Resp 20 01/28/21 1148  SpO2 100 % 01/28/21 1148  Vitals shown include unvalidated device data.  Last Pain:  Vitals:   01/28/21 0843  TempSrc:   PainSc: 5       Patients Stated Pain Goal: 4 (49/17/91 5056)  Complications: No notable events documented.

## 2021-01-28 NOTE — Anesthesia Procedure Notes (Signed)
Procedure Name: Intubation Date/Time: 01/28/2021 10:11 AM Performed by: Lieutenant Diego, CRNA Pre-anesthesia Checklist: Patient identified, Emergency Drugs available, Suction available and Patient being monitored Patient Re-evaluated:Patient Re-evaluated prior to induction Oxygen Delivery Method: Circle system utilized Preoxygenation: Pre-oxygenation with 100% oxygen Induction Type: IV induction Ventilation: Mask ventilation without difficulty Laryngoscope Size: Miller and 2 Tube type: Oral Tube size: 7.0 mm Number of attempts: 1 Airway Equipment and Method: Stylet Placement Confirmation: ETT inserted through vocal cords under direct vision, positive ETCO2 and breath sounds checked- equal and bilateral Secured at: 23 cm Tube secured with: Tape Dental Injury: Teeth and Oropharynx as per pre-operative assessment

## 2021-01-28 NOTE — Anesthesia Preprocedure Evaluation (Signed)
Anesthesia Evaluation  Patient identified by MRN, date of birth, ID band Patient awake    Reviewed: Allergy & Precautions, NPO status , Patient's Chart, lab work & pertinent test results  Airway Mallampati: II  TM Distance: >3 FB Neck ROM: Full    Dental no notable dental hx.    Pulmonary neg pulmonary ROS, former smoker,    Pulmonary exam normal breath sounds clear to auscultation       Cardiovascular negative cardio ROS Normal cardiovascular exam Rhythm:Regular Rate:Normal     Neuro/Psych PSYCHIATRIC DISORDERS Anxiety Depression negative neurological ROS     GI/Hepatic Neg liver ROS, GERD  ,  Endo/Other  Hypothyroidism Morbid obesity  Renal/GU negative Renal ROS  negative genitourinary   Musculoskeletal  (+) Arthritis , Osteoarthritis,  Fibromyalgia -  Abdominal   Peds negative pediatric ROS (+)  Hematology  (+) Blood dyscrasia, anemia ,   Anesthesia Other Findings   Reproductive/Obstetrics negative OB ROS                             Anesthesia Physical  Anesthesia Plan  ASA: 3  Anesthesia Plan: General   Post-op Pain Management:    Induction: Intravenous  PONV Risk Score and Plan: 3 and Ondansetron, Dexamethasone, Midazolam and Treatment may vary due to age or medical condition  Airway Management Planned: Oral ETT  Additional Equipment: None  Intra-op Plan:   Post-operative Plan: Extubation in OR  Informed Consent: I have reviewed the patients History and Physical, chart, labs and discussed the procedure including the risks, benefits and alternatives for the proposed anesthesia with the patient or authorized representative who has indicated his/her understanding and acceptance.     Dental advisory given  Plan Discussed with: CRNA and Anesthesiologist  Anesthesia Plan Comments:         Anesthesia Quick Evaluation

## 2021-01-28 NOTE — Discharge Instructions (Addendum)
POST OP INSTRUCTIONS AFTER COLON SURGERY  DIET: Be sure to include lots of fluids daily to stay hydrated - 64oz of water per day (8, 8 oz glasses).  Avoid fast food or heavy meals for the first couple of weeks as your are more likely to get nauseated. Avoid raw/uncooked fruits or vegetables for the first 4 weeks (its ok to have these if they are blended into smoothie form). If you have fruits/vegetables, make sure they are cooked until soft enough to mash on the roof of your mouth and chew your food well. Otherwise, diet as tolerated.  Take your usually prescribed home medications unless otherwise directed.  PAIN CONTROL: Pain is best controlled by a usual combination of three different methods TOGETHER: Ice/Heat Over the counter pain medication Prescription pain medication Most patients will experience some swelling and bruising around the surgical site.  Ice packs or heating pads (30-60 minutes up to 6 times a day) will help. Some people prefer to use ice alone, heat alone, alternating between ice & heat.  Experiment to what works for you.  Swelling and bruising can take several weeks to resolve.   It is helpful to take an over-the-counter pain medication regularly for the first few weeks: Ibuprofen (Motrin/Advil) - 200mg tabs - take 3 tabs (600mg) every 6 hours as needed for pain (unless you have been directed previously to avoid NSAIDs/ibuprofen) Acetaminophen (Tylenol) - you may take 650mg every 6 hours as needed. You can take this with motrin as they act differently on the body. If you are taking a narcotic pain medication that has acetaminophen in it, do not take over the counter tylenol at the same time. NOTE: You may take both of these medications together - most patients  find it most helpful when alternating between the two (i.e. Ibuprofen at 6am, tylenol at 9am, ibuprofen at 12pm ...) A  prescription for pain medication should be given to you upon discharge.  Take your pain medication as  prescribed if your pain is not adequatly controlled with the over-the-counter pain reliefs mentioned above.  Avoid getting constipated.  Between the surgery and the pain medications, it is common to experience some constipation.  Increasing fluid intake and taking a fiber supplement (such as Metamucil, Citrucel, FiberCon, MiraLax, etc) 1-2 times a day regularly will usually help prevent this problem from occurring.  A mild laxative (prune juice, Milk of Magnesia, MiraLax, etc) should be taken according to package directions if there are no bowel movements after 48 hours.    Dressing: Your incisions are covered in Dermabond which is like sterile superglue for the skin. This will come off on it's own in a couple weeks. It is waterproof and you may bathe normally starting the day after your surgery in a shower. Avoid baths/pools/lakes/oceans until your wounds have fully healed.  ACTIVITIES as tolerated:   Avoid heavy lifting (>10lbs or 1 gallon of milk) for the next 6 weeks. You may resume regular daily activities as tolerated--such as daily self-care, walking, climbing stairs--gradually increasing activities as tolerated.  If you can walk 30 minutes without difficulty, it is safe to try more intense activity such as jogging, treadmill, bicycling, low-impact aerobics.  DO NOT PUSH THROUGH PAIN.  Let pain be your guide: If it hurts to do something, don't do it. You may drive when you are no longer taking prescription pain medication, you can comfortably wear a seatbelt, and you can safely maneuver your car and apply brakes.  FOLLOW UP in our   office Please call [facility] at [phone] to set up an appointment to see your surgeon in the office for a follow-up appointment approximately 2 weeks after your surgery. Make sure that you call for this appointment the day you arrive home to insure a convenient appointment time.  9. If you have disability or family leave forms that need to be completed, you may have  them completed by your primary care physician's office; for return to work instructions, please ask our office staff and they will be happy to assist you in obtaining this documentation   When to call us [phone]: Poor pain control Reactions / problems with new medications (rash/itching, etc)  Fever over 101.5 F (38.5 C) Inability to urinate Nausea/vomiting Worsening swelling or bruising Continued bleeding from incision. Increased pain, redness, or drainage from the incision  The clinic staff is available to answer your questions during regular business hours (8:30am-5pm).  Please don't hesitate to call and ask to speak to one of our nurses for clinical concerns.   A surgeon from [facility] is always on call at the hospitals   If you have a medical emergency, go to the nearest emergency room or call 911.  [facility] [address], [address], [place], [place]  [address] MAIN: [phone] FAX: [phone] [email or web address]  

## 2021-01-28 NOTE — Op Note (Signed)
ASIAH BROWDER [id number]   PRE-OPERATIVE DIAGNOSIS:  Appendiceal orifice polyp  POST-OPERATIVE DIAGNOSIS:  Same  PROCEDURE: Laparoscopic appendectomy  SURGEON:  Sharon Mt. Dema Severin, M.D.  ASSISTANT: Drucie Ip, MD PGY-5  ANESTHESIA: General endotracheal  EBL:   5 mL  DRAINS: None  SPECIMEN:  Appendix with cuff of cecum in situ  COUNTS:  Sponge, needle and instrument counts were reported correct x2 at conclusion of the operation  DISPOSITION:  PACU in satisfactory condition  COMPLICATIONS: None  FINDINGS: No significant adhesions in her abdomen. Normal-appearing appendix.  Specimen was opened on the back table and grossly found to contain the entire polyp within the margins of resection.  This is a somewhat flat/carpeting polyp that is soft.  It is approximately 1 x 1 cm in size.  DESCRIPTION:   The patient was identified & brought into the operating room. SCDs were in place and functioning. General endotracheal anesthesia was administered. Preoperative antibiotics were administered. The patient was positioned supine with left arm tucked. Hair on the abdomen was then clipped by the OR team. A foley catheter was inserted under sterile conditions. The abdomen was prepped and draped in the standard sterile fashion. A surgical timeout confirmed our plan.  A small incision was made in the infraumbilical skin. The subcutaneous tissue was dissected and the umbilical stalk identified. The stalk was grasped with a Kocher and retracted outwardly. The infraumbilical fascia was exposed and incised. Peritoneal entry was carefully made bluntly. A 0 Vicryl purse-string suture was placed and then the [employer] port was introduced into the abdomen.  CO2 insufflation commenced to 72mmHg. The laparoscope was inserted and confirmed no evidence of trocar site complications. She was then positioned in Trendelenburg. Two additional ports were placed - one in left lower quadrant and another in the  suprapubic midline taking care to stay well above the bladder - 3 fingerbreadths above the pubic symphysis. The bed was then slightly tilted to place the left side down.  The appendix is readily identified in the right lower quadrant.  It is normal in appearance.  There is no evident mass involving the external wall of the appendix.  The terminal ileum was identified medially.  The appendix was grasped and elevated anteriorly.  The mesoappendix is identified and location of the terminal ileum confirmed to be away from this.  This is then ligated away from both the cecum and the terminal ileum using the harmonic scalpel.  The appendiceal artery is seen and found to be hemostatic after being ligated.  A window was then carefully developed at the base of the appendix and the intervening appendiceal mesentery was ligated using the harmonic scalpel.  The planned side division was identified including a small cuff of cecum with the appendix to ensure a negative margin.  The ileocecal valve was identified and away from this planned site of transection.  A laparoscopic 45 mm GIA stapler was then used to transect the appendix including this healthy cuff of cecum with it.  The staple line is inspected and noted to be intact.  The mesoappendix was inspected and noted to be hemostatic. The appendix was placed in an EndoBag.  The appendix is opened on the back table and found to contain the lesion within the body of the appendix extending toward the appendiceal orifice.  This is a soft carpeting polyp that is approximately 1 x 1 cm in size.  Grossly, the margins are not involved. This was then passed off as specimen. I then scrubbed  back in.  The right lower quadrant was conservatively irrigated.  There is a small oozing spot on the staple line that is controlled with a single 5 mm clip.  The area is all then reirrigated and hemostasis verified. Staple line was noted to be intact on the cecum with no bleeding. There was  no perforation or injury.   The left lower quadrant and suprapubic ports were removed under direct visualization. The CO2 was exhausted from the abdomen. The umbilical fascia was then closed by closing the 0 Vicryl suture. The fascia was palpated and noted to be completely closed. The skin of all port sites was then approximated using 4-0 Monocryl suture. The incisions were covered with Dermabond.  She was then awakened from general anesthesia, extubated, and transferred to a stretcher for transport to recover in satisfactory condition.

## 2021-01-29 ENCOUNTER — Other Ambulatory Visit (HOSPITAL_COMMUNITY): Payer: Self-pay

## 2021-01-29 ENCOUNTER — Encounter (HOSPITAL_COMMUNITY): Payer: Self-pay | Admitting: Surgery

## 2021-01-29 LAB — SURGICAL PATHOLOGY

## 2021-02-01 ENCOUNTER — Other Ambulatory Visit (HOSPITAL_COMMUNITY): Payer: Self-pay

## 2021-02-04 DIAGNOSIS — R197 Diarrhea, unspecified: Secondary | ICD-10-CM | POA: Diagnosis not present

## 2021-02-06 NOTE — Anesthesia Postprocedure Evaluation (Signed)
Anesthesia Post Note  Patient: Jennifer Franco  Procedure(s) Performed: APPENDECTOMY LAPAROSCOPIC (Abdomen)     Patient location during evaluation: PACU Anesthesia Type: General Level of consciousness: awake and alert Pain management: pain level controlled Vital Signs Assessment: post-procedure vital signs reviewed and stable Respiratory status: spontaneous breathing, nonlabored ventilation, respiratory function stable and patient connected to nasal cannula oxygen Cardiovascular status: blood pressure returned to baseline and stable Postop Assessment: no apparent nausea or vomiting Anesthetic complications: no   No notable events documented.  Last Vitals:  Vitals:   01/28/21 1326 01/28/21 1420  BP: (!) 144/71 136/79  Pulse:    Resp: 16 16  Temp: 37 C   SpO2: 98% 100%    Last Pain:  Vitals:   01/28/21 1420  TempSrc:   PainSc: 2                  Carrie Schoonmaker

## 2021-02-17 DIAGNOSIS — Z9049 Acquired absence of other specified parts of digestive tract: Secondary | ICD-10-CM | POA: Diagnosis not present

## 2021-02-17 DIAGNOSIS — I8393 Asymptomatic varicose veins of bilateral lower extremities: Secondary | ICD-10-CM | POA: Diagnosis not present

## 2021-02-17 DIAGNOSIS — E039 Hypothyroidism, unspecified: Secondary | ICD-10-CM | POA: Diagnosis not present

## 2021-02-17 DIAGNOSIS — R600 Localized edema: Secondary | ICD-10-CM | POA: Diagnosis not present

## 2021-02-17 DIAGNOSIS — K5792 Diverticulitis of intestine, part unspecified, without perforation or abscess without bleeding: Secondary | ICD-10-CM | POA: Diagnosis not present

## 2021-02-17 DIAGNOSIS — M3500 Sicca syndrome, unspecified: Secondary | ICD-10-CM | POA: Diagnosis not present

## 2021-02-19 DIAGNOSIS — M3500 Sicca syndrome, unspecified: Secondary | ICD-10-CM | POA: Diagnosis not present

## 2021-02-19 DIAGNOSIS — E039 Hypothyroidism, unspecified: Secondary | ICD-10-CM | POA: Diagnosis not present

## 2021-03-30 ENCOUNTER — Encounter (HOSPITAL_BASED_OUTPATIENT_CLINIC_OR_DEPARTMENT_OTHER): Payer: Self-pay | Admitting: Urology

## 2021-03-30 ENCOUNTER — Observation Stay (HOSPITAL_COMMUNITY): Payer: Medicare PPO

## 2021-03-30 ENCOUNTER — Emergency Department (HOSPITAL_BASED_OUTPATIENT_CLINIC_OR_DEPARTMENT_OTHER): Payer: Medicare PPO

## 2021-03-30 ENCOUNTER — Other Ambulatory Visit: Payer: Self-pay

## 2021-03-30 ENCOUNTER — Observation Stay (HOSPITAL_BASED_OUTPATIENT_CLINIC_OR_DEPARTMENT_OTHER)
Admission: EM | Admit: 2021-03-30 | Discharge: 2021-03-31 | Disposition: A | Discharge status: Home / Self Care | Payer: Medicare PPO | Attending: Internal Medicine | Admitting: Internal Medicine

## 2021-03-30 DIAGNOSIS — G459 Transient cerebral ischemic attack, unspecified: Secondary | ICD-10-CM | POA: Diagnosis not present

## 2021-03-30 DIAGNOSIS — G454 Transient global amnesia: Principal | ICD-10-CM | POA: Insufficient documentation

## 2021-03-30 DIAGNOSIS — Z8551 Personal history of malignant neoplasm of bladder: Secondary | ICD-10-CM | POA: Insufficient documentation

## 2021-03-30 DIAGNOSIS — R413 Other amnesia: Secondary | ICD-10-CM | POA: Diagnosis not present

## 2021-03-30 DIAGNOSIS — Z8673 Personal history of transient ischemic attack (TIA), and cerebral infarction without residual deficits: Secondary | ICD-10-CM | POA: Diagnosis not present

## 2021-03-30 DIAGNOSIS — R479 Unspecified speech disturbances: Secondary | ICD-10-CM | POA: Diagnosis not present

## 2021-03-30 DIAGNOSIS — Z79899 Other long term (current) drug therapy: Secondary | ICD-10-CM | POA: Insufficient documentation

## 2021-03-30 DIAGNOSIS — Z20822 Contact with and (suspected) exposure to covid-19: Secondary | ICD-10-CM | POA: Diagnosis not present

## 2021-03-30 DIAGNOSIS — I6381 Other cerebral infarction due to occlusion or stenosis of small artery: Secondary | ICD-10-CM | POA: Diagnosis not present

## 2021-03-30 DIAGNOSIS — E039 Hypothyroidism, unspecified: Secondary | ICD-10-CM | POA: Diagnosis not present

## 2021-03-30 DIAGNOSIS — R4182 Altered mental status, unspecified: Secondary | ICD-10-CM | POA: Diagnosis not present

## 2021-03-30 DIAGNOSIS — R2689 Other abnormalities of gait and mobility: Secondary | ICD-10-CM | POA: Insufficient documentation

## 2021-03-30 DIAGNOSIS — R000 Tachycardia, unspecified: Secondary | ICD-10-CM | POA: Diagnosis not present

## 2021-03-30 DIAGNOSIS — Z87891 Personal history of nicotine dependence: Secondary | ICD-10-CM | POA: Insufficient documentation

## 2021-03-30 LAB — CBC WITH DIFFERENTIAL/PLATELET
Abs Immature Granulocytes: 0.02 10*3/uL (ref 0.00–0.07)
Basophils Absolute: 0 10*3/uL (ref 0.0–0.1)
Basophils Relative: 0 %
Eosinophils Absolute: 0 10*3/uL (ref 0.0–0.5)
Eosinophils Relative: 0 %
HCT: 39.8 % (ref 36.0–46.0)
Hemoglobin: 13.1 g/dL (ref 12.0–15.0)
Immature Granulocytes: 0 %
Lymphocytes Relative: 9 %
Lymphs Abs: 0.6 10*3/uL — ABNORMAL LOW (ref 0.7–4.0)
MCH: 28.6 pg (ref 26.0–34.0)
MCHC: 32.9 g/dL (ref 30.0–36.0)
MCV: 86.9 fL (ref 80.0–100.0)
Monocytes Absolute: 0.3 10*3/uL (ref 0.1–1.0)
Monocytes Relative: 5 %
Neutro Abs: 5.6 10*3/uL (ref 1.7–7.7)
Neutrophils Relative %: 86 %
Platelets: 289 10*3/uL (ref 150–400)
RBC: 4.58 MIL/uL (ref 3.87–5.11)
RDW: 14.3 % (ref 11.5–15.5)
WBC: 6.5 10*3/uL (ref 4.0–10.5)
nRBC: 0 % (ref 0.0–0.2)

## 2021-03-30 LAB — COMPREHENSIVE METABOLIC PANEL
ALT: 9 U/L (ref 0–44)
AST: 12 U/L — ABNORMAL LOW (ref 15–41)
Albumin: 4.7 g/dL (ref 3.5–5.0)
Alkaline Phosphatase: 51 U/L (ref 38–126)
Anion gap: 12 (ref 5–15)
BUN: 12 mg/dL (ref 8–23)
CO2: 25 mmol/L (ref 22–32)
Calcium: 10.3 mg/dL (ref 8.9–10.3)
Chloride: 103 mmol/L (ref 98–111)
Creatinine, Ser: 0.71 mg/dL (ref 0.44–1.00)
GFR, Estimated: >60 mL/min (ref >60)
Glucose, Bld: 158 mg/dL — ABNORMAL HIGH (ref 70–99)
Potassium: 3.4 mmol/L — ABNORMAL LOW (ref 3.5–5.1)
Sodium: 140 mmol/L (ref 135–145)
Total Bilirubin: 0.5 mg/dL (ref 0.3–1.2)
Total Protein: 7.3 g/dL (ref 6.5–8.1)

## 2021-03-30 LAB — RESP PANEL BY RT-PCR (FLU A&B, COVID) ARPGX2
Influenza A by PCR: NEGATIVE
Influenza B by PCR: NEGATIVE
SARS Coronavirus 2 by RT PCR: NEGATIVE

## 2021-03-30 LAB — TSH: TSH: 1.298 u[IU]/mL (ref 0.350–4.500)

## 2021-03-30 MED ORDER — POLYSACCHARIDE IRON COMPLEX 150 MG PO CAPS
150.0000 mg | ORAL_CAPSULE | Freq: Every day | ORAL | Status: DC
  Administered 2021-03-30 – 2021-03-31 (×2): 150 mg via ORAL
  Filled 2021-03-30 (×2): qty 1

## 2021-03-30 MED ORDER — ACETAMINOPHEN 325 MG PO TABS: 650.0000 mg | ORAL_TABLET | ORAL | Status: DC | PRN

## 2021-03-30 MED ORDER — ACETAMINOPHEN 650 MG RE SUPP: 650.0000 mg | RECTAL | Status: DC | PRN

## 2021-03-30 MED ORDER — FOLIC ACID 1 MG PO TABS
1.0000 mg | ORAL_TABLET | Freq: Every day | ORAL | Status: DC
  Administered 2021-03-30 – 2021-03-31 (×2): 1 mg via ORAL
  Filled 2021-03-30 (×2): qty 1

## 2021-03-30 MED ORDER — ENOXAPARIN SODIUM 40 MG/0.4ML IJ SOSY
40.0000 mg | PREFILLED_SYRINGE | INTRAMUSCULAR | Status: DC
  Administered 2021-03-30: 40 mg via SUBCUTANEOUS
  Filled 2021-03-30: qty 0.4

## 2021-03-30 MED ORDER — WHITE PETROLATUM EX OINT
TOPICAL_OINTMENT | CUTANEOUS | Status: AC
  Filled 2021-03-30: qty 28.35

## 2021-03-30 MED ORDER — ACETAMINOPHEN 160 MG/5ML PO SOLN: 650.0000 mg | ORAL | Status: DC | PRN

## 2021-03-30 MED ORDER — LEVOTHYROXINE SODIUM 25 MCG PO TABS
125.0000 ug | ORAL_TABLET | Freq: Every day | ORAL | Status: DC
  Administered 2021-03-31: 125 ug via ORAL
  Filled 2021-03-30: qty 1

## 2021-03-30 MED ORDER — IBUPROFEN 200 MG PO TABS: 400.0000 mg | ORAL_TABLET | Freq: Three times a day (TID) | ORAL | Status: DC | PRN

## 2021-03-30 MED ORDER — STROKE: EARLY STAGES OF RECOVERY BOOK: Freq: Once | Status: AC

## 2021-03-30 MED ORDER — HYDROXYCHLOROQUINE SULFATE 200 MG PO TABS
200.0000 mg | ORAL_TABLET | Freq: Every day | ORAL | Status: DC
  Administered 2021-03-30 – 2021-03-31 (×2): 200 mg via ORAL
  Filled 2021-03-30 (×2): qty 1

## 2021-03-30 MED ORDER — OYSTER SHELL CALCIUM/D3 500-5 MG-MCG PO TABS
1.0000 | ORAL_TABLET | Freq: Every day | ORAL | Status: DC
  Administered 2021-03-30 – 2021-03-31 (×2): 1 via ORAL
  Filled 2021-03-30 (×2): qty 1

## 2021-03-30 NOTE — ED Notes (Signed)
ED Provider at bedside. 

## 2021-03-30 NOTE — ED Notes (Signed)
Report called to Harlin Heys, RN

## 2021-03-30 NOTE — H&P (Signed)
History and Physical    Jennifer Franco AST:[id number] DOB: 1956-02-15 DOA: 03/30/2021  PCP: Kelton Pillar, MD (Confirm with patient/family/NH records and if not entered, this has to be entered at [facility] point of entry) Patient coming from: Home  I have personally briefly reviewed patient's old medical records in Jennifer Franco  Chief Complaint: Memory loss  HPI: Jennifer Franco is a 65 y.o. female with medical history significant of Sjogren syndrome on hydroxychloroquine, hypothyroidism, presented with worsening of memory loss.  Her symptoms started about 4 months ago when patient had appendicitis for which she underwent appendectomy.  She has some delay of surgical wound healing and started to feel depressed since.  Family noticed the patient has had frequent mood swings since this summer, this week, patient was found to have disturbed sleeping cycle's episode of intermittent confusion and agitations and somnolence.  And patient's short memory significantly affected.  Patient also reported that her mom has just recently moved into her house, and her mom has poor ambulation and patient herself has been the main caregiver for the last 2 to 3 weeks, she feels depressed and stressed out, but no suicidal ideation.  She lost about 5 pounds this year she attributed for she has been eating healthy food.  She denied any visual or hearing hallucinations.  No new medications.  At baseline, she is able to take care of herself with full scope of daily activity.  ED Course: Vital signs stable, patient was evaluated by neurology in the ED at [facility], recommend brain MRI.  Review of Systems: As per HPI otherwise 14 point review of systems negative.    Past Medical History:  Diagnosis Date   Anemia    Anginal pain (HCC)    anxiety   Anxiety    Cancer (HCC)    bladder   Chills    CTS (carpal tunnel syndrome)    LEFT   Cyclical vomiting    Depression    Diarrhea, unspecified     Diverticulitis    Fibromyalgia    Hemorrhoids    History of adenomatous polyp of colon    History of morbid obesity    Hypothyroid    Hypothyroidism    Intractable nausea and vomiting    Irregular bowel habits    Joint pain    LLQ abdominal pain    Morbid obesity (HCC)    Nausea and vomiting    Onychomycosis    RA (rheumatoid arthritis) (HCC)    Reflux esophagitis    Reflux esophagitis    Sjogren's syndrome (HCC)    Tinea corporis    Unspecified mood (affective) disorder (HCC)    Weight loss     Past Surgical History:  Procedure Laterality Date   ADENOIDECTOMY     CARPAL TUNNEL RELEASE Right    CESAREAN SECTION     CESAREAN SECTION     CYSTOSCOPY WITH STENT PLACEMENT N/A 08/01/2019   Procedure: CYSTOSCOPY WITH URETHRAL DILATION, BILATERAL RETROGRADE PYELOGRAMS, TRANSURETHRAL RESECTION OF SMALL BLADDER TUMOR, INSERTION OF BILATERAL OPEN ENDED CATHETERS WITH BILATERAL INJECTION OF FIREFLY;  Surgeon: Irine Seal, MD;  Location: [facility];  Service: Urology;  Laterality: N/A;   FLEXIBLE SIGMOIDOSCOPY N/A 08/01/2019   Procedure: FLEXIBLE SIGMOIDOSCOPY;  Surgeon: Ileana Roup, MD;  Location: [facility];  Service: General;  Laterality: N/A;   LAPAROSCOPIC APPENDECTOMY N/A 01/28/2021   Procedure: APPENDECTOMY LAPAROSCOPIC;  Surgeon: Ileana Roup, MD;  Location: [facility];  Service: General;  Laterality: N/A;  wisdom teeth       reports that she has quit smoking. She has never used smokeless tobacco. She reports current alcohol use. She reports that she does not use drugs.  Allergies  Allergen Reactions   Etodolac Diarrhea   Topamax [Topiramate] Other (See Comments)    Brain flutters     Family History  Problem Relation Age of Onset   Glaucoma Mother    GER disease Mother    Colon polyps Mother    Breast cancer Other 29       Maternal Great Grandmother   Osteoarthritis Father    Alzheimer's disease Father    Colon polyps Maternal Grandmother    Colon polyps  Maternal Grandfather      Prior to Admission medications   Medication Sig Start Date End Date Taking? Authorizing Provider  Calcium-Vitamin D 600-200 MG-UNIT tablet Take 1 tablet by mouth daily.   Yes [provider]  folic acid (FOLVITE) 1 MG tablet Take 1 mg by mouth daily.  04/06/15  Yes [provider]  hydroxychloroquine (PLAQUENIL) 200 MG tablet Take 200 mg by mouth daily. 06/05/19  Yes [provider]  iron polysaccharides (NIFEREX) 150 MG capsule Take 150 mg by mouth daily.  03/26/10  Yes [provider]  levothyroxine (SYNTHROID) 125 MCG tablet Take 125 mcg by mouth daily before breakfast.  02/04/19  Yes [provider]  Efinaconazole 10 % SOLN Apply 8 mLs topically in the morning and at bedtime. Patient not taking: Reported on 01/18/2021 07/05/20   Felipa Furnace, DPM  ibuprofen (ADVIL) 200 MG tablet Take 400 mg by mouth every 8 (eight) hours as needed for moderate pain.     [provider]  ondansetron (ZOFRAN ODT) 4 MG disintegrating tablet Take 1 tablet (4 mg total) by mouth every 8 (eight) hours as needed for nausea or vomiting. Patient not taking: Reported on 01/18/2021 12/06/18   Volanda Napoleon, PA-C    Physical Exam: Vitals:   03/30/21 1500 03/30/21 1636 03/30/21 1803 03/30/21 1952  BP: 134/81 130/68 (!) 155/65 135/72  Pulse: (!) 51 (!) 56 (!) 58 (!) 48  Resp: 16 18 19 15   Temp:   98.9 F (37.2 C) 99.4 F (37.4 C)  TempSrc:   Oral Oral  SpO2: 100% 99% 100% 99%  Weight:      Height:        Constitutional: NAD, calm, comfortable Vitals:   03/30/21 1500 03/30/21 1636 03/30/21 1803 03/30/21 1952  BP: 134/81 130/68 (!) 155/65 135/72  Pulse: (!) 51 (!) 56 (!) 58 (!) 48  Resp: 16 18 19 15   Temp:   98.9 F (37.2 C) 99.4 F (37.4 C)  TempSrc:   Oral Oral  SpO2: 100% 99% 100% 99%  Weight:      Height:       Eyes: PERRL, lids and conjunctivae normal ENMT: Mucous membranes are moist. Posterior pharynx clear of any  exudate or lesions.Normal dentition.  Neck: normal, supple, no masses, no thyromegaly Respiratory: clear to auscultation bilaterally, no wheezing, no crackles. Normal respiratory effort. No accessory muscle use.  Cardiovascular: Regular rate and rhythm, no murmurs / rubs / gallops. No extremity edema. 2+ pedal pulses. No carotid bruits.  Abdomen: no tenderness, no masses palpated. No hepatosplenomegaly. Bowel sounds positive.  Musculoskeletal: no clubbing / cyanosis. No joint deformity upper and lower extremities. Good ROM, no contractures. Normal muscle tone.  Skin: no rashes, lesions, ulcers. No induration Neurologic: CN 2-12 grossly intact. Sensation  intact, DTR normal. Strength 5/5 in all 4.  Psychiatric: Normal judgment and insight. Alert and oriented x 3. Normal mood.    Labs on Admission: I have personally reviewed following labs and imaging studies  CBC: Recent Labs  Lab 03/30/21 1326  WBC 6.5  NEUTROABS 5.6  HGB 13.1  HCT 39.8  MCV 86.9  PLT 762   Basic Metabolic Panel: Recent Labs  Lab 03/30/21 1326  NA 140  K 3.4*  CL 103  CO2 25  GLUCOSE 158*  BUN 12  CREATININE 0.71  CALCIUM 10.3   GFR: Estimated Creatinine Clearance: 69.7 mL/min (by C-G formula based on SCr of 0.71 mg/dL). Liver Function Tests: Recent Labs  Lab 03/30/21 1326  AST 12*  ALT 9  ALKPHOS 51  BILITOT 0.5  PROT 7.3  ALBUMIN 4.7   No results for input(s): LIPASE, AMYLASE in the last 168 hours. No results for input(s): AMMONIA in the last 168 hours. Coagulation Profile: No results for input(s): INR, PROTIME in the last 168 hours. Cardiac Enzymes: No results for input(s): CKTOTAL, CKMB, CKMBINDEX, TROPONINI in the last 168 hours. BNP (last 3 results) No results for input(s): PROBNP in the last 8760 hours. HbA1C: No results for input(s): HGBA1C in the last 72 hours. CBG: No results for input(s): GLUCAP in the last 168 hours. Lipid Profile: No results for input(s): CHOL, HDL, LDLCALC,  TRIG, CHOLHDL, LDLDIRECT in the last 72 hours. Thyroid Function Tests: No results for input(s): TSH, T4TOTAL, FREET4, T3FREE, THYROIDAB in the last 72 hours. Anemia Panel: No results for input(s): VITAMINB12, FOLATE, FERRITIN, TIBC, IRON, RETICCTPCT in the last 72 hours. Urine analysis:    Component Value Date/Time   COLORURINE AMBER (A) 12/06/2018 1731   APPEARANCEUR CLEAR 12/06/2018 1731   LABSPEC >1.030 (H) 12/06/2018 1731   PHURINE 6.0 12/06/2018 1731   GLUCOSEU NEGATIVE 12/06/2018 1731   HGBUR NEGATIVE 12/06/2018 1731   BILIRUBINUR NEGATIVE 12/06/2018 1731   KETONESUR 15 (A) 12/06/2018 1731   PROTEINUR NEGATIVE 12/06/2018 1731   NITRITE NEGATIVE 12/06/2018 1731   LEUKOCYTESUR NEGATIVE 12/06/2018 1731    Radiological Exams on Admission: CT Head Wo Contrast  Result Date: 03/30/2021 CLINICAL DATA:  Altered mental status EXAM: CT HEAD WITHOUT CONTRAST TECHNIQUE: Contiguous axial images were obtained from the base of the skull through the vertex without intravenous contrast. COMPARISON:  None. FINDINGS: Brain: No acute intracranial hemorrhage, mass effect, or herniation. No extra-axial fluid collections. No evidence of acute territorial infarct. No hydrocephalus. Mild cortical volume loss. Vascular: No hyperdense vessel or unexpected calcification. Skull: Normal. Negative for fracture or focal lesion. Sinuses/Orbits: No acute finding. Other: None. IMPRESSION: No acute intracranial process identified. Electronically Signed   By: Ofilia Neas M.D.   On: 03/30/2021 14:03   DG Chest Portable 1 View  Result Date: 03/30/2021 CLINICAL DATA:  Altered mental status EXAM: PORTABLE CHEST 1 VIEW COMPARISON:  11/05/2012 FINDINGS: The heart size and mediastinal contours are within normal limits. Both lungs are clear. The visualized skeletal structures are unremarkable. IMPRESSION: No acute abnormality of the lungs in AP portable projection. Electronically Signed   By: Delanna Ahmadi M.D.   On:  03/30/2021 13:47    EKG: Independently reviewed.  Sinus rhythm, sinus bradycardia, no PR QTC throughout changes.  Assessment/Plan Principal Problem:   TIA (transient ischemic attack)  (please populate well all problems here in Problem List. (For example, if patient is on BP meds at home and you resume or decide to hold them, it is a  problem that needs to be her. Same for CAD, COPD, HLD and so on)  Transient global amnesia -As per neurology, we will order MRI brain. -Suspect early dementia versus cognitive impairment, outpatient neurology follow-up for MMS -Promote sleep hygiene, avoid sedation medications. -Check TSH, B12  Question of depression -Versus mood swing, patient agreed to be evaluated by psychiatry, will consult psychiatry and then likely will need outpatient psychiatric follow-up.  Sinus bradycardia -Asymptomatic, denied any palpitations, no feeling of lightheadedness. -TSH pending -Check cortisol level  Sjogren syndrome -Fairly controlled, CBC normal, LFT within normal limits, no vision problems.  DVT prophylaxis: Lovenox Code Status: Full code Family Communication: Son and daughter at bedside Disposition Plan: Expect less than 2 midnight hospital stay Consults called: Neurology Admission status: Telemetry obs   Lequita Halt MD [facility] Pager [phone]  03/30/2021, 7:59 PM

## 2021-03-30 NOTE — Consult Note (Addendum)
NEUROLOGY CONSULTATION NOTE   Date of service: March 30, 2021 Patient Name: Jennifer Franco MRN:  [id number] DOB:  [birth year] Reason for consult: "concern for TGA" Requesting Provider: Lequita Halt, MD _ _ _   _ __   _ __ _ _  __ __   _ __   __ _  History of Present Illness  Jennifer Franco is a 65 y.o. female with PMH significant for anemia, hypothyroidism, rheumatoid arthritis, morbid obesity, sjogren's syndrome, reflux esophagitis, who presents with repeatedly asking same questions and disoriented to date without much change in wakefullness. Symptoms concerning for TGA. She initially presented to [place] ED where [facility] w/o C was negative for any acute intracranial abnormalities. She was transferred to [facility] for further workup and evaluation.  History obtained from patient and on discussion with patient's daughter and son over the phone. She reports baseline depression. Had myalgias and arthralgias for the last 2-3 days. She has very little recollection of the events today but does seem to be able to remember things from when she came in to the hospital. Still feels like she pretty much skipped a full day and does not remember much about what happened on Monday.   Endorsing significant depression and stress, most concerned about her mother who she cares for.  mRS: 0 tNKase/thrombectomy: not offered, symptoms resolved. LKW: 2200 on 03/29/21.   ROS   Constitutional Denies weight loss, fever and chills.   HEENT Denies changes in vision and hearing.   Respiratory Denies SOB and cough.   CV Denies palpitations and CP   GI Denies abdominal pain, nausea, vomiting and diarrhea.   GU Denies dysuria and urinary frequency.   MSK Denies myalgia and joint pain.   Skin Denies rash and pruritus.   Neurological Denies headache and syncope.   Psychiatric Denies recent changes in mood. Denies anxiety and depression.    Past History   Past Medical History:  Diagnosis Date    Anemia    Anginal pain (HCC)    anxiety   Anxiety    Cancer (HCC)    bladder   Chills    CTS (carpal tunnel syndrome)    LEFT   Cyclical vomiting    Depression    Diarrhea, unspecified    Diverticulitis    Fibromyalgia    Hemorrhoids    History of adenomatous polyp of colon    History of morbid obesity    Hypothyroid    Hypothyroidism    Intractable nausea and vomiting    Irregular bowel habits    Joint pain    LLQ abdominal pain    Morbid obesity (HCC)    Nausea and vomiting    Onychomycosis    RA (rheumatoid arthritis) (HCC)    Reflux esophagitis    Reflux esophagitis    Sjogren's syndrome (HCC)    Tinea corporis    Unspecified mood (affective) disorder (HCC)    Weight loss    Past Surgical History:  Procedure Laterality Date   ADENOIDECTOMY     CARPAL TUNNEL RELEASE Right    CESAREAN SECTION     CESAREAN SECTION     CYSTOSCOPY WITH STENT PLACEMENT N/A 08/01/2019   Procedure: CYSTOSCOPY WITH URETHRAL DILATION, BILATERAL RETROGRADE PYELOGRAMS, TRANSURETHRAL RESECTION OF SMALL BLADDER TUMOR, INSERTION OF BILATERAL OPEN ENDED CATHETERS WITH BILATERAL INJECTION OF FIREFLY;  Surgeon: Irine Seal, MD;  Location: [facility];  Service: Urology;  Laterality: N/A;   FLEXIBLE SIGMOIDOSCOPY N/A 08/01/2019  Procedure: FLEXIBLE SIGMOIDOSCOPY;  Surgeon: Ileana Roup, MD;  Location: [facility];  Service: General;  Laterality: N/A;   LAPAROSCOPIC APPENDECTOMY N/A 01/28/2021   Procedure: APPENDECTOMY LAPAROSCOPIC;  Surgeon: Ileana Roup, MD;  Location: [facility];  Service: General;  Laterality: N/A;   wisdom teeth     Family History  Problem Relation Age of Onset   Glaucoma Mother    GER disease Mother    Colon polyps Mother    Breast cancer Other 51       Maternal Great Grandmother   Osteoarthritis Father    Alzheimer's disease Father    Colon polyps Maternal Grandmother    Colon polyps Maternal Grandfather    Social History   Socioeconomic History   Marital  status: Divorced    Spouse name: Not on file   Number of children: Not on file   Years of education: Not on file   Highest education level: Not on file  Occupational History   Not on file  Tobacco Use   Smoking status: Former   Smokeless tobacco: Never  Vaping Use   Vaping Use: Never used  Substance and Sexual Activity   Alcohol use: Yes    Comment: rare   Drug use: No   Sexual activity: Not on file  Other Topics Concern   Not on file  Social History Narrative   Not on file   Social Determinants of Health   Financial Resource Strain: Not on file  Food Insecurity: Not on file  Transportation Needs: Not on file  Physical Activity: Not on file  Stress: Not on file  Social Connections: Not on file   Allergies  Allergen Reactions   Etodolac Diarrhea   Topamax [Topiramate] Other (See Comments)    Brain flutters     Medications   Medications Prior to Admission  Medication Sig Dispense Refill Last Dose   Calcium-Vitamin D 600-200 MG-UNIT tablet Take 1 tablet by mouth daily.   72/53/6644   folic acid (FOLVITE) 1 MG tablet Take 1 mg by mouth daily.    03/29/2021   hydroxychloroquine (PLAQUENIL) 200 MG tablet Take 200 mg by mouth daily.   03/29/2021   iron polysaccharides (NIFEREX) 150 MG capsule Take 150 mg by mouth daily.    03/29/2021   levothyroxine (SYNTHROID) 125 MCG tablet Take 125 mcg by mouth daily before breakfast.    03/29/2021   Efinaconazole 10 % SOLN Apply 8 mLs topically in the morning and at bedtime. (Patient not taking: Reported on 01/18/2021) 8 mL 3    ibuprofen (ADVIL) 200 MG tablet Take 400 mg by mouth every 8 (eight) hours as needed for moderate pain.       ondansetron (ZOFRAN ODT) 4 MG disintegrating tablet Take 1 tablet (4 mg total) by mouth every 8 (eight) hours as needed for nausea or vomiting. (Patient not taking: Reported on 01/18/2021) 4 tablet 0      Vitals   Vitals:   03/30/21 1415 03/30/21 1500 03/30/21 1636 03/30/21 1803  BP: 127/82 134/81  130/68 (!) 155/65  Pulse: (!) 47 (!) 51 (!) 56 (!) 58  Resp: 12 16 18 19   Temp:    98.9 F (37.2 C)  TempSrc:    Oral  SpO2: 100% 100% 99% 100%  Weight:      Height:         Body mass index is 29.17 kg/m.  Physical Exam   General: Laying comfortably in bed; in no acute distress.  HENT: Normal  oropharynx and mucosa. Normal external appearance of ears and nose.  Neck: Supple, no pain or tenderness  CV: No JVD. No peripheral edema.  Pulmonary: Symmetric Chest rise. Normal respiratory effort.  Abdomen: Soft to touch, non-tender.  Ext: No cyanosis, edema, or deformity  Skin: No rash. Normal palpation of skin.   Musculoskeletal: Normal digits and nails by inspection. No clubbing.   Neurologic Examination  Mental status/Cognition: Alert, oriented to self, place, month and year, good attention.  Speech/language: Fluent, comprehension intact, object naming intact, repetition intact.  Cranial nerves:   CN II Pupils equal and reactive to light, no VF deficits    CN III,IV,VI EOM intact, no gaze preference or deviation, no nystagmus    CN V normal sensation in V1, V2, and V3 segments bilaterally    CN VII no asymmetry, no nasolabial fold flattening    CN VIII normal hearing to speech   CN IX & X normal palatal elevation, no uvular deviation    CN XI 5/5 head turn and 5/5 shoulder shrug bilaterally    CN XII midline tongue protrusion    Motor:  Muscle bulk: normal, tone normal, pronator drift none tremor none Mvmt Root Nerve  Muscle Right Left Comments  SA C5/6 Ax Deltoid     EF C5/6 Mc Biceps     EE C6/7/8 Rad Triceps     WF C6/7 Med FCR     WE C7/8 PIN ECU     F Ab C8/T1 U ADM/FDI     HF L1/2/3 Fem Illopsoas     KE L2/3/4 Fem Quad     DF L4/5 D Peron Tib Ant     PF S1/2 Tibial Grc/Sol      Reflexes:  Right Left Comments  Pectoralis      Biceps (C5/6)     Brachioradialis (C5/6)      Triceps (C6/7)      Patellar (L3/4)      Achilles (S1)      Hoffman      Plantar      Jaw jerk    Sensation:  Light touch    Pin prick    Temperature    Vibration   Proprioception    Coordination/Complex Motor:  - Finger to Nose intact BL - Heel to shin intact BL - Rapid alternating movement are normal - Gait: Deferred.  Labs   CBC:  Recent Labs  Lab 03/30/21 1326  WBC 6.5  NEUTROABS 5.6  HGB 13.1  HCT 39.8  MCV 86.9  PLT 485    Basic Metabolic Panel:  Lab Results  Component Value Date   NA 140 03/30/2021   K 3.4 (L) 03/30/2021   CO2 25 03/30/2021   GLUCOSE 158 (H) 03/30/2021   BUN 12 03/30/2021   CREATININE 0.71 03/30/2021   CALCIUM 10.3 03/30/2021   GFRNONAA >60 03/30/2021   GFRAA >60 08/03/2019   Lipid Panel: No results found for: LDLCALC HgbA1c:  Lab Results  Component Value Date   HGBA1C 6.0 (H) 07/24/2019   Urine Drug Screen: No results found for: LABOPIA, COCAINSCRNUR, LABBENZ, AMPHETMU, THCU, LABBARB  Alcohol Level No results found for: [place]  CT Head without contrast(Personally reviewed): CTH was negative for a large hypodensity concerning for a large territory infarct or hyperdensity concerning for an ICH  MR Angio head without contrast and Carotid Duplex BL: pending  MRI Brain(Personally reviewed): No acute abnormality   Impression   Jennifer Franco is a 65 y.o. female with PMH  significant for anemia, hypothyroidism, rheumatoid arthritis, morbid obesity, sjogren's syndrome, reflux esophagitis, who presents with repeatedly asking same questions and disoriented to date without much change in wakefullness. Symptoms concerning for TGA. She has poor recollection of events on Monday but otherwise memory is mostly intact. Her neurologic examination is notable for no focal deficit.  Primary Diagnosis:  Other cerebral infarction due to occlusion of stenosis of small artery.  Secondary Diagnosis: Type 2 diabetes mellitus w/o complications  Recommendations  Plan:  Transient Global Amnesia: - Frequent Neuro checks per  stroke unit protocol - Recommend brain imaging with MRI Brain without contrast - Recommend Vascular imaging with MRA Angio Head without contrast and US Carotid doppler for TIA workup - Recommend obtaining TTE - Recommend obtaining Lipid panel with LDL - Please start statin if LDL > 70 - Recommend HbA1c - Antithrombotic - aspirin 81mg  daily. - Recommend DVT ppx - SBP goal - permissive hypertension first 24 h < 220/110. Held home meds.  - Recommend Telemetry monitoring for arrythmia - Recommend bedside swallow screen prior to PO intake. - Stroke education booklet - Recommend PT/OT/SLP consult _____________________________________________________________________  Plan discussed with patient's son and daughter over phone.  Thank you for the opportunity to take part in the care of this patient. If you have any further questions, please contact the neurology consultation attending.  Signed,  [place] Pager Number [phone] _ _ _   _ __   _ __ _ _  __ __   _ __   __ _

## 2021-03-30 NOTE — ED Notes (Signed)
[facility] at bedside to transport pt.  Pt stable for transport.

## 2021-03-30 NOTE — ED Triage Notes (Addendum)
Pt states woke up this morning and couldn't remember anything that happened last week.  States "I didn't remember it was thanksgiving". Able to recall birthday, unable to recall year.  Denies pain   LKN last night at 1900, started asking questions, was disoriented

## 2021-03-30 NOTE — ED Provider Notes (Signed)
[place] EMERGENCY DEPT Provider Note   CSN: [id number] Arrival date & time: 03/30/21  1252     History Chief Complaint  Patient presents with   Memory Loss    Jennifer Franco is a 65 y.o. female.  HPI Level 5 caveat due to altered mental status. Reportedly confusion started last night.  Asking more questions and some disorientation.  Reportedly has been a little off for a few days though.  Today much more difficulty with memory.  Able to identify her family members but states she was surprised it was not August.  More confusion.  Anxious about it.  Denies headache.  Denies numbness or weakness.  Has been ambulatory here properly.  Did not remember that Thanksgiving is this Thursday.  Had been asking repetitive questions to family about what the date was.  Apparently has had a stressful event yesterday or today with the return of her sonWith some other issues.    Past Medical History:  Diagnosis Date   Anemia    Anginal pain (HCC)    anxiety   Anxiety    Cancer (HCC)    bladder   Chills    CTS (carpal tunnel syndrome)    LEFT   Cyclical vomiting    Depression    Diarrhea, unspecified    Diverticulitis    Fibromyalgia    Hemorrhoids    History of adenomatous polyp of colon    History of morbid obesity    Hypothyroid    Hypothyroidism    Intractable nausea and vomiting    Irregular bowel habits    Joint pain    LLQ abdominal pain    Morbid obesity (HCC)    Nausea and vomiting    Onychomycosis    RA (rheumatoid arthritis) (HCC)    Reflux esophagitis    Reflux esophagitis    Sjogren's syndrome (HCC)    Tinea corporis    Unspecified mood (affective) disorder (HCC)    Weight loss     Patient Active Problem List   Diagnosis Date Noted   Status post laparoscopic-assisted sigmoidectomy 08/01/2019    Past Surgical History:  Procedure Laterality Date   ADENOIDECTOMY     CARPAL TUNNEL RELEASE Right    CESAREAN SECTION     CESAREAN  SECTION     CYSTOSCOPY WITH STENT PLACEMENT N/A 08/01/2019   Procedure: CYSTOSCOPY WITH URETHRAL DILATION, BILATERAL RETROGRADE PYELOGRAMS, TRANSURETHRAL RESECTION OF SMALL BLADDER TUMOR, INSERTION OF BILATERAL OPEN ENDED CATHETERS WITH BILATERAL INJECTION OF FIREFLY;  Surgeon: Irine Seal, MD;  Location: [facility];  Service: Urology;  Laterality: N/A;   FLEXIBLE SIGMOIDOSCOPY N/A 08/01/2019   Procedure: FLEXIBLE SIGMOIDOSCOPY;  Surgeon: Ileana Roup, MD;  Location: [facility];  Service: General;  Laterality: N/A;   LAPAROSCOPIC APPENDECTOMY N/A 01/28/2021   Procedure: APPENDECTOMY LAPAROSCOPIC;  Surgeon: Ileana Roup, MD;  Location: [facility];  Service: General;  Laterality: N/A;   wisdom teeth       OB History   No obstetric history on file.     Family History  Problem Relation Age of Onset   Glaucoma Mother    GER disease Mother    Colon polyps Mother    Breast cancer Other 73       Maternal Great Grandmother   Osteoarthritis Father    Alzheimer's disease Father    Colon polyps Maternal Grandmother    Colon polyps Maternal Grandfather     Social History   Tobacco Use   Smoking status:  Former   Smokeless tobacco: Never  Scientific laboratory technician Use: Never used  Substance Use Topics   Alcohol use: Yes    Comment: rare   Drug use: No    Home Medications Prior to Admission medications   Medication Sig Start Date End Date Taking? Authorizing Provider  Calcium-Vitamin D 600-200 MG-UNIT tablet Take 1 tablet by mouth daily.    [provider]  Efinaconazole 10 % SOLN Apply 8 mLs topically in the morning and at bedtime. Patient not taking: Reported on 01/18/2021 07/05/20   Felipa Furnace, DPM  folic acid (FOLVITE) 1 MG tablet Take 1 mg by mouth daily.  04/06/15   [provider]  hydroxychloroquine (PLAQUENIL) 200 MG tablet Take 200 mg by mouth daily. 06/05/19   [provider]  ibuprofen (ADVIL) 200 MG tablet Take 400 mg by mouth every 8 (eight) hours  as needed for moderate pain.     [provider]  iron polysaccharides (NIFEREX) 150 MG capsule Take 150 mg by mouth daily.  03/26/10   [provider]  levothyroxine (SYNTHROID) 125 MCG tablet Take 125 mcg by mouth daily before breakfast.  02/04/19   [provider]  ondansetron (ZOFRAN ODT) 4 MG disintegrating tablet Take 1 tablet (4 mg total) by mouth every 8 (eight) hours as needed for nausea or vomiting. Patient not taking: Reported on 01/18/2021 12/06/18   Providence Lanius A, PA-C    Allergies    Etodolac and Topamax [topiramate]  Review of Systems   Review of Systems  Unable to perform ROS: Mental status change   Physical Exam Updated Vital Signs BP (!) 145/72 (BP Location: Right Arm)   Pulse (!) 50   Temp 98.9 F (37.2 C) (Oral)   Resp (!) 21   Ht 5\' 3"  (1.6 m)   Wt 74.7 kg   SpO2 100%   BMI 29.17 kg/m   Physical Exam Vitals and nursing note reviewed.  Constitutional:      Appearance: Normal appearance.  HENT:     Head: Normocephalic.     Mouth/Throat:     Mouth: Mucous membranes are moist.  Eyes:     Pupils: Pupils are equal, round, and reactive to light.  Cardiovascular:     Rate and Rhythm: Regular rhythm. Bradycardia present.  Abdominal:     Tenderness: There is no abdominal tenderness.  Musculoskeletal:        General: No tenderness.     Cervical back: Neck supple.  Skin:    General: Skin is warm.     Capillary Refill: Capillary refill takes less than 2 seconds.  Neurological:     Mental Status: She is alert.     Comments: Awake and pleasant.  Moving all extremities.  Some difficulty with memory.  Unsure of the date.  Difficulty remembering list of 3 things.  Did however eventually get 2 of them but do not remember the third.  Confused to the date and states she thought it was in August.    ED Results / Procedures / Treatments   Labs (all labs ordered are listed, but only abnormal results are displayed) Labs Reviewed   COMPREHENSIVE METABOLIC PANEL - Abnormal; Notable for the following components:      Result Value   Potassium 3.4 (*)    Glucose, Bld 158 (*)    AST 12 (*)    All other components within normal limits  CBC WITH DIFFERENTIAL/PLATELET - Abnormal; Notable for the following components:  Lymphs Abs 0.6 (*)    All other components within normal limits  RESP PANEL BY RT-PCR (FLU A&B, COVID) [id number]  URINALYSIS, ROUTINE W REFLEX MICROSCOPIC  RAPID URINE DRUG SCREEN, HOSP PERFORMED  TSH    EKG EKG Interpretation  Date/Time:  Tuesday March 30 2021 13:12:46 EST Ventricular Rate:  41 PR Interval:  120 QRS Duration: 124 QT Interval:  449 QTC Calculation: 371 R Axis:   26 Text Interpretation: Sinus bradycardia Nonspecific intraventricular conduction delay Anteroseptal infarct, old Nonspecific T abnormalities, lateral leads Rate decreased from prior. Confirmed by Davonna Belling [phone]) on 03/30/2021 1:48:33 PM  Radiology CT Head Wo Contrast  Result Date: 03/30/2021 CLINICAL DATA:  Altered mental status EXAM: CT HEAD WITHOUT CONTRAST TECHNIQUE: Contiguous axial images were obtained from the base of the skull through the vertex without intravenous contrast. COMPARISON:  None. FINDINGS: Brain: No acute intracranial hemorrhage, mass effect, or herniation. No extra-axial fluid collections. No evidence of acute territorial infarct. No hydrocephalus. Mild cortical volume loss. Vascular: No hyperdense vessel or unexpected calcification. Skull: Normal. Negative for fracture or focal lesion. Sinuses/Orbits: No acute finding. Other: None. IMPRESSION: No acute intracranial process identified. Electronically Signed   By: Ofilia Neas M.D.   On: 03/30/2021 14:03   DG Chest Portable 1 View  Result Date: 03/30/2021 CLINICAL DATA:  Altered mental status EXAM: PORTABLE CHEST 1 VIEW COMPARISON:  11/05/2012 FINDINGS: The heart size and mediastinal contours are within normal limits. Both lungs are  clear. The visualized skeletal structures are unremarkable. IMPRESSION: No acute abnormality of the lungs in AP portable projection. Electronically Signed   By: Delanna Ahmadi M.D.   On: 03/30/2021 13:47    Procedures Procedures   Medications Ordered in ED Medications - No data to display  ED Course  I have reviewed the triage vital signs and the nursing notes.  Pertinent labs & imaging results that were available during my care of the patient were reviewed by me and considered in my medical decision making (see chart for details).    MDM Rules/Calculators/A&P                          Patient with memory loss.  Began potentially last night but worse today.  She states is almost as if she is lost some time since August.  Does have some mild confusion/difficulty making new memories.  The best she got out of remembering 3 things was to.  No other neurodeficit.  Awake appropriate but somewhat nervous about the events.  Moving all extremities equally.  Does have bradycardia.  History of hypothyroidism.  TSH still pending.  Head CT done reassuring.  This potentially could be abnormality such as transient global amnesia.  Did have recent stressor with the family member.  Not a tPA candidate.  Will transfer to [facility] for to the hospitalist for admission.  Discussed with Dr. Quinn Axe from neurology.  Will see when she arrives over there.  Does not need a teleneurology consult here. Final Clinical Impression(s) / ED Diagnoses Final diagnoses:  Memory change    Rx / DC Orders ED Discharge Orders     None        Davonna Belling, MD 03/30/21 1436

## 2021-03-30 NOTE — ED Notes (Signed)
Patient transported to CT 

## 2021-03-31 ENCOUNTER — Observation Stay (HOSPITAL_COMMUNITY): Payer: Medicare PPO

## 2021-03-31 ENCOUNTER — Observation Stay (HOSPITAL_BASED_OUTPATIENT_CLINIC_OR_DEPARTMENT_OTHER): Payer: Medicare PPO

## 2021-03-31 DIAGNOSIS — I771 Stricture of artery: Secondary | ICD-10-CM | POA: Diagnosis not present

## 2021-03-31 DIAGNOSIS — G459 Transient cerebral ischemic attack, unspecified: Secondary | ICD-10-CM | POA: Diagnosis not present

## 2021-03-31 DIAGNOSIS — F32A Depression, unspecified: Secondary | ICD-10-CM

## 2021-03-31 DIAGNOSIS — R479 Unspecified speech disturbances: Secondary | ICD-10-CM

## 2021-03-31 DIAGNOSIS — R410 Disorientation, unspecified: Secondary | ICD-10-CM | POA: Diagnosis not present

## 2021-03-31 LAB — HEMOGLOBIN A1C
Hgb A1c MFr Bld: 5.8 % — ABNORMAL HIGH (ref 4.8–5.6)
Mean Plasma Glucose: 119.76 mg/dL

## 2021-03-31 LAB — LIPID PANEL
Cholesterol: 229 mg/dL — ABNORMAL HIGH (ref 0–200)
HDL: 76 mg/dL (ref >40)
LDL Cholesterol: 128 mg/dL — ABNORMAL HIGH (ref 0–99)
Total CHOL/HDL Ratio: 3 RATIO
Triglycerides: 125 mg/dL (ref <150)
VLDL: 25 mg/dL (ref 0–40)

## 2021-03-31 LAB — ECHOCARDIOGRAM COMPLETE BUBBLE STUDY
Area-P 1/2: 3.27 cm2
S' Lateral: 3.3 cm

## 2021-03-31 LAB — VITAMIN B12: Vitamin B-12: 135 pg/mL — ABNORMAL LOW (ref 180–914)

## 2021-03-31 LAB — HIV ANTIBODY (ROUTINE TESTING W REFLEX): HIV Screen 4th Generation wRfx: NONREACTIVE

## 2021-03-31 LAB — CORTISOL: Cortisol, Plasma: 9.9 ug/dL

## 2021-03-31 MED ORDER — ASPIRIN 300 MG RE SUPP: 300.0000 mg | Freq: Every day | RECTAL | Status: DC

## 2021-03-31 MED ORDER — CYANOCOBALAMIN 1000 MCG/ML IJ SOLN
1000.0000 ug | Freq: Once | INTRAMUSCULAR | Status: AC
  Administered 2021-03-31: 1000 ug via INTRAMUSCULAR
  Filled 2021-03-31: qty 1

## 2021-03-31 MED ORDER — ATORVASTATIN CALCIUM 20 MG PO TABS: 20.0000 mg | ORAL_TABLET | Freq: Every day | ORAL | 2 refills | Status: AC

## 2021-03-31 MED ORDER — CYANOCOBALAMIN 1000 MCG PO TABS
1000.0000 ug | ORAL_TABLET | Freq: Every day | ORAL | 2 refills | Status: AC
Start: 2021-04-01 — End: 2021-06-30

## 2021-03-31 MED ORDER — VITAMIN B-12 1000 MCG PO TABS: 1000.0000 ug | ORAL_TABLET | Freq: Every day | ORAL | Status: DC

## 2021-03-31 MED ORDER — ASPIRIN 81 MG PO CHEW
81.0000 mg | CHEWABLE_TABLET | Freq: Every day | ORAL | Status: DC
  Administered 2021-03-31: 81 mg via ORAL
  Filled 2021-03-31 (×2): qty 1

## 2021-03-31 MED ORDER — FOLIC ACID 1 MG PO TABS
1.0000 mg | ORAL_TABLET | Freq: Every day | ORAL | 2 refills | Status: AC
Start: 2021-03-31 — End: 2021-06-29

## 2021-03-31 NOTE — Procedures (Signed)
Patient Name: Jennifer Franco  MRN: [id number]  Epilepsy Attending: Lora Havens  Referring Physician/Provider: Hetty Blend, NP  Date: 03/31/2021 Duration: 22.27 mins  Patient history: 65 year old female presented with speech disturbance, repeatedly asking same questions.  EEG to evaluate for seizure.  Level of alertness: Awake, drowsy  AEDs during EEG study: None  Technical aspects: This EEG study was done with scalp electrodes positioned according to the 10-20 International system of electrode placement. Electrical activity was acquired at a sampling rate of 500Hz  and reviewed with a high frequency filter of 70Hz  and a low frequency filter of 1Hz . EEG data were recorded continuously and digitally stored.   Description: The posterior dominant rhythm consists of 10 Hz activity of moderate voltage (25-35 uV) seen predominantly in posterior head regions, symmetric and reactive to eye opening and eye closing. Drowsiness was characterized by attenuation of the posterior background rhythm.  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 Hyperventilation and photic stimulation were not performed.     IMPRESSION: This study is within normal limits. No seizures or epileptiform discharges were seen throughout the recording.  [name] [name]

## 2021-03-31 NOTE — Discharge Summary (Signed)
Physician Discharge Summary  Jennifer Franco JQB:[id number] DOB: 10-Nov-1955 DOA: 03/30/2021  PCP: Kelton Pillar, MD  Admit date: 03/30/2021 Discharge date: 03/31/2021  Admitted From: Home Discharge disposition: Home   Code Status: Prior   Discharge Diagnosis:   Principal Problem:   TIA (transient ischemic attack)   Chief Complaint  Patient presents with   Memory Loss    Brief narrative: Jennifer Franco is a 65 y.o. female with PMH significant for Sjogren syndrome on hydroxychloroquine, hypothyroidism, anxiety/depression, fibromyalgia. Patient was brought to the ED on 11/22 from home for memory loss.   Patient was first noticed to have impaired memory 4 months ago around the time when she had appendectomy.  She had some delay in surgical wound healing and started to feel depressed since. Family noticed that she has had frequent mood swings since this summer. This week, patient was found to have disturbed sleeping cycle, episode of intermittent confusion, agitation, somnolence and significantly affected short-term memory.   Patient also reported that her mom has just recently moved into her house, and her mom has poor ambulation and patient herself has been the main caregiver for the last 2 to 3 weeks, she feels depressed and stressed out, but no suicidal ideation.  She lost about 5 pounds this year she attributed for she has been eating healthy food.  She denied any visual or hearing hallucinations.  No new medications.  At baseline, she is able to take care of herself with full scope of daily activity.  In the ED, patient was hemodynamically stable, breathing on room air She was evaluated by telemetry neurology and recommended a brain MRI. Kept on observation under hospitalist service. MRI brain with no acute abnormality MRA head with no significant stenosis.  Subjective: Patient was seen and examined this morning.  Pleasant middle-aged African-American female.   Multiple family members were at bedside.  Neurology team was also at bedside at the time of my evaluation. Chart reviewed Overnight heart rate in 50s. Labs from this morning with HDL 76, LDL 128, vitamin B12 135, A1c 5.8  Assessment/Plan: Transient global amnesia -Presented with progressively worsening memory loss along with disturbed circadian rhythm, intermittent agitation, somnolence while having to take care of her elderly mother -Symptoms are likely somatic expressions of underlying stressors. -Kept on observation to rule out stroke.  MRI brain normal MRA head without significant stenosis -HDL 76, LDL 128.  Lipitor 20 mg daily was recommended. -Low vitamin B12 which may partly be responsible for her symptoms -TSH and cortisol level normal -Echocardiogram with EF 60 to 65%, no regional wall motion abnormality  -Bilateral carotid duplex did not show any significant stenosis -EEG did not show any epileptiform discharge  Vitamin B12 deficiency -Vitamin B12 level low at 135.  Will order IM and oral replacement. -Patient will follow up with neurology as an outpatient for further eval and replacement. Recent Labs    01/20/21 1530 03/30/21 1326 03/31/21 0210  MCV 89.2 86.9  --   VITAMINB12  --   --  135*   Sinus bradycardia -Asymptomatic. -Continue to monitor in telemetry  Hypothyroidism -Continue Synthroid  Sjogren syndrome -On Plaquenil, CBC normal, LFT within normal limits, no vision problems.   Allergies as of 03/31/2021       Reactions   Etodolac Diarrhea   Topamax [topiramate] Other (See Comments)   Brain flutters         Medication List     TAKE these medications    acetaminophen 500  MG tablet Commonly known as: TYLENOL Take 1,000 mg by mouth every 6 (six) hours as needed for mild pain.   atorvastatin 20 MG tablet Commonly known as: Lipitor Take 1 tablet (20 mg total) by mouth daily.   Calcium-Vitamin D 600-200 MG-UNIT tablet Take 1 tablet by mouth  daily.   cyanocobalamin 1000 MCG tablet Take 1 tablet (1,000 mcg total) by mouth daily.   Efinaconazole 10 % Soln Apply 8 mLs topically in the morning and at bedtime. What changed: when to take this   ferrous sulfate 325 (65 FE) MG tablet Take 325 mg by mouth daily with breakfast.   folic acid 1 MG tablet Commonly known as: FOLVITE Take 1 tablet (1 mg total) by mouth daily.   hydroxychloroquine 200 MG tablet Commonly known as: PLAQUENIL Take 200 mg by mouth daily.   ibuprofen 200 MG tablet Commonly known as: ADVIL Take 400 mg by mouth every 8 (eight) hours as needed for moderate pain.   levothyroxine 125 MCG tablet Commonly known as: SYNTHROID Take 125 mcg by mouth daily before breakfast.   multivitamin tablet Take 1 tablet by mouth daily.   ondansetron 4 MG disintegrating tablet Commonly known as: Zofran ODT Take 1 tablet (4 mg total) by mouth every 8 (eight) hours as needed for nausea or vomiting.   VITAMIN D3 PO Take 1 tablet by mouth daily.        Discharge Instructions:  Diet Recommendation:  Discharge Diet Orders (From admission, onward)     Start     Ordered   03/31/21 0000  Diet general       Comments: Diet Orders (From admission, onward)    Start     Ordered   03/30/21 2204  Diet Heart Room service appropriate? Yes; Fluid  consistency: Thin  Diet effective now       Question Answer Comment  Room service appropriate? Yes   Fluid consistency: Thin     03/30/21 2203       03/31/21 1643              @BRDDSCINSTRUCTIONS @  Follow ups:    Follow-up Information     Kelton Pillar, MD Follow up.   Specialty: Family Medicine Contact information: [address] [address] [phone]         Garvin Fila, MD Follow up.   Specialties: Neurology, Radiology Contact information: [address] [place] [address] [phone]                 Wound care:   Incision - 5 Ports  Abdomen Left;Medial;Upper Right;Mid;Upper Right;Mid;Medial Right;Lower;Medial Right;Lower;Lateral (Active)  Placement Date/Time: 08/01/19 0840   Location of Ports: Abdomen  Location Orientation: Left;Medial;Upper  Location Orientation: Right;Mid;Upper  Location Orientation: Right;Mid;Medial  Location Orientation: Right;Lower;Medial  Location Orientation: Ri...    Assessments 08/01/2019 11:00 AM 08/03/2019  8:00 AM  Port 1 Dressing Type Liquid skin adhesive Liquid skin adhesive  Port 1 Dressing Status -- Clean;Dry;Intact  Port 2 Dressing Type Liquid skin adhesive Liquid skin adhesive  Port 2 Dressing Status -- Dry;Intact;Clean  Port 3 Dressing Type Liquid skin adhesive Liquid skin adhesive  Port 3 Dressing Status -- Clean;Dry;Intact  Port 4 Dressing Type Liquid skin adhesive Liquid skin adhesive  Port 4 Dressing Status -- Clean;Dry;Intact  Port 5 Dressing Type Liquid skin adhesive Liquid skin adhesive  Port 5 Dressing Status -- Clean;Dry;Intact     No Linked orders to display  Incision (Closed) 08/01/19 Abdomen Other (Comment) (Active)  Date First Assessed/Time First Assessed: 08/01/19 1153   Location: Abdomen  Location Orientation: Other (Comment)    Assessments 08/01/2019 11:00 AM 08/03/2019  2:07 PM  Dressing Type Liquid skin adhesive;Honeycomb Liquid skin adhesive  Dressing -- Clean;Dry;Intact     No Linked orders to display     Incision - 3 Ports Abdomen 1: Umbilicus 2: Lower;Mid 3: Left (Active)  Placement Date/Time: 01/28/21 1138   Location of Ports: Abdomen  Port: 1:  Location Orientation: Umbilicus  Port: 2:  Location Orientation: Lower;Mid  Port: 3:  Location Orientation: Left    Assessments 01/28/2021 11:37 AM 01/28/2021  1:48 PM  Port 1 Site Assessment -- [employer] 1 Margins -- Attached edges (approximated)  Port 1 Drainage Amount -- None  Port 1 Dressing Type Liquid skin adhesive --  Port 1 Dressing Status -- Clean;Dry;Intact  Port 2 Site Assessment -- [employer] 2 Margins -- Attached edges (approximated)  Port 2 Drainage Amount -- None  Port 2 Dressing Type Liquid skin adhesive --  Port 2 Dressing Status -- Clean;Dry;Intact  Port 3 Dressing Type Liquid skin adhesive --     No Linked orders to display    Discharge Exam:   Vitals:   03/31/21 0420 03/31/21 0635 03/31/21 0729 03/31/21 1208  BP: 137/64 130/72 133/67 133/74  Pulse: (!) 52 (!) 53 (!) 47 65  Resp: 16 16 14 14   Temp: 98.9 F (37.2 C) 99 F (37.2 C) 99.1 F (37.3 C) 98.4 F (36.9 C)  TempSrc: Oral Oral Oral Oral  SpO2: 99% 100% 99% 100%  Weight:      Height:        Body mass index is 29.17 kg/m.  General exam: Pleasant, middle-aged African-American female.  Not in physical distress Skin: No rashes, lesions or ulcers. HEENT: Atraumatic, normocephalic, no obvious bleeding Lungs: Clear to auscultation bilaterally CVS: Regular rate and rhythm, no murmur GI/Abd soft, nontender, nondistended, bowel sound present CNS: Alert, awake, oriented x3 Psychiatry: Mood appropriate Extremities: No pedal edema, no calf tenderness  Time coordinating discharge: 35 minutes   The results of significant diagnostics from this hospitalization (including imaging, microbiology, ancillary and laboratory) are listed below for reference.    Procedures and Diagnostic Studies:   CT Head Wo Contrast  Result Date: 03/30/2021 CLINICAL DATA:  Altered mental status EXAM: CT HEAD WITHOUT CONTRAST TECHNIQUE: Contiguous axial images were obtained from the base of the skull through the vertex without intravenous contrast. COMPARISON:  None. FINDINGS: Brain: No acute intracranial hemorrhage, mass effect, or herniation. No extra-axial fluid collections. No evidence of acute territorial infarct. No hydrocephalus. Mild cortical volume loss. Vascular: No hyperdense vessel or unexpected calcification. Skull: Normal. Negative for fracture or focal lesion. Sinuses/Orbits: No acute finding. Other: None.  IMPRESSION: No acute intracranial process identified. Electronically Signed   By: Ofilia Neas M.D.   On: 03/30/2021 14:03   MR ANGIO HEAD WO CONTRAST  Result Date: 03/31/2021 CLINICAL DATA:  Altered mental status.  Confusion last night. EXAM: MRA HEAD WITHOUT CONTRAST TECHNIQUE: Angiographic images of the Circle of Willis were acquired using MRA technique without intravenous contrast. COMPARISON:  Brain MRI from yesterday FINDINGS: Anterior circulation: Left ICA tortuosity below the skull base. No branch occlusion, beading, or aneurysm. Posterior circulation: Small vertebral and basilar arteries in the setting of fetal type bilateral PCA flow. Dominant right vertebral artery with most of left vertebral flow to the left PICA. Dominant right AICA.  Negative for aneurysm, branch occlusion, beading, or vascular malformation. IMPRESSION: No emergent finding or flow limiting stenosis. Electronically Signed   By: Jorje Guild M.D.   On: 03/31/2021 06:38   MR BRAIN WO CONTRAST  Result Date: 03/30/2021 CLINICAL DATA:  Transient ischemic attack EXAM: MRI HEAD WITHOUT CONTRAST TECHNIQUE: Multiplanar, multiecho pulse sequences of the brain and surrounding structures were obtained without intravenous contrast. COMPARISON:  No prior MRI, correlation is made with CT head 03/30/2021 FINDINGS: Brain: No restricted diffusion to suggest acute or subacute infarction. No acute hemorrhage, mass, mass effect, or midline shift. Scattered T2 hyperintense signal in the periventricular white matter, likely the sequela of chronic small vessel ischemic disease. No foci of hemosiderin deposition to suggest remote hemorrhage. Dilated perivascular spaces. Vascular: Patent flow voids. Skull and upper cervical spine: Normal marrow signal. Sinuses/Orbits: Negative. Other: None. IMPRESSION: No acute intracranial process. Electronically Signed   By: Merilyn Baba M.D.   On: 03/30/2021 20:48   DG Chest Portable 1 View  Result Date:  03/30/2021 CLINICAL DATA:  Altered mental status EXAM: PORTABLE CHEST 1 VIEW COMPARISON:  11/05/2012 FINDINGS: The heart size and mediastinal contours are within normal limits. Both lungs are clear. The visualized skeletal structures are unremarkable. IMPRESSION: No acute abnormality of the lungs in AP portable projection. Electronically Signed   By: Delanna Ahmadi M.D.   On: 03/30/2021 13:47   VAS US CAROTID  Result Date: 03/31/2021 Carotid Arterial Duplex Study Patient Name:  Jennifer Franco  Date of Exam:   03/31/2021 Medical Rec #: [id number]               Accession #:    [id number] Date of Birth: [birth year]                Patient Gender: F Patient Age:   71 years Exam Location:  [facility] Procedure:      VAS US CAROTID Referring Phys: Alferd Patee [facility] --------------------------------------------------------------------------------  Indications:       CVA. Comparison Study:  no prior Performing Technologist: Archie Patten RVS  Examination Guidelines: A complete evaluation includes B-mode imaging, spectral Doppler, color Doppler, and power Doppler as needed of all accessible portions of each vessel. Bilateral testing is considered an integral part of a complete examination. Limited examinations for reoccurring indications may be performed as noted.  Right Carotid Findings: +----------+--------+--------+--------+------------------+--------+           PSV cm/sEDV cm/sStenosisPlaque DescriptionComments +----------+--------+--------+--------+------------------+--------+ CCA Prox  100     16              heterogenous               +----------+--------+--------+--------+------------------+--------+ CCA Distal88      19              heterogenous               +----------+--------+--------+--------+------------------+--------+ ICA Prox  70      21      1-39%   heterogenous               +----------+--------+--------+--------+------------------+--------+ ICA Distal68       19                                         +----------+--------+--------+--------+------------------+--------+ ECA       151     18                                         +----------+--------+--------+--------+------------------+--------+ +----------+--------+-------+--------+-------------------+  PSV cm/sEDV cmsDescribeArm Pressure (mmHG) +----------+--------+-------+--------+-------------------+ [id number]                                        +----------+--------+-------+--------+-------------------+ +---------+--------+--+--------+--+---------+ VertebralPSV cm/s43EDV cm/s11Antegrade +---------+--------+--+--------+--+---------+  Left Carotid Findings: +----------+--------+--------+--------+------------------+--------+           PSV cm/sEDV cm/sStenosisPlaque DescriptionComments +----------+--------+--------+--------+------------------+--------+ CCA Prox  116     17              heterogenous               +----------+--------+--------+--------+------------------+--------+ CCA Distal75      18              heterogenous               +----------+--------+--------+--------+------------------+--------+ ICA Prox  102     23      1-39%   heterogenous               +----------+--------+--------+--------+------------------+--------+ ICA Distal81      19                                         +----------+--------+--------+--------+------------------+--------+ ECA       99      17                                         +----------+--------+--------+--------+------------------+--------+ +----------+--------+--------+--------+-------------------+           PSV cm/sEDV cm/sDescribeArm Pressure (mmHG) +----------+--------+--------+--------+-------------------+ Subclavian140                                         +----------+--------+--------+--------+-------------------+ +---------+--------+--+--------+--+---------+  VertebralPSV cm/s73EDV cm/s21Antegrade +---------+--------+--+--------+--+---------+   Summary: Right Carotid: Velocities in the right ICA are consistent with a 1-39% stenosis. Left Carotid: Velocities in the left ICA are consistent with a 1-39% stenosis. Vertebrals: Bilateral vertebral arteries demonstrate antegrade flow. *See table(s) above for measurements and observations.     Preliminary      Labs:   Basic Metabolic Panel: Recent Labs  Lab 03/30/21 1326  NA 140  K 3.4*  CL 103  CO2 25  GLUCOSE 158*  BUN 12  CREATININE 0.71  CALCIUM 10.3   GFR Estimated Creatinine Clearance: 69.7 mL/min (by C-G formula based on SCr of 0.71 mg/dL). Liver Function Tests: Recent Labs  Lab 03/30/21 1326  AST 12*  ALT 9  ALKPHOS 51  BILITOT 0.5  PROT 7.3  ALBUMIN 4.7   No results for input(s): LIPASE, AMYLASE in the last 168 hours. No results for input(s): AMMONIA in the last 168 hours. Coagulation profile No results for input(s): INR, PROTIME in the last 168 hours.  CBC: Recent Labs  Lab 03/30/21 1326  WBC 6.5  NEUTROABS 5.6  HGB 13.1  HCT 39.8  MCV 86.9  PLT 289   Cardiac Enzymes: No results for input(s): CKTOTAL, CKMB, CKMBINDEX, TROPONINI in the last 168 hours. BNP: Invalid input(s): POCBNP CBG: No results for input(s): GLUCAP in the last 168 hours. D-Dimer No results for input(s): DDIMER in the last 72 hours. Hgb A1c Recent Labs    03/31/21 0210  HGBA1C 5.8*  Lipid Profile Recent Labs    03/31/21 0210  CHOL 229*  HDL 76  LDLCALC 128*  TRIG 125  CHOLHDL 3.0   Thyroid function studies Recent Labs    03/30/21 1325  TSH 1.298   Anemia work up Recent Labs    03/31/21 0210  VITAMINB12 135*   Microbiology Recent Results (from the past 240 hour(s))  Resp Panel by RT-PCR (Flu A&B, Covid) Nasopharyngeal Swab     Status: None   Collection Time: 03/30/21  1:26 PM   Specimen: Nasopharyngeal Swab; Nasopharyngeal(NP) swabs in vial transport medium   Result Value Ref Range Status   SARS Coronavirus 2 by RT PCR NEGATIVE NEGATIVE Final    Comment: (NOTE) SARS-CoV-2 target nucleic acids are NOT DETECTED.  The SARS-CoV-2 RNA is generally detectable in upper respiratory specimens during the acute phase of infection. The lowest concentration of SARS-CoV-2 viral copies this assay can detect is 138 copies/mL. A negative result does not preclude SARS-Cov-2 infection and should not be used as the sole basis for treatment or other patient management decisions. A negative result may occur with  improper specimen collection/handling, submission of specimen other than nasopharyngeal swab, presence of viral mutation(s) within the areas targeted by this assay, and inadequate number of viral copies(<138 copies/mL). A negative result must be combined with clinical observations, patient history, and epidemiological information. The expected result is Negative.  Fact Sheet for Patients:  EntrepreneurPulse.com.au  Fact Sheet for Healthcare Providers:  IncredibleEmployment.be  This test is no t yet approved or cleared by the [place] FDA and  has been authorized for detection and/or diagnosis of SARS-CoV-2 by FDA under an Emergency Use Authorization (EUA). This EUA will remain  in effect (meaning this test can be used) for the duration of the COVID-19 declaration under Section 564(b)(1) of the Act, 21 U.S.C.section 360bbb-3(b)(1), unless the authorization is terminated  or revoked sooner.       Influenza A by PCR NEGATIVE NEGATIVE Final   Influenza B by PCR NEGATIVE NEGATIVE Final    Comment: (NOTE) The Xpert Xpress SARS-CoV-2/FLU/RSV plus assay is intended as an aid in the diagnosis of influenza from Nasopharyngeal swab specimens and should not be used as a sole basis for treatment. Nasal washings and aspirates are unacceptable for Xpert Xpress SARS-CoV-2/FLU/RSV testing.  Fact Sheet for  Patients: EntrepreneurPulse.com.au  Fact Sheet for Healthcare Providers: IncredibleEmployment.be  This test is not yet approved or cleared by the [place] FDA and has been authorized for detection and/or diagnosis of SARS-CoV-2 by FDA under an Emergency Use Authorization (EUA). This EUA will remain in effect (meaning this test can be used) for the duration of the COVID-19 declaration under Section 564(b)(1) of the Act, 21 U.S.C. section 360bbb-3(b)(1), unless the authorization is terminated or revoked.  Performed at [facility], [address] [place], [place] [address]      Signed: Terrilee Croak  [facility] 04/01/2021, 11:56 AM

## 2021-03-31 NOTE — Discharge Instructions (Signed)
Medicare Outpatient Observation Notice   Patient name:  Jennifer Franco Patient number:  [id number]                                                                                                                                                                       You're a hospital outpatient receiving observation services. You are not an inpatient because:    TIA   You require hospital care for evaluation and/or treatment.  It is expected you will need hospital care for less than a total of two days.                                                                                                                                                                         Being an outpatient may affect what you pay in a hospital:   When you're a hospital outpatient, your observation stay is covered under Medicare Part B.   For Part B services, you generally pay:   A copayment for each outpatient hospital service you get. Part B copayments may vary by type of service.   20% of the Medicare-approved amount for most doctor services, after the Part B deductible.   Observation services may affect coverage and payment of your care after you leave the hospital:     If you need skilled nursing facility (SNF) care after you leave the hospital, Medicare Part A will only cover SNF care if you've had a 3-day minimum, medically necessary, inpatient hospital stay for a related illness or injury. An inpatient hospital stay begins the day the hospital admits you as an inpatient based on a doctor's order and doesn't include the day you're discharged.   If you have Medicaid, a Medicare Advantage plan or other health plan, Medicaid or the plan may have different rules for SNF coverage after you leave the hospital. Check with Medicaid or your plan.   NOTE: Medicare Part A generally doesn't cover outpatient hospital services, like  an observation stay. However, Part A will generally cover medically  necessary inpatient services if the hospital admits you as an inpatient based on a doctor's order. In most cases, you'll pay a one-time deductible for all of your inpatient hospital services for the first 60 days you're in a hospital.                                                                                                                                                                      If you have any questions about your observation services, ask the hospital staff member giving you this notice or the doctor providing your hospital care. You can also ask to speak with someone from the hospital's utilization or discharge planning department.   You can also call 1-800-MEDICARE ([phone] [phone]).  TTY users should call [phone].   Form CMS 19509-TOIZ   Expiration 05/08/2021 OMB APPROVAL [id number]          Your costs for medications:     Generally, prescription and over-the-counter drugs, including "self-administered drugs," you get in a hospital outpatient setting (like an emergency department) aren't covered by Part B. "Self- administered drugs" are drugs you'd normally take on your own. For safety reasons, many hospitals don't allow you to take medications brought from home. If you have a Medicare prescription drug plan (Part D), your plan may help you pay for these drugs. You'll likely need to pay out-of- pocket for these drugs and submit a claim to your drug plan for a refund. Contact your drug plan for more information.                                                                                                                                                                        If you're enrolled in a Medicare Advantage plan (like an HMO or PPO) or other Medicare health plan (Part C), your costs and coverage may be different. Check with your plan to find out about coverage for  outpatient observation services.    If you're a Qualified Medicare Beneficiary through your  state Medicaid program, you can't be billed for Part A or Part B deductibles, coinsurance, and copayments.                                                                                                                                                                      Additional Information (Optional):                                                                                                                                                                                Please sign below to show you received and understand this notice.                                         Date: 03/31/21 / Time:10:14 AM   CMS does not discriminate in its programs and activities. To request this publication in alternative format, please call: 1-800-MEDICARE or email:[email or web address].   Form CMS 10258-NIDP   Expiration 05/08/2021 OMB APPROVAL [id number]

## 2021-03-31 NOTE — Consult Note (Signed)
[facility] Face-to-Face Psychiatry Consult   Reason for Consult:  Depression Referring Physician:  Wynetta Fines, MD Patient Identification: Jennifer Franco MRN:  [id number] Principal Diagnosis: TIA (transient ischemic attack) Diagnosis:  Principal Problem:   TIA (transient ischemic attack)   Total Time spent with patient: 45 minutes  Subjective:   Jennifer Franco is a 65 y.o. female patient admitted with TIA.  HPI:  Patient seen this AM with her son in daughter in the room. Patient endorsed feeling comfortable with them remained during assessment. Patient endorsed that she is not surprised psychiatry has come. Patient reports that she has been  feeling overwhelmed by taking care of elderly mother, w/ dementia. Patient's son clarifies that the patient has been taking care of her mother for 4 years. 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 Patient reports that her mother is her biggest stressor and endorses that it has been most difficult watching her mother's decline. Patient reports that she recently found out that her mother has not been participating in card games for quite some time, and patient endorsed that this a psychological blow, because it meant to her that her mother has significantly declined, "that piece of her is gone."   Patient's daughter reports she lives with the patient. Daughter and patient endorse that the patient appears to be sleeping more. Patient endorses anhedonia. Daughter reports that she has also noticed patient's appetite appears to be decreasing. Patient denies feelings of guilt, hopelessness, worthlessness and change in concentration (prior to TIA). Patient denied SI, HI and AVH. Patient endorsed that she has had some GI symptoms over the past year, that may or may not be related to her stress. Patient's children endorse that they feel that the patient has appeared more anxious and worried over the past year. Daughter  reported that she has noticed her mom having a pattern of seeming to have more energy for a few days and the "crashing and she sleeps and cries more." Son endorsed that he has noted that his mother cries more in the morning.   Past Psychiatric History: sees OP psychologist  Risk to Self:  NO Risk to Others:  NO Prior Inpatient Therapy: NO  Prior Outpatient Therapy:  Yes, Psychology  Past Medical History:  Past Medical History:  Diagnosis Date   Anemia    Anginal pain (HCC)    anxiety   Anxiety    Cancer ([place])    bladder   Chills    CTS (carpal tunnel syndrome)    LEFT   Cyclical vomiting    Depression    Diarrhea, unspecified    Diverticulitis    Fibromyalgia    Hemorrhoids    History of adenomatous polyp of colon    History of morbid obesity    Hypothyroid    Hypothyroidism    Intractable nausea and vomiting    Irregular bowel habits    Joint pain    LLQ abdominal pain    Morbid obesity (HCC)    Nausea and vomiting    Onychomycosis    RA (rheumatoid arthritis) (HCC)    Reflux esophagitis    Reflux esophagitis    Sjogren's syndrome (HCC)    Tinea corporis    Unspecified mood (affective) disorder (HCC)    Weight loss     Past Surgical History:  Procedure Laterality Date   ADENOIDECTOMY     CARPAL TUNNEL RELEASE Right    CESAREAN SECTION  CESAREAN SECTION     CYSTOSCOPY WITH STENT PLACEMENT N/A 08/01/2019   Procedure: CYSTOSCOPY WITH URETHRAL DILATION, BILATERAL RETROGRADE PYELOGRAMS, TRANSURETHRAL RESECTION OF SMALL BLADDER TUMOR, INSERTION OF BILATERAL OPEN ENDED CATHETERS WITH BILATERAL INJECTION OF FIREFLY;  Surgeon: Irine Seal, MD;  Location: [facility];  Service: Urology;  Laterality: N/A;   FLEXIBLE SIGMOIDOSCOPY N/A 08/01/2019   Procedure: FLEXIBLE SIGMOIDOSCOPY;  Surgeon: Ileana Roup, MD;  Location: [facility];  Service: General;  Laterality: N/A;   LAPAROSCOPIC APPENDECTOMY N/A 01/28/2021   Procedure: APPENDECTOMY LAPAROSCOPIC;  Surgeon: Ileana Roup, MD;  Location: [facility];  Service: General;  Laterality: N/A;   wisdom teeth     Family History:  Family History  Problem Relation Age of Onset   Glaucoma Mother    GER disease Mother    Colon polyps Mother    Breast cancer Other 26       Maternal Great Grandmother   Osteoarthritis Father    Alzheimer's disease Father    Colon polyps Maternal Grandmother    Colon polyps Maternal Grandfather    Family Psychiatric  History: Son is bipolar currently doing well with Abilify, Prazosin, and Lexapro).. son endorsed that his father's family had other Bipolar family members Social History:  Social History   Substance and Sexual Activity  Alcohol Use Yes   Comment: rare     Social History   Substance and Sexual Activity  Drug Use No    Social History   Socioeconomic History   Marital status: Divorced    Spouse name: Not on file   Number of children: Not on file   Years of education: Not on file   Highest education level: Not on file  Occupational History   Not on file  Tobacco Use   Smoking status: Former   Smokeless tobacco: Never  Vaping Use   Vaping Use: Never used  Substance and Sexual Activity   Alcohol use: Yes    Comment: rare   Drug use: No   Sexual activity: Not on file  Other Topics Concern   Not on file  Social History Narrative   Not on file   Social Determinants of Health   Financial Resource Strain: Not on file  Food Insecurity: Not on file  Transportation Needs: Not on file  Physical Activity: Not on file  Stress: Not on file  Social Connections: Not on file   Additional Social History:    Allergies:   Allergies  Allergen Reactions   Etodolac Diarrhea   Topamax [Topiramate] Other (See Comments)    Brain flutters     Labs:  Results for orders placed or performed during the hospital encounter of 03/30/21 (from the past 48 hour(s))  TSH     Status: None   Collection Time: 03/30/21  1:25 PM  Result Value Ref Range   TSH  1.298 0.350 - 4.500 uIU/mL    Comment: Performed by a 3rd Generation assay with a functional sensitivity of <=0.01 uIU/mL. Performed at [facility], [place] [address], [place], [place] [address]   Comprehensive metabolic panel     Status: Abnormal   Collection Time: 03/30/21  1:26 PM  Result Value Ref Range   Sodium 140 135 - 145 mmol/L   Potassium 3.4 (L) 3.5 - 5.1 mmol/L   Chloride 103 98 - 111 mmol/L   CO2 25 22 - 32 mmol/L   Glucose, Bld 158 (H) 70 - 99 mg/dL    Comment: Glucose reference range  applies only to samples taken after fasting for at least 8 hours.   BUN 12 8 - 23 mg/dL   Creatinine, Ser 0.71 0.44 - 1.00 mg/dL   Calcium 10.3 8.9 - 10.3 mg/dL   Total Protein 7.3 6.5 - 8.1 g/dL   Albumin 4.7 3.5 - 5.0 g/dL   AST 12 (L) 15 - 41 U/L   ALT 9 0 - 44 U/L   Alkaline Phosphatase 51 38 - 126 U/L   Total Bilirubin 0.5 0.3 - 1.2 mg/dL   GFR, Estimated >60 >60 mL/min    Comment: (NOTE) Calculated using the CKD-EPI Creatinine Equation (2021)    Anion gap 12 5 - 15    Comment: Performed at [facility], [address], [place], [place] [address]  CBC with Differential     Status: Abnormal   Collection Time: 03/30/21  1:26 PM  Result Value Ref Range   WBC 6.5 4.0 - 10.5 K/uL   RBC 4.58 3.87 - 5.11 MIL/uL   Hemoglobin 13.1 12.0 - 15.0 g/dL   HCT 39.8 36.0 - 46.0 %   MCV 86.9 80.0 - 100.0 fL   MCH 28.6 26.0 - 34.0 pg   MCHC 32.9 30.0 - 36.0 g/dL   RDW 14.3 11.5 - 15.5 %   Platelets 289 150 - 400 K/uL   nRBC 0.0 0.0 - 0.2 %   Neutrophils Relative % 86 %   Neutro Abs 5.6 1.7 - 7.7 K/uL   Lymphocytes Relative 9 %   Lymphs Abs 0.6 (L) 0.7 - 4.0 K/uL   Monocytes Relative 5 %   Monocytes Absolute 0.3 0.1 - 1.0 K/uL   Eosinophils Relative 0 %   Eosinophils Absolute 0.0 0.0 - 0.5 K/uL   Basophils Relative 0 %   Basophils Absolute 0.0 0.0 - 0.1 K/uL   Immature Granulocytes 0 %   Abs Immature Granulocytes 0.02 0.00 - 0.07 K/uL    Comment: Performed at  [facility], [address], [place], [place] [address]  Resp Panel by RT-PCR (Flu A&B, Covid) Nasopharyngeal Swab     Status: None   Collection Time: 03/30/21  1:26 PM   Specimen: Nasopharyngeal Swab; Nasopharyngeal(NP) swabs in vial transport medium  Result Value Ref Range   SARS Coronavirus 2 by RT PCR NEGATIVE NEGATIVE    Comment: (NOTE) SARS-CoV-2 target nucleic acids are NOT DETECTED.  The SARS-CoV-2 RNA is generally detectable in upper respiratory specimens during the acute phase of infection. The lowest concentration of SARS-CoV-2 viral copies this assay can detect is 138 copies/mL. A negative result does not preclude SARS-Cov-2 infection and should not be used as the sole basis for treatment or other patient management decisions. A negative result may occur with  improper specimen collection/handling, submission of specimen other than nasopharyngeal swab, presence of viral mutation(s) within the areas targeted by this assay, and inadequate number of viral copies(<138 copies/mL). A negative result must be combined with clinical observations, patient history, and epidemiological information. The expected result is Negative.  Fact Sheet for Patients:  EntrepreneurPulse.com.au  Fact Sheet for Healthcare Providers:  IncredibleEmployment.be  This test is no t yet approved or cleared by the [place] FDA and  has been authorized for detection and/or diagnosis of SARS-CoV-2 by FDA under an Emergency Use Authorization (EUA). This EUA will remain  in effect (meaning this test can be used) for the duration of the COVID-19 declaration under Section 564(b)(1) of the Act, 21 U.S.C.section 360bbb-3(b)(1), unless the authorization is terminated  or revoked sooner.       Influenza A by PCR NEGATIVE NEGATIVE   Influenza B by PCR NEGATIVE NEGATIVE    Comment: (NOTE) The Xpert Xpress SARS-CoV-2/FLU/RSV plus assay is intended as  an aid in the diagnosis of influenza from Nasopharyngeal swab specimens and should not be used as a sole basis for treatment. Nasal washings and aspirates are unacceptable for Xpert Xpress SARS-CoV-2/FLU/RSV testing.  Fact Sheet for Patients: EntrepreneurPulse.com.au  Fact Sheet for Healthcare Providers: IncredibleEmployment.be  This test is not yet approved or cleared by the [place] FDA and has been authorized for detection and/or diagnosis of SARS-CoV-2 by FDA under an Emergency Use Authorization (EUA). This EUA will remain in effect (meaning this test can be used) for the duration of the COVID-19 declaration under Section 564(b)(1) of the Act, 21 U.S.C. section 360bbb-3(b)(1), unless the authorization is terminated or revoked.  Performed at [facility], [address] [place], [place] [address]   HIV Antibody (routine testing w rflx)     Status: None   Collection Time: 03/31/21  2:10 AM  Result Value Ref Range   HIV Screen 4th Generation wRfx Non Reactive Non Reactive    Comment: Performed at [facility], [address], [place], [place] [address]  Hemoglobin A1c     Status: Abnormal   Collection Time: 03/31/21  2:10 AM  Result Value Ref Range   Hgb A1c MFr Bld 5.8 (H) 4.8 - 5.6 %    Comment: (NOTE) Pre diabetes:          5.7%-6.4%  Diabetes:              >6.4%  Glycemic control for   <7.0% adults with diabetes    Mean Plasma Glucose 119.76 mg/dL    Comment: Performed at [facility] [address] [place], [place] [address]  Lipid panel     Status: Abnormal   Collection Time: 03/31/21  2:10 AM  Result Value Ref Range   Cholesterol 229 (H) 0 - 200 mg/dL   Triglycerides 125 <150 mg/dL   HDL 76 >40 mg/dL   Total CHOL/HDL Ratio 3.0 RATIO   VLDL 25 0 - 40 mg/dL   LDL Cholesterol 128 (H) 0 - 99 mg/dL    Comment:        Total Cholesterol/HDL:CHD Risk Coronary Heart Disease Risk Table                      Men   Women  1/2 Average Risk   3.4   3.3  Average Risk       5.0   4.4  2 X Average Risk   9.6   7.1  3 X Average Risk  23.4   11.0        Use the calculated Patient Ratio above and the CHD Risk Table to determine the patient's CHD Risk.        ATP III CLASSIFICATION (LDL):  <100     mg/dL   Optimal  100-129  mg/dL   Near or Above                    Optimal  130-159  mg/dL   Borderline  160-189  mg/dL   High  >190     mg/dL   Very High Performed at [facility] [address] [place], [place] [address]   Vitamin B12     Status: Abnormal  Collection Time: 03/31/21  2:10 AM  Result Value Ref Range   Vitamin B-12 135 (L) 180 - 914 pg/mL    Comment: (NOTE) This assay is not validated for testing neonatal or myeloproliferative syndrome specimens for Vitamin B12 levels. Performed at [facility], [place] [address] [place], [place] [address]   Cortisol     Status: None   Collection Time: 03/31/21  2:10 AM  Result Value Ref Range   Cortisol, Plasma 9.9 ug/dL    Comment: (NOTE) AM    6.7 - 22.6 ug/dL PM   <10.0       ug/dL Performed at [facility] [address], [place], [place] [address]     Current Facility-Administered Medications  Medication Dose Route Frequency Provider Last Rate Last Admin   acetaminophen (TYLENOL) tablet 650 mg  650 mg Oral Q4H PRN Wynetta Fines T, MD       Or   acetaminophen (TYLENOL) 160 MG/5ML solution 650 mg  650 mg Per Tube Q4H PRN Wynetta Fines T, MD       Or   acetaminophen (TYLENOL) suppository 650 mg  650 mg Rectal Q4H PRN Wynetta Fines T, MD       aspirin chewable tablet 81 mg  81 mg Oral Daily Donnetta Simpers, MD   81 mg at 03/31/21 0443   Or   aspirin suppository 300 mg  300 mg Rectal Daily Donnetta Simpers, MD       calcium-vitamin D (OSCAL WITH D) 500-5 MG-MCG per tablet 1 tablet  1 tablet Oral Daily Wynetta Fines T, MD   1 tablet at 03/31/21 0935   enoxaparin (LOVENOX) injection 40 mg  40 mg  Subcutaneous Q24H Wynetta Fines T, MD   40 mg at 40/81/44 8185   folic acid (FOLVITE) tablet 1 mg  1 mg Oral Daily Wynetta Fines T, MD   1 mg at 03/31/21 6314   hydroxychloroquine (PLAQUENIL) tablet 200 mg  200 mg Oral Daily Wynetta Fines T, MD   200 mg at 03/31/21 0935   ibuprofen (ADVIL) tablet 400 mg  400 mg Oral Q8H PRN Wynetta Fines T, MD       iron polysaccharides (NIFEREX) capsule 150 mg  150 mg Oral Daily Wynetta Fines T, MD   150 mg at 03/31/21 9702   levothyroxine (SYNTHROID) tablet 125 mcg  125 mcg Oral QAC breakfast Lequita Halt, MD   125 mcg at 03/31/21 0631   [START ON 04/01/2021] vitamin B-12 (CYANOCOBALAMIN) tablet 1,000 mcg  1,000 mcg Oral Daily Dahal, Marlowe Aschoff, MD         Psychiatric Specialty Exam:  Presentation  General Appearance: Appropriate for Environment Eye Contact:Good Speech:Clear and Coherent Speech Volume:Normal Handedness:No data recorded  Mood and Affect  Mood:Anxious; Dysphoric Affect:Congruent  Thought Process  Thought Processes:Coherent Descriptions of Associations:Intact Orientation:Full (Time, Place and Person) Thought Content:Logical History of Schizophrenia/Schizoaffective disorder:No data recorded Duration of Psychotic Symptoms:No data recorded Hallucinations:Hallucinations: None Ideas of Reference:None Suicidal Thoughts:Suicidal Thoughts: No Homicidal Thoughts:Homicidal Thoughts: No  Sensorium  Memory:Immediate Good; Recent Good; Remote Good Judgment:Fair Insight:Fair  Executive Functions  Concentration:Good Attention Span:Good Recall:No data recorded Fund of Knowledge:Good Language:Good  Psychomotor Activity  Psychomotor Activity:Psychomotor Activity: Normal  Assets  Assets:Communication Skills; Housing; Data processing manager; Resilience; Desire for Improvement  Sleep  Sleep:Sleep: Fair  Physical Exam: Physical Exam Constitutional:      Appearance: Normal appearance.  HENT:     Head: Normocephalic and atraumatic.  Eyes:      Extraocular  Movements: Extraocular movements intact.  Pulmonary:     Effort: Pulmonary effort is normal.  Skin:    General: Skin is dry.  Neurological:     Mental Status: She is alert and oriented to person, place, and time.   Review of Systems  Psychiatric/Behavioral:  Positive for depression. Negative for hallucinations and suicidal ideas.   Blood pressure 133/74, pulse 65, temperature 98.4 F (36.9 C), temperature source Oral, resp. rate 14, height 5\' 3"  (1.6 m), weight 74.7 kg, SpO2 100 %. Body mass index is 29.17 kg/m.  Treatment Plan Summary: Jennifer Franco is a 65 y.o. female admitted after a TIA. Psychiatry was consulted due to reported depression. On assessment patient does appear to screen positive for MDD. Patient has significant stressors in her life and also appears to have lost a considerable amount of weight. Although patient endorsed that this was intentional patient's family seemed to be concerned that this may not be entirely the case. Patient has been receiving therapy but continues to have concerning symptoms of depression and would benefit from psychotropic intervention. Patient appeared opened to this. Would recommend Lexapro as her son has already responded well to this. There is some concern that patient may have underlying Bipolar, based off her daughter's description of patient's episodes of brief increase in energy and suddenly crashing; however it also possible that this is representative of her depression. It would be unlikely that the patient would have first hypomanic episode at 17-63 yo. Although her FH is positve with her son, he may have been at increased risk genetically due to father and not his mother. Due to this and the dispo of patient going home today, starting patient on medication was not recommended. Patient would require monitoring or f/u. Patient was counseled on this as well as life style modification such as mediterranean diet to help with her  metabolic concerns but also decrease unhealthy weight loss habits. Patient was noted to have severely low B12 indicated that she has some malnourishment.   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   Disposition: Per primary   PGY-2 Freida Busman, MD 03/31/2021 3:04 PM

## 2021-03-31 NOTE — Evaluation (Signed)
Physical Therapy Evaluation Patient Details Name: Jennifer Franco MRN: [id number] DOB: [birth year] Today's Date: 03/31/2021  History of Present Illness  Jennifer Franco is a 65 y.o. female with medical history significant of Sjogren syndrome on hydroxychloroquine, hypothyroidism, presented with worsening of memory loss.  MRi negative.  Clinical Impression  Patient presents with mobility close to baseline.  Has had L hip pain for some time and limps around doing what she has to do to take care of her mom and herself.  She denies falls and reports has medications for pain but does not want to take them, only uses OTC occasionally.  Educated on options for anti-inflammatory meds and topical analgesics.  She has sufficient support, but now more apt to ask for help since she has  "had a stroke".  She is at baseline for mobility and don't think she would need follow up PT for her stroke symptoms.  However, could consider PCP referral to outpatient PT for trial to help with her hip pain.  PT to sign off.  No current follow up needs at this time.      Recommendations for follow up therapy are one component of a multi-disciplinary discharge planning process, led by the attending physician.  Recommendations may be updated based on patient status, additional functional criteria and insurance authorization.  Follow Up Recommendations No PT follow up    Assistance Recommended at Discharge PRN  Functional Status Assessment Patient has not had a recent decline in their functional status  Equipment Recommendations  None recommended by PT    Recommendations for Other Services       Precautions / Restrictions Precautions Precautions: Fall      Mobility  Bed Mobility Overal bed mobility: Modified Independent                  Transfers Overall transfer level: Modified independent                      Ambulation/Gait Ambulation/Gait assistance: Modified independent  (Device/Increase time) Gait Distance (Feet): 200 Feet Assistive device: None Gait Pattern/deviations: Step-through pattern;Antalgic;Decreased stance time - left       General Gait Details: ambulated in hallway no device unaided but limps on L with decreased stance time and pain in L hip; reports baseline  Stairs Stairs: Yes   Stair Management: One rail Right;Alternating pattern;Step to pattern;Forwards Number of Stairs: 6 General stair comments: step to pattern is normal for her due to hip pain, but demonstrates she can also do step through if needed  Wheelchair Mobility    Modified Rankin (Stroke Patients Only) Modified Rankin (Stroke Patients Only) Pre-Morbid Rankin Score: No significant disability Modified Rankin: Slight disability     Balance Overall balance assessment: Needs assistance   Sitting balance-Leahy Scale: Good     Standing balance support: No upper extremity supported Standing balance-Leahy Scale: Good                               Pertinent Vitals/Pain Pain Assessment: 0-10 Pain Score: 6  Pain Location: L hip when lifting the leg Pain Descriptors / Indicators: Aching Pain Intervention(s): Monitored during session    Home Living Family/patient expects to be discharged to:: Private residence Living Arrangements: Children;Parent (takes care of her mom) Available Help at Discharge: Family Type of Home: House Home Access: Stairs to enter Entrance Stairs-Rails: Right Entrance Stairs-Number of Steps: 4 or 8 Alternate Level  Stairs-Number of Steps: flight Home Layout: Two level;Bed/bath upstairs Home Equipment: Rolling Walker (2 wheels);Cane - single point Additional Comments: has various walkers and canes and w/c for her mom    Prior Function Prior Level of Function : Independent/Modified Independent;Driving             Mobility Comments: Pain in L hip chronic and likely needs surgery limps at baseline, no falls, has to help her mom  up       Hand Dominance   Dominant Hand: Right    Extremity/Trunk Assessment   Upper Extremity Assessment Upper Extremity Assessment: Defer to OT evaluation    Lower Extremity Assessment Lower Extremity Assessment: LLE deficits/detail LLE Deficits / Details: limited hip flexion in sitting due to pain, hip flexion strength 2-/5, knee extension 4-/5 w/ pain @ hip, ankle DF 4+/5 LLE Coordination: decreased gross motor (due to pain in hip)    Cervical / Trunk Assessment Cervical / Trunk Assessment: Normal  Communication   Communication: No difficulties  Cognition Arousal/Alertness: Awake/alert Behavior During Therapy: WFL for tasks assessed/performed Overall Cognitive Status: Within Functional Limits for tasks assessed                                 General Comments: little tearful explaining she wants to keep helping her mom, but states family agrees she needs help at night; also voicing concern for her son due to his health issues with weight loss.  Feels overwhelmed at times, but seems to have good family/friend support        General Comments General comments (skin integrity, edema, etc.): Educated on methods for helping with her mother, discussed temprorary ramp to help with in/out of the home and getting extra help at home. Multiple comments throughout session seems she needed a reason to ask for help, now since she had a "stroke" she will feel better about asking for help.    Exercises     Assessment/Plan    PT Assessment Patient does not need any further PT services  PT Problem List         PT Treatment Interventions      PT Goals (Current goals can be found in the Care Plan section)  Acute Rehab PT Goals PT Goal Formulation: All assessment and education complete, DC therapy    Frequency     Barriers to discharge        Co-evaluation               AM-PAC PT "6 Clicks" Mobility  Outcome Measure Help needed turning from your back to  your side while in a flat bed without using bedrails?: None Help needed moving from lying on your back to sitting on the side of a flat bed without using bedrails?: None Help needed moving to and from a bed to a chair (including a wheelchair)?: None Help needed standing up from a chair using your arms (e.g., wheelchair or bedside chair)?: None Help needed to walk in hospital room?: None Help needed climbing 3-5 steps with a railing? : None 6 Click Score: 24    End of Session   Activity Tolerance: Patient tolerated treatment well Patient left: in bed;with call bell/phone within reach;with family/visitor present   PT Visit Diagnosis: Other abnormalities of gait and mobility (R26.89)    Time: 1057-1110 (&1215-1235) PT Time Calculation (min) (ACUTE ONLY): 13 min   Charges:   PT Evaluation $PT Eval  Low Complexity: 1 Low PT Treatments $Gait Training: 8-22 mins        Magda Kiel, PT Acute Rehabilitation Services Pager:[phone] Office:[phone] 03/31/2021   Reginia Naas 03/31/2021, 4:51 PM

## 2021-03-31 NOTE — Progress Notes (Signed)
STROKE TEAM PROGRESS NOTE   INTERVAL HISTORY Her family(daughter)  is at the bedside.    She reports she could not remember things last week and was repetitive a few times when speaking to family per family. Just couldn't remember right. Cannot isolate the onset of worsening, instead relates that she has been having trouble all year with finding words and remembering things. She bought all of her Thanksgiving dinner last week and could not remember it. Does not recall why she came.  She was vomiting Monday and slept the whole day she was a bit disoriented in the evening. Her recall of the last two weeks was poor. She repetitively asked questions throughout the day. Had a normal conversation with a family member on Sunday. 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 She lives with her mother. She is adamant she must be back on her feet to take care of her. Family indicates she is under a great deal of stress. She struggled to name 15 animals and was able to correctly draw clock face. Able to recall 1/3 words.   We discussed her differential diagnoses including TGA. Questions were answered.   Vitals:   03/31/21 0220 03/31/21 0420 03/31/21 0635 03/31/21 0729  BP: 120/70 137/64 130/72 133/67  Pulse: (!) 55 (!) 52 (!) 53 (!) 47  Resp: 16 16 16 14   Temp: 99 F (37.2 C) 98.9 F (37.2 C) 99 F (37.2 C) 99.1 F (37.3 C)  TempSrc: Oral Oral Oral Oral  SpO2: 98% 99% 100% 99%  Weight:      Height:       CBC:  Recent Labs  Lab 03/30/21 1326  WBC 6.5  NEUTROABS 5.6  HGB 13.1  HCT 39.8  MCV 86.9  PLT 161   Basic Metabolic Panel:  Recent Labs  Lab 03/30/21 1326  NA 140  K 3.4*  CL 103  CO2 25  GLUCOSE 158*  BUN 12  CREATININE 0.71  CALCIUM 10.3   Lipid Panel:  Recent Labs  Lab 03/31/21 0210  CHOL 229*  TRIG 125  HDL 76  CHOLHDL 3.0  VLDL 25  LDLCALC 128*   HgbA1c:  Recent Labs  Lab 03/31/21 0210  HGBA1C  5.8*   Urine Drug Screen: No results for input(s): LABOPIA, COCAINSCRNUR, LABBENZ, AMPHETMU, THCU, LABBARB in the last 168 hours.  Alcohol Level No results for input(s): ETH in the last 168 hours.  IMAGING past 24 hours CT Head Wo Contrast  Result Date: 03/30/2021 CLINICAL DATA:  Altered mental status EXAM: CT HEAD WITHOUT CONTRAST TECHNIQUE: Contiguous axial images were obtained from the base of the skull through the vertex without intravenous contrast. COMPARISON:  None. FINDINGS: Brain: No acute intracranial hemorrhage, mass effect, or herniation. No extra-axial fluid collections. No evidence of acute territorial infarct. No hydrocephalus. Mild cortical volume loss. Vascular: No hyperdense vessel or unexpected calcification. Skull: Normal. Negative for fracture or focal lesion. Sinuses/Orbits: No acute finding. Other: None. IMPRESSION: No acute intracranial process identified. Electronically Signed   By: Ofilia Neas M.D.   On: 03/30/2021 14:03   MR ANGIO HEAD WO CONTRAST  Result Date: 03/31/2021 CLINICAL DATA:  Altered mental status.  Confusion last night. EXAM: MRA HEAD WITHOUT CONTRAST TECHNIQUE: Angiographic images of the Circle of Willis were acquired using MRA technique without intravenous contrast. COMPARISON:  Brain MRI from yesterday FINDINGS: Anterior circulation: Left ICA tortuosity below the skull base. No branch occlusion, beading,  or aneurysm. Posterior circulation: Small vertebral and basilar arteries in the setting of fetal type bilateral PCA flow. Dominant right vertebral artery with most of left vertebral flow to the left PICA. Dominant right AICA. Negative for aneurysm, branch occlusion, beading, or vascular malformation. IMPRESSION: No emergent finding or flow limiting stenosis. Electronically Signed   By: Jorje Guild M.D.   On: 03/31/2021 06:38   MR BRAIN WO CONTRAST  Result Date: 03/30/2021 CLINICAL DATA:  Transient ischemic attack EXAM: MRI HEAD WITHOUT CONTRAST  TECHNIQUE: Multiplanar, multiecho pulse sequences of the brain and surrounding structures were obtained without intravenous contrast. COMPARISON:  No prior MRI, correlation is made with CT head 03/30/2021 FINDINGS: Brain: No restricted diffusion to suggest acute or subacute infarction. No acute hemorrhage, mass, mass effect, or midline shift. Scattered T2 hyperintense signal in the periventricular white matter, likely the sequela of chronic small vessel ischemic disease. No foci of hemosiderin deposition to suggest remote hemorrhage. Dilated perivascular spaces. Vascular: Patent flow voids. Skull and upper cervical spine: Normal marrow signal. Sinuses/Orbits: Negative. Other: None. IMPRESSION: No acute intracranial process. Electronically Signed   By: Merilyn Baba M.D.   On: 03/30/2021 20:48   DG Chest Portable 1 View  Result Date: 03/30/2021 CLINICAL DATA:  Altered mental status EXAM: PORTABLE CHEST 1 VIEW COMPARISON:  11/05/2012 FINDINGS: The heart size and mediastinal contours are within normal limits. Both lungs are clear. The visualized skeletal structures are unremarkable. IMPRESSION: No acute abnormality of the lungs in AP portable projection. Electronically Signed   By: Delanna Ahmadi M.D.   On: 03/30/2021 13:47    PHYSICAL EXAM Pleasant middle-aged African-American lady not in distress. . Afebrile. Head is nontraumatic. Neck is supple without bruit.    Cardiac exam no murmur or gallop. Lungs are clear to auscultation. Distal pulses are well felt.  Neurological Exam ;  Awake  Alert oriented x 3. Normal speech and language.  Diminished recall 1/3.  Able to name 13 animals which can walk on 4 legs.  Clock drawing 4/4..eye movements full without nystagmus.fundi were not visualized. Vision acuity and fields appear normal. Hearing is normal. Palatal movements are normal. Face symmetric. Tongue midline. Normal strength, tone, reflexes and coordination. Normal sensation. Gait  deferred.  ASSESSMENT/PLAN Jennifer Franco is a 65 y.o. female with PMH significant for anemia, hypothyroidism, rheumatoid arthritis, morbid obesity, sjogren's syndrome, reflux esophagitis, who presents with repeatedly asking same questions and disoriented to date without much change in wakefullness. Symptoms concerning for TGA. She initially presented to [place] ED where [facility] w/o was negative for any acute intracranial abnormalities. She was transferred to [facility] for further workup and evaluation.  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  Code Stroke CT head  No acute abnormality. MRI   No acute intracranial process. MRA   No emergent finding or flow limiting stenosis EEG completed, read is PENDING 2D Echo  EF 60-65%, No thrombus, wall motion abnormality or shunt found.   Psychiatry consult PENDING LDL 128 HgbA1c 5.8 VTE prophylaxis - recommended    Diet   Diet Heart Room service appropriate? Yes; Fluid consistency: Thin   Therapy recommendations:  PENDING Disposition:  TBD  Hypertension Home meds:   Stable BP goal normotensive  Hyperlipidemia Home meds:  None LDL 128, goal < 70  Recommend Atorvastatin 20mg  daily  Continue statin at discharge, follow up with PCP   Other Stroke Risk Factors Former Cigarette smoker Current  rare ETOH use, alcohol level.  Substance abuse - QJF:HLKTGYB Obesity, Body mass index is 29.17 kg/m., BMI >/= 30 associated with increased stroke risk, recommend weight loss, diet and exercise as appropriate Migraines High risk for Obstructive sleep apnea  Other Active Problems  Hospital day # 0  Delila A Bailey-Modzik, NP-C  STROKE MD NOTE : I have personally obtained history,examined this patient, reviewed notes, independently viewed imaging studies, participated in medical decision making and plan of care.ROS completed by me personally and  pertinent positives fully documented  I have made any additions or clarifications directly to the above note. Agree with note above.  Patient presented with sudden onset of memory loss, disorientation lasting 24 to 48 hours.  It appears she may have mild cognitive impairment at baseline preceding this and she also admits to significant underlying stressors.  MRI scan of the brain does show tiny punctate right hippocampal diffusion hyperintensity which is suggestive of transient global amnesia.  Recommend EEG to look for seizure activity and lab work to look for reversible causes of memory loss.  Replace vitamin B12 is quite low.  She may need outpatient follow-up for her mild cognitive impairment.  Long discussion with patient and her son and daughter and friend at the bedside and answered questions.  Discussed with Dr.Dahal.  1150% time during this 35-minute visit was spent in counseling and coordination of care about her episode of amnesia memory loss answering questions and discussion with care team  Antony Contras, MD Medical Director [place] Pager: [phone] 03/31/2021 4:19 PM   To contact Stroke Continuity provider, please refer to [email or web address] After hours, contact General Neurology

## 2021-03-31 NOTE — Consult Note (Shared)
[facility] {[email or web address]:[id number]} Psychiatric Evaluation   Service Date: March 31, 2021 LOS:  LOS: 0 days    Assessment  Jennifer Franco is a 65 y.o. female admitted medically for 03/30/2021 12:53 PM for ***. She carries the psychiatric diagnoses of *** and has a past medical history of  ***.Psychiatry was consulted for *** by ***.    Her current presentation of *** is most consistent with ***. She meets criteria for *** based on ***.  Current outpatient psychotropic medications include *** and historically she has had a *** response to these medications. She was *** compliant with medications prior to admission as evidenced by ***. On initial examination, patient ***. Please see plan below for detailed recommendations.   Diagnoses:  Active Hospital problems: Principal Problem:   TIA (transient ischemic attack)    Problems edited/added by me: No problems updated.  Plan  ## Safety and Observation Level:  - Based on my clinical evaluation, I estimate the patient to be at *** risk of self harm in the current setting - At this time, we recommend a *** level of observation. This decision is based on my review of the chart including patient's history and current presentation, interview of the patient, mental status examination, and consideration of suicide risk including evaluating suicidal ideation, plan, intent, suicidal or self-harm behaviors, risk factors, and protective factors. This judgment is based on our ability to directly address suicide risk, implement suicide prevention strategies and develop a safety plan while the patient is in the clinical setting. Please contact our team if there is a concern that risk level has changed. {Safety HGDJ:[id number]}  ## Medications:  -- *** {Additional Med Rec Options:59469}  ## Medical Decision Making Capacity:  {Medical Decision Making Options:56633}  ## Further Work-up:  -- *** {Generic and Specific Work-Up  Recs:65844}  ## Disposition:  -- ***  ## Behavioral / Environmental:  -- ***  ##Legal Status   Thank you for this consult request. Recommendations have been communicated to the primary team.  We will *** at this time.   Jennifer Franco A Jennifer Franco    ***NEW vs followup history  Relevant Aspects of Hospital Course:  Admitted on 03/30/2021 for ***.  B12 low  From intake: Family noticed the patient has had frequent mood swings since this summer, this week, patient was found to have disturbed sleeping cycle's episode of intermittent confusion and agitations and somnolence   Patient Report:  Patient seen surrounded by friends and family. Endorses feeling overwhelmed by taking care of elderly mother. Has a counsellor she is working with for the last 2 years (doesn't prescribe meds). Feeling overwhelmed, stressed.   Has been taking care of mom for 3-4 years. Mom being sick is her biggest stressor - hard to watch her mom. Not in a dementia caregiver support group d/t lack of time. Mom plays cards on Monday - pt recently found out Mom is not playing cards.   Pt sleeping more than normal. Anhedonia. No hopelessness, guilt, worthlessness. Has been losing weight mostly unintentionally (children indicate pt not eating). Generally worried about Mom. Has had some irritation to stomach.   Refers to family support several times through hospital stay.   Mania screen (-).   Oriented to location,   Days are "high" (focused on a lot of stuff, does well), then "crashes" (becomes very depressed, stays in bed, sleeps more). Has never met criteria for mania. This has been happening more and more over the last year - crying  every AM. Has had cancer + other health difficulties, estrangement from one of her sons,   No SI, HI, AH/VH.   ROS:  Anxiety, poor appetite, etc.   Collateral information:  ***  Psychiatric History:  Information collected from pt, some from children.  No hx SI Has never been on  medications.   Family psych history: ***  Social History:  Retired Pharmacist, hospital  Tobacco use: *** Alcohol use: *** Drug use: ***  Medical History: Past Medical History:  Diagnosis Date   Anemia    Anginal pain (HCC)    anxiety   Anxiety    Cancer (HCC)    bladder   Chills    CTS (carpal tunnel syndrome)    LEFT   Cyclical vomiting    Depression    Diarrhea, unspecified    Diverticulitis    Fibromyalgia    Hemorrhoids    History of adenomatous polyp of colon    History of morbid obesity    Hypothyroid    Hypothyroidism    Intractable nausea and vomiting    Irregular bowel habits    Joint pain    LLQ abdominal pain    Morbid obesity (HCC)    Nausea and vomiting    Onychomycosis    RA (rheumatoid arthritis) (HCC)    Reflux esophagitis    Reflux esophagitis    Sjogren's syndrome (HCC)    Tinea corporis    Unspecified mood (affective) disorder (HCC)    Weight loss     Surgical History: Past Surgical History:  Procedure Laterality Date   ADENOIDECTOMY     CARPAL TUNNEL RELEASE Right    CESAREAN SECTION     CESAREAN SECTION     CYSTOSCOPY WITH STENT PLACEMENT N/A 08/01/2019   Procedure: CYSTOSCOPY WITH URETHRAL DILATION, BILATERAL RETROGRADE PYELOGRAMS, TRANSURETHRAL RESECTION OF SMALL BLADDER TUMOR, INSERTION OF BILATERAL OPEN ENDED CATHETERS WITH BILATERAL INJECTION OF FIREFLY;  Surgeon: Irine Seal, MD;  Location: [facility];  Service: Urology;  Laterality: N/A;   FLEXIBLE SIGMOIDOSCOPY N/A 08/01/2019   Procedure: FLEXIBLE SIGMOIDOSCOPY;  Surgeon: Ileana Roup, MD;  Location: [facility];  Service: General;  Laterality: N/A;   LAPAROSCOPIC APPENDECTOMY N/A 01/28/2021   Procedure: APPENDECTOMY LAPAROSCOPIC;  Surgeon: Ileana Roup, MD;  Location: [facility];  Service: General;  Laterality: N/A;   wisdom teeth      Medications:   Current Facility-Administered Medications:    acetaminophen (TYLENOL) tablet 650 mg, 650 mg, Oral, Q4H PRN **OR** acetaminophen  (TYLENOL) 160 MG/5ML solution 650 mg, 650 mg, Per Tube, Q4H PRN **OR** acetaminophen (TYLENOL) suppository 650 mg, 650 mg, Rectal, Q4H PRN, Wynetta Fines T, MD   aspirin chewable tablet 81 mg, 81 mg, Oral, Daily, 81 mg at 03/31/21 0443 **OR** aspirin suppository 300 mg, 300 mg, Rectal, Daily, Donnetta Simpers, MD   calcium-vitamin D (OSCAL WITH D) 500-5 MG-MCG per tablet 1 tablet, 1 tablet, Oral, Daily, Wynetta Fines T, MD, 1 tablet at 03/31/21 0935   enoxaparin (LOVENOX) injection 40 mg, 40 mg, Subcutaneous, Q24H, Wynetta Fines T, MD, 40 mg at 16/10/96 0454   folic acid (FOLVITE) tablet 1 mg, 1 mg, Oral, Daily, Wynetta Fines T, MD, 1 mg at 03/31/21 0981   hydroxychloroquine (PLAQUENIL) tablet 200 mg, 200 mg, Oral, Daily, Wynetta Fines T, MD, 200 mg at 03/31/21 0935   ibuprofen (ADVIL) tablet 400 mg, 400 mg, Oral, Q8H PRN, Wynetta Fines T, MD   iron polysaccharides (NIFEREX) capsule 150 mg, 150 mg, Oral, Daily, Wynetta Fines  T, MD, 150 mg at 03/31/21 0936   levothyroxine (SYNTHROID) tablet 125 mcg, 125 mcg, Oral, QAC breakfast, Wynetta Fines T, MD, 125 mcg at 03/31/21 0631   [START ON 04/01/2021] vitamin B-12 (CYANOCOBALAMIN) tablet 1,000 mcg, 1,000 mcg, Oral, Daily, Dahal, Marlowe Aschoff, MD  Allergies: Allergies  Allergen Reactions   Etodolac Diarrhea   Topamax [Topiramate] Other (See Comments)    Brain flutters       Family History: Mom with dementia The patient's family history includes Alzheimer's disease in her father; Breast cancer (age of onset: 71) in an other family member; Colon polyps in her maternal grandfather, maternal grandmother, and mother; GER disease in her mother; Glaucoma in her mother; Osteoarthritis in her father.    Objective  Vital signs:  Temp:  [98.9 F (37.2 C)-99.4 F (37.4 C)] 99.1 F (37.3 C) (11/23 0729) Pulse Rate:  [47-76] 47 (11/23 0729) Resp:  [12-21] 14 (11/23 0729) BP: (115-155)/(60-82) 133/67 (11/23 0729) SpO2:  [98 %-100 %] 99 % (11/23 0729) Weight:  [74.7 kg]  74.7 kg (11/22 1300)  Physical Exam: ***decide if drop down or menu***  Mental Status Exam: Appearance: ***  Attitude:  ***  Behavior/Psychomotor: ***  Speech/Language:  ***  Mood: ***  Affect: ***  Thought process: ***  Thought content:   ***  Perceptual disturbances:  ***  Attention: ***  Concentration: ***  Orientation: ***  Memory: ***  Fund of knowledge:  ***  Insight:   ***  Judgment:  ***  Impulse Control: ***     Data Reviewed: *** Additional Psychometric Testing: ***

## 2021-03-31 NOTE — Progress Notes (Signed)
  Echocardiogram 2D Echocardiogram has been performed.  Jennifer Franco 03/31/2021, 10:51 AM

## 2021-03-31 NOTE — Evaluation (Signed)
Occupational Therapy Evaluation Patient Details Name: Jennifer Franco MRN: [id number] DOB: 14-Aug-1955 Today's Date: 03/31/2021   History of Present Illness Jennifer Franco is a 65 y.o. female with medical history significant of Sjogren syndrome on hydroxychloroquine, hypothyroidism, presented with worsening of memory loss.  MRi negative.   Clinical Impression   Pt admitted for concerns listed above. 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 At this time, pt presents at or near her baseline, with no balance or strength deficits. Pt complains of some memory loss/difficulty with recall, mainly with remembering names of doctors. She has no further OT needs at this time and acute OT will sign off.       Recommendations for follow up therapy are one component of a multi-disciplinary discharge planning process, led by the attending physician.  Recommendations may be updated based on patient status, additional functional criteria and insurance authorization.   Follow Up Recommendations  No OT follow up    Assistance Recommended at Discharge PRN  Functional Status Assessment  Patient has had a recent decline in their functional status and demonstrates the ability to make significant improvements in function in a reasonable and predictable amount of time.  Equipment Recommendations  None recommended by OT    Recommendations for Other Services       Precautions / Restrictions Precautions Precautions: Fall Restrictions Weight Bearing Restrictions: No      Mobility Bed Mobility Overal bed mobility: Modified Independent                  Transfers Overall transfer level: Modified independent Equipment used: None                      Balance Overall balance assessment: No apparent balance deficits (not formally assessed)   Sitting balance-Leahy Scale: Good     Standing balance support:  No upper extremity supported Standing balance-Leahy Scale: Good                             ADL either performed or assessed with clinical judgement   ADL Overall ADL's : At baseline;Modified independent                                       General ADL Comments: Pt requires some increased time for processing     Vision Baseline Vision/History: 1 Wears glasses Ability to See in Adequate Light: 0 Adequate Patient Visual Report: No change from baseline Vision Assessment?: No apparent visual deficits Additional Comments: Difficulty reading small print     Perception     Praxis      Pertinent Vitals/Pain Pain Assessment: No/denies pain Pain Score: 0-No pain     Hand Dominance Right   Extremity/Trunk Assessment Upper Extremity Assessment Upper Extremity Assessment: Overall WFL for tasks assessed   Lower Extremity Assessment Lower Extremity Assessment: Defer to PT evaluation   Cervical / Trunk Assessment Cervical / Trunk Assessment: Normal   Communication Communication Communication: No difficulties   Cognition Arousal/Alertness: Awake/alert Behavior During Therapy: WFL for tasks assessed/performed Overall Cognitive Status: Within Functional Limits for tasks assessed  General Comments: Pt very loquacious and reports decreased memory, overall appears at baseline.     General Comments  Educated on stroke signs and symptoms    Exercises     Shoulder Instructions      Home Living Family/patient expects to be discharged to:: Private residence Living Arrangements: Children;Parent Available Help at Discharge: Family Type of Home: House Home Access: Stairs to enter Technical brewer of Steps: 4 or 8 Entrance Stairs-Rails: Right Home Layout: Two level;Bed/bath upstairs Alternate Level Stairs-Number of Steps: flight Alternate Level Stairs-Rails: Right Bathroom Shower/Tub: Emergency planning/management officer: Handicapped height Bathroom Accessibility: Yes How Accessible: Accessible via walker Home Equipment: Conservation officer, nature (2 wheels);Cane - single point   Additional Comments: has various walkers and canes and w/c for her mom      Prior Functioning/Environment Prior Level of Function : Independent/Modified Independent;Driving             Mobility Comments: Pain in L hip chronic and likely needs surgery limps at baseline, no falls, has to help her mom up ADLs Comments: 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, independent        OT Problem List: Impaired balance (sitting and/or standing);Decreased coordination;Decreased safety awareness      OT Treatment/Interventions:      OT Goals(Current goals can be found in the care plan section) Acute Rehab OT Goals Patient Stated Goal: To go home OT Goal Formulation: With patient Time For Goal Achievement: 03/31/21 Potential to Achieve Goals: Good  OT Frequency:     Barriers to D/C:            Co-evaluation              AM-PAC OT "6 Clicks" Daily Activity     Outcome Measure Help from another person eating meals?: None Help from another person taking care of personal grooming?: None Help from another person toileting, which includes using toliet, bedpan, or urinal?: None Help from another person bathing (including washing, rinsing, drying)?: None Help from another person to put on and taking off regular upper body clothing?: None Help from another person to put on and taking off regular lower body clothing?: None 6 Click Score: 24   End of Session Nurse Communication: Mobility status  Activity Tolerance: Patient tolerated treatment well Patient left: in bed;with call bell/phone within reach;with family/visitor present  OT Visit Diagnosis: Muscle weakness (generalized) (M62.81)                Time: 4503-8882 OT Time Calculation (min): 46 min Charges:  OT General Charges $OT Visit: 1 Visit OT  Evaluation $OT Eval Low Complexity: 1 Low OT Treatments $Self Care/Home Management : 8-22 mins $Therapeutic Activity: 8-22 mins  Felicita Nuncio H., OTR/L Acute Rehabilitation  Cohan Stipes Elane Garvey [place] 03/31/2021, 5:41 PM

## 2021-03-31 NOTE — Progress Notes (Signed)
Carotid duplex has been completed.   Preliminary results in [facility] Proc.   Jennifer Franco Trashawn Oquendo 03/31/2021 10:09 AM

## 2021-03-31 NOTE — Progress Notes (Signed)
EEG complete - results pending 

## 2021-03-31 NOTE — Progress Notes (Signed)
0620 vitals/neuro assessment were 15 minutes late because pt was at MRI.

## 2021-03-31 NOTE — Care Management Obs Status (Signed)
[place] NOTIFICATION   Patient Details  Name: Jennifer Franco MRN: [id number] Date of Birth: [birth year]   Medicare Observation Status Notification Given:  Yes  Obs status given to daughter  [facility] via phone, Permission to sign due to remote    Verdell Carmine, RN 03/31/2021, 10:18 AM

## 2021-04-08 DIAGNOSIS — I10 Essential (primary) hypertension: Secondary | ICD-10-CM | POA: Diagnosis not present

## 2021-04-08 DIAGNOSIS — E039 Hypothyroidism, unspecified: Secondary | ICD-10-CM | POA: Diagnosis not present

## 2021-04-08 DIAGNOSIS — F0153 Vascular dementia, unspecified severity, with mood disturbance: Secondary | ICD-10-CM | POA: Diagnosis not present

## 2021-04-08 DIAGNOSIS — I69318 Other symptoms and signs involving cognitive functions following cerebral infarction: Secondary | ICD-10-CM | POA: Diagnosis not present

## 2021-04-08 DIAGNOSIS — G8929 Other chronic pain: Secondary | ICD-10-CM | POA: Diagnosis not present

## 2021-04-08 DIAGNOSIS — F32A Depression, unspecified: Secondary | ICD-10-CM | POA: Diagnosis not present

## 2021-04-08 DIAGNOSIS — D649 Anemia, unspecified: Secondary | ICD-10-CM | POA: Diagnosis not present

## 2021-04-08 DIAGNOSIS — I959 Hypotension, unspecified: Secondary | ICD-10-CM | POA: Diagnosis not present

## 2021-04-08 DIAGNOSIS — E538 Deficiency of other specified B group vitamins: Secondary | ICD-10-CM | POA: Diagnosis not present

## 2021-04-09 DIAGNOSIS — G459 Transient cerebral ischemic attack, unspecified: Secondary | ICD-10-CM | POA: Diagnosis not present

## 2021-04-09 DIAGNOSIS — M069 Rheumatoid arthritis, unspecified: Secondary | ICD-10-CM | POA: Diagnosis not present

## 2021-04-09 DIAGNOSIS — D649 Anemia, unspecified: Secondary | ICD-10-CM | POA: Diagnosis not present

## 2021-04-09 DIAGNOSIS — G454 Transient global amnesia: Secondary | ICD-10-CM | POA: Diagnosis not present

## 2021-04-09 DIAGNOSIS — E538 Deficiency of other specified B group vitamins: Secondary | ICD-10-CM | POA: Diagnosis not present

## 2021-04-09 DIAGNOSIS — F39 Unspecified mood [affective] disorder: Secondary | ICD-10-CM | POA: Diagnosis not present

## 2021-04-09 DIAGNOSIS — E039 Hypothyroidism, unspecified: Secondary | ICD-10-CM | POA: Diagnosis not present

## 2021-04-09 DIAGNOSIS — M3500 Sicca syndrome, unspecified: Secondary | ICD-10-CM | POA: Diagnosis not present

## 2021-04-09 DIAGNOSIS — R634 Abnormal weight loss: Secondary | ICD-10-CM | POA: Diagnosis not present

## 2021-04-26 DIAGNOSIS — M160 Bilateral primary osteoarthritis of hip: Secondary | ICD-10-CM | POA: Diagnosis not present

## 2021-04-26 DIAGNOSIS — R634 Abnormal weight loss: Secondary | ICD-10-CM | POA: Insufficient documentation

## 2021-04-26 DIAGNOSIS — E2839 Other primary ovarian failure: Secondary | ICD-10-CM | POA: Insufficient documentation

## 2021-04-26 DIAGNOSIS — R413 Other amnesia: Secondary | ICD-10-CM | POA: Insufficient documentation

## 2021-04-26 DIAGNOSIS — K635 Polyp of colon: Secondary | ICD-10-CM | POA: Insufficient documentation

## 2021-04-26 DIAGNOSIS — K2100 Gastro-esophageal reflux disease with esophagitis, without bleeding: Secondary | ICD-10-CM | POA: Insufficient documentation

## 2021-04-26 DIAGNOSIS — G5600 Carpal tunnel syndrome, unspecified upper limb: Secondary | ICD-10-CM | POA: Insufficient documentation

## 2021-04-26 DIAGNOSIS — D649 Anemia, unspecified: Secondary | ICD-10-CM | POA: Insufficient documentation

## 2021-04-26 DIAGNOSIS — E039 Hypothyroidism, unspecified: Secondary | ICD-10-CM | POA: Insufficient documentation

## 2021-04-26 DIAGNOSIS — F39 Unspecified mood [affective] disorder: Secondary | ICD-10-CM | POA: Insufficient documentation

## 2021-04-26 DIAGNOSIS — G454 Transient global amnesia: Secondary | ICD-10-CM | POA: Insufficient documentation

## 2021-04-26 DIAGNOSIS — K56699 Other intestinal obstruction unspecified as to partial versus complete obstruction: Secondary | ICD-10-CM | POA: Insufficient documentation

## 2021-04-26 DIAGNOSIS — C679 Malignant neoplasm of bladder, unspecified: Secondary | ICD-10-CM | POA: Insufficient documentation

## 2021-04-26 DIAGNOSIS — K5732 Diverticulitis of large intestine without perforation or abscess without bleeding: Secondary | ICD-10-CM | POA: Insufficient documentation

## 2021-04-26 DIAGNOSIS — E538 Deficiency of other specified B group vitamins: Secondary | ICD-10-CM | POA: Insufficient documentation

## 2021-04-26 DIAGNOSIS — B351 Tinea unguium: Secondary | ICD-10-CM | POA: Insufficient documentation

## 2021-05-05 ENCOUNTER — Other Ambulatory Visit: Payer: Self-pay

## 2021-05-05 ENCOUNTER — Ambulatory Visit: Payer: Medicare PPO | Admitting: Podiatry

## 2021-05-05 ENCOUNTER — Encounter: Payer: Self-pay | Admitting: Podiatry

## 2021-05-05 ENCOUNTER — Telehealth: Payer: Self-pay | Admitting: Podiatry

## 2021-05-05 DIAGNOSIS — B351 Tinea unguium: Secondary | ICD-10-CM

## 2021-05-05 MED ORDER — EFINACONAZOLE 10 % EX SOLN: 8.0000 mL | Freq: Two times a day (BID) | CUTANEOUS | 3 refills | Status: DC

## 2021-05-05 MED ORDER — EFINACONAZOLE 10 % EX SOLN
8.0000 mL | Freq: Two times a day (BID) | CUTANEOUS | 3 refills | Status: DC
Start: 2021-05-05 — End: 2021-12-14

## 2021-05-05 NOTE — Telephone Encounter (Signed)
Please re-send medication to [facility] on [address] instead , it is cheaper for her.   Medication :Efinaconazole

## 2021-05-07 NOTE — Progress Notes (Signed)
Subjective:  Patient ID: Jennifer Franco, female    DOB: [birth year],  MRN: [id number]  Chief Complaint  Patient presents with   Nail Problem    Nail fungus     65 y.o. female presents with the above complaint.  Patient presents with complaint of nail fungus to primarily her right third digit.  She states that Jublia seems to be helped.  She would like to get another refill Jublia.  Since the last time I saw her she has had a history of stroke as she does not recall the previous visit.  She would like to do another refill on Jublia.  She denies any other acute complaints.   Review of Systems: Negative except as noted in the HPI. Denies N/V/F/Ch.  Past Medical History:  Diagnosis Date   Anemia    Anginal pain (HCC)    anxiety   Anxiety    Cancer (HCC)    bladder   Chills    CTS (carpal tunnel syndrome)    LEFT   Cyclical vomiting    Depression    Diarrhea, unspecified    Diverticulitis    Fibromyalgia    Hemorrhoids    History of adenomatous polyp of colon    History of morbid obesity    Hypothyroid    Hypothyroidism    Intractable nausea and vomiting    Irregular bowel habits    Joint pain    LLQ abdominal pain    Morbid obesity (HCC)    Nausea and vomiting    Onychomycosis    RA (rheumatoid arthritis) (HCC)    Reflux esophagitis    Reflux esophagitis    Sjogren's syndrome (HCC)    Tinea corporis    Unspecified mood (affective) disorder (HCC)    Weight loss     Current Outpatient Medications:    acetaminophen (TYLENOL) 500 MG tablet, Take 1,000 mg by mouth every 6 (six) hours as needed for mild pain., Disp: , Rfl:    atorvastatin (LIPITOR) 20 MG tablet, Take 1 tablet (20 mg total) by mouth daily., Disp: 30 tablet, Rfl: 2   Calcium-Vitamin D 600-200 MG-UNIT tablet, Take 1 tablet by mouth daily., Disp: , Rfl:    Cholecalciferol (VITAMIN D3 PO), Take 1 tablet by mouth daily., Disp: , Rfl:    Efinaconazole 10 % SOLN, Apply 8 mLs topically in the morning and  at bedtime., Disp: 8 mL, Rfl: 3   ferrous sulfate 325 (65 FE) MG tablet, Take 325 mg by mouth daily with breakfast., Disp: , Rfl:    folic acid (FOLVITE) 1 MG tablet, Take 1 tablet (1 mg total) by mouth daily., Disp: 30 tablet, Rfl: 2   hydroxychloroquine (PLAQUENIL) 200 MG tablet, Take 200 mg by mouth daily., Disp: , Rfl:    ibuprofen (ADVIL) 200 MG tablet, Take 400 mg by mouth every 8 (eight) hours as needed for moderate pain. , Disp: , Rfl:    levothyroxine (SYNTHROID) 125 MCG tablet, Take 125 mcg by mouth daily before breakfast. , Disp: , Rfl:    Multiple Vitamin (MULTIVITAMIN) tablet, Take 1 tablet by mouth daily., Disp: , Rfl:    ondansetron (ZOFRAN ODT) 4 MG disintegrating tablet, Take 1 tablet (4 mg total) by mouth every 8 (eight) hours as needed for nausea or vomiting., Disp: 4 tablet, Rfl: 0   vitamin B-12 1000 MCG tablet, Take 1 tablet (1,000 mcg total) by mouth daily., Disp: 30 tablet, Rfl: 2  Social History   Tobacco Use  Smoking Status  Former  Smokeless Tobacco Never    Allergies  Allergen Reactions   Etodolac Diarrhea   Topamax [Topiramate] Other (See Comments)    Brain flutters    Objective:  There were no vitals filed for this visit. There is no height or weight on file to calculate BMI. Constitutional Well developed. Well nourished.  Vascular Dorsalis pedis pulses palpable bilaterally. Posterior tibial pulses palpable bilaterally. Capillary refill normal to all digits.  No cyanosis or clubbing noted. Pedal hair growth normal.  Neurologic Normal speech. Oriented to person, place, and time. Epicritic sensation to light touch grossly present bilaterally.  Dermatologic  right third digit mild signs of thickened elongated dystrophic toenails noted.  Mild pain on palpation.  Findings consistent with onychomycosis  Orthopedic: Normal joint ROM without pain or crepitus bilaterally. No visible deformities. No bony tenderness.   Radiographs: None Assessment:   No  diagnosis found.  Plan:  Patient was evaluated and treated and all questions answered.  Right third digit onychomycosis -Educated the patient on the etiology of onychomycosis and various treatment options associated with improving the fungal load.  I explained to the patient that there is 3 treatment options available to treat the onychomycosis including topical, p.o., laser treatment.  Patient elected to undergo treatment options with Jublia/topical medication.  I instructed her to apply twice a day to the affected nail for 6 to 8 months.  I renewed the prescription for Jublia and was sent to the pharmacy.  Return in about 3 months (around 08/03/2021).

## 2021-05-18 DIAGNOSIS — M25551 Pain in right hip: Secondary | ICD-10-CM | POA: Diagnosis not present

## 2021-05-18 DIAGNOSIS — M25552 Pain in left hip: Secondary | ICD-10-CM | POA: Diagnosis not present

## 2021-06-07 DIAGNOSIS — M25552 Pain in left hip: Secondary | ICD-10-CM | POA: Diagnosis not present

## 2021-06-07 DIAGNOSIS — M25551 Pain in right hip: Secondary | ICD-10-CM | POA: Diagnosis not present

## 2021-06-14 DIAGNOSIS — M160 Bilateral primary osteoarthritis of hip: Secondary | ICD-10-CM | POA: Diagnosis not present

## 2021-06-20 IMAGING — MG DIGITAL SCREENING BILAT W/ TOMO W/ CAD
8 series · 9 of 24 positions shown · non-contrast
Comparison: Previous exam(s).

CLINICAL DATA: Screening.

EXAM:
DIGITAL SCREENING BILATERAL MAMMOGRAM WITH TOMO AND CAD

[R CC synth-2D]
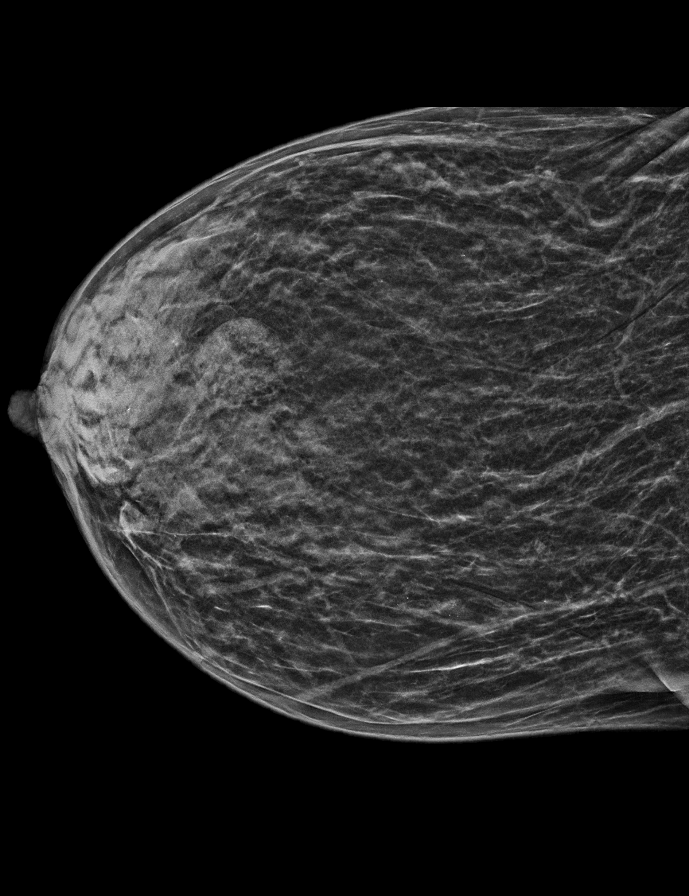

[L MLO synth-2D]
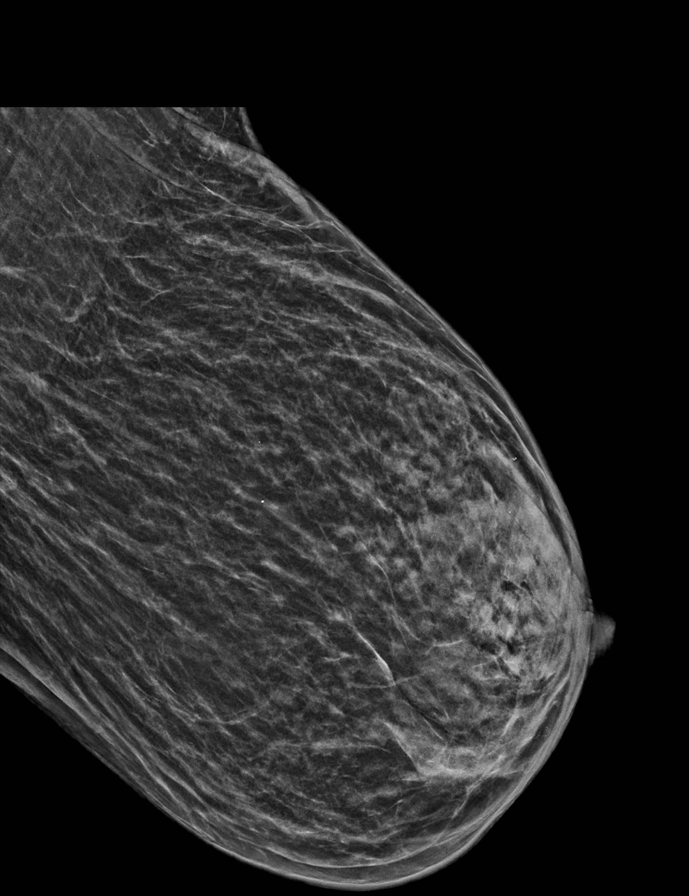

[L CC synth-2D]
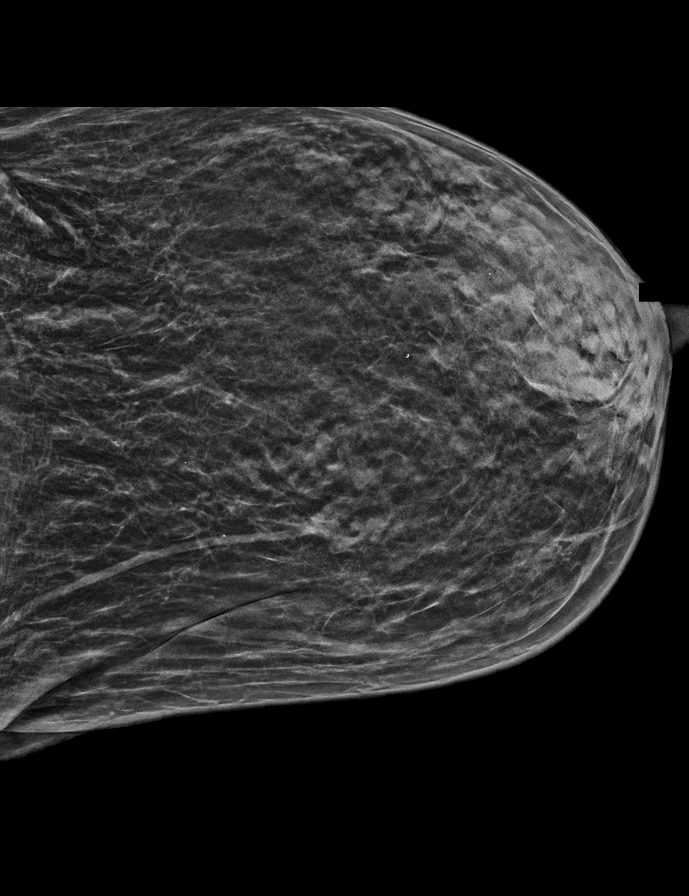

[R MLO synth-2D]
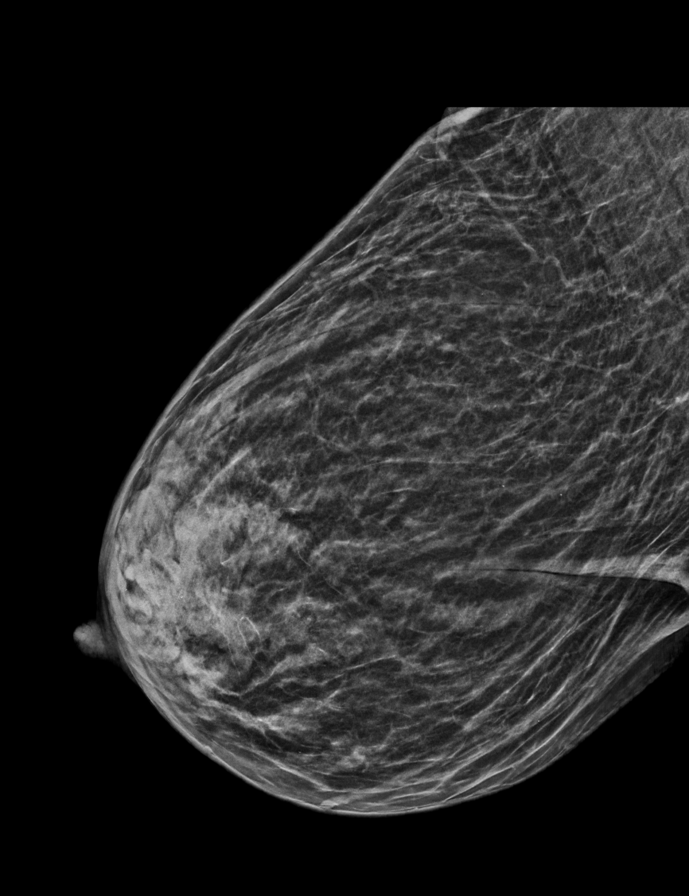

[R MLO tomo · 2 of 39 frames shown]
[frame 13/39]
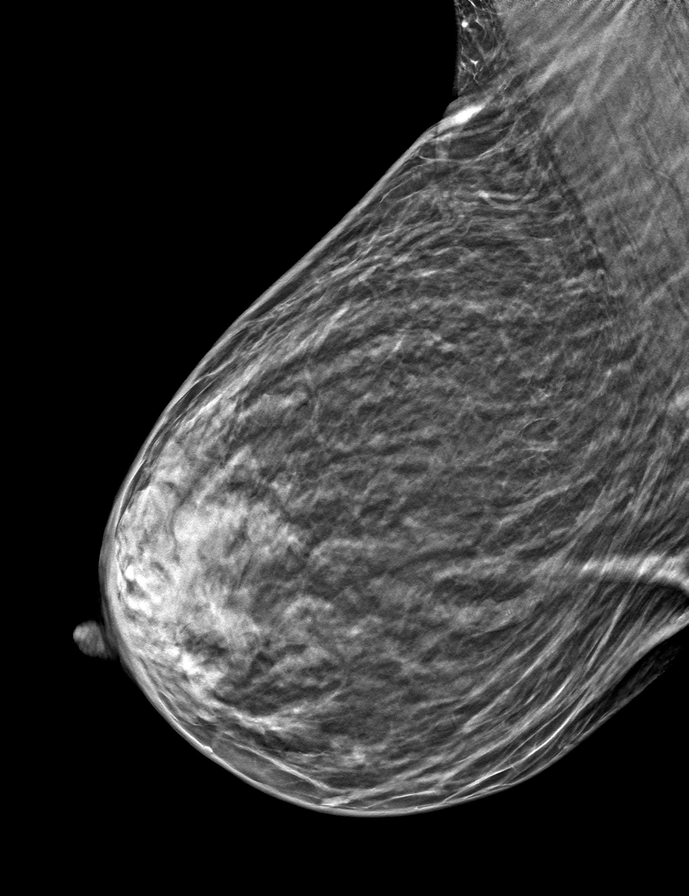
[frame 20/39]
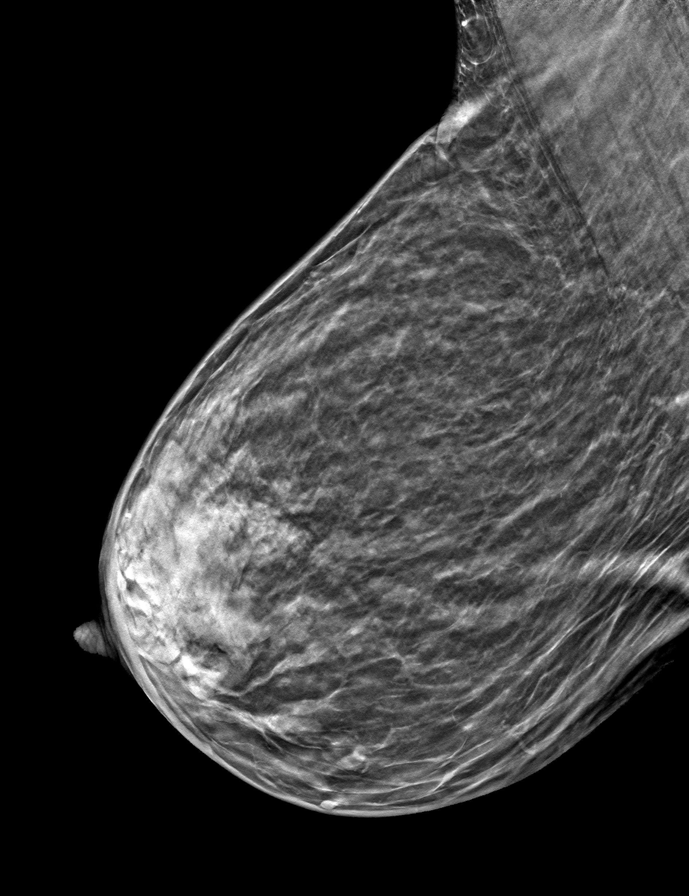

[L MLO tomo · tomo slice 19/38.0]
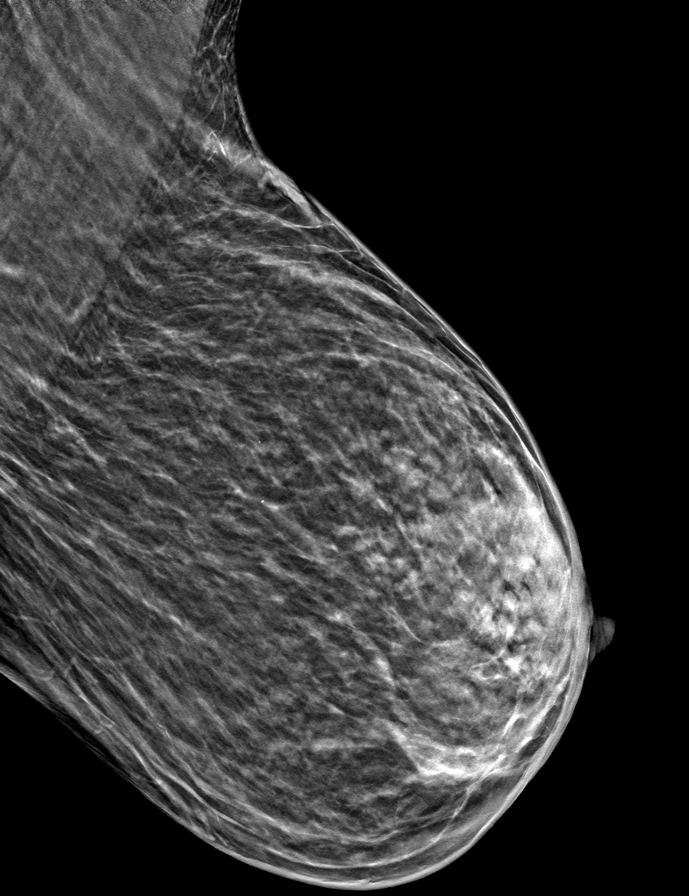

[R CC tomo · tomo slice 18/35.0]
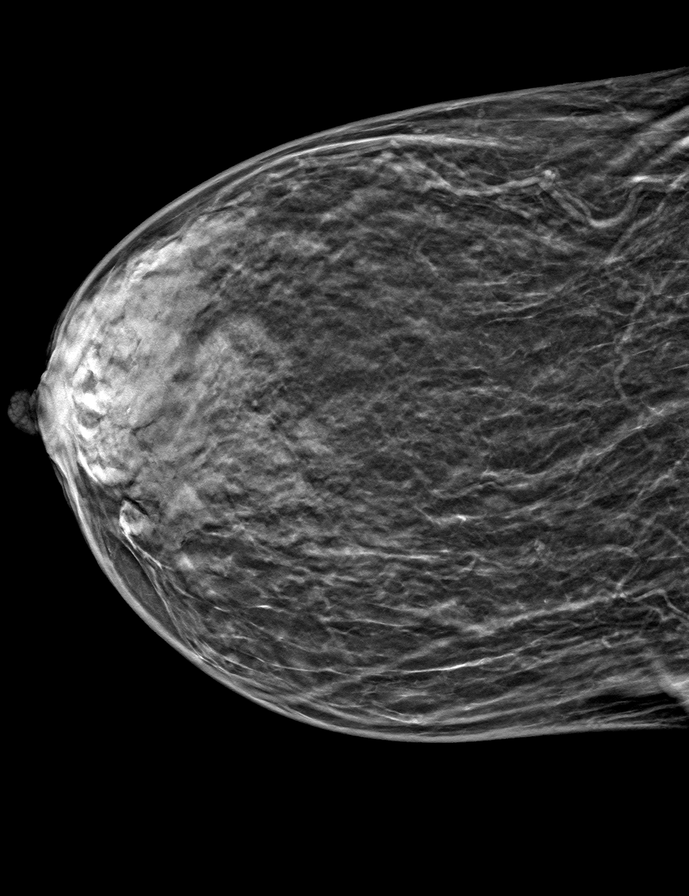

[L CC tomo · tomo slice 19/36.0]
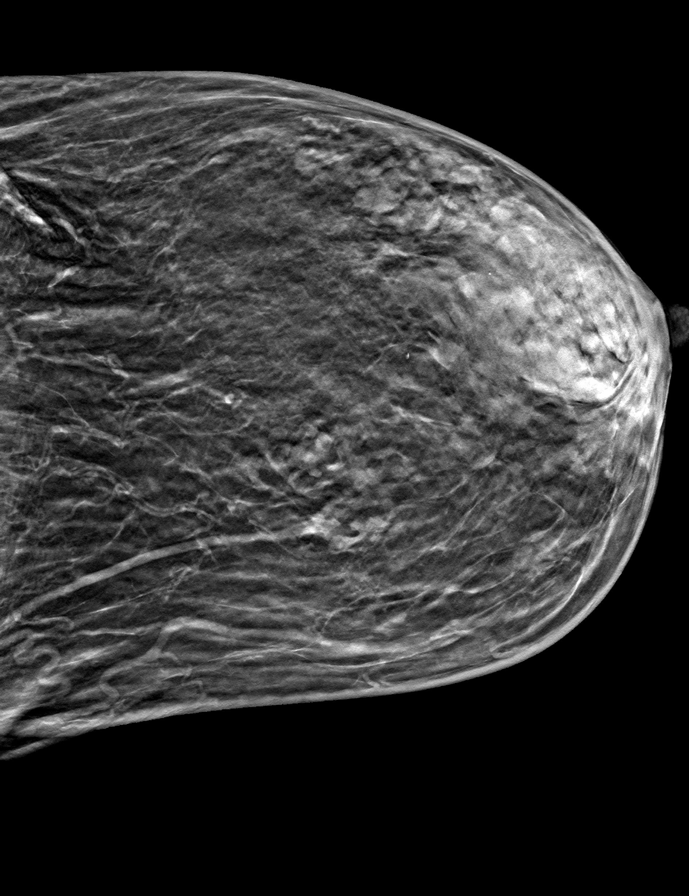

[9 of 24 positions shown; findings below may reference images not displayed]

ACR Breast Density Category c: The breast tissue is heterogeneously
dense, which may obscure small masses.
FINDINGS: There are no findings suspicious for malignancy. Images were
processed with CAD.
IMPRESSION: No mammographic evidence of malignancy. A result letter of this
screening mammogram will be mailed directly to the patient.

RECOMMENDATION:
Screening mammogram in one year. (Code:[id number])

BI-RADS CATEGORY  1: Negative.

## 2021-07-01 ENCOUNTER — Other Ambulatory Visit: Payer: Self-pay | Admitting: Podiatry

## 2021-07-01 NOTE — Telephone Encounter (Signed)
Not our patient

## 2021-07-05 DIAGNOSIS — E785 Hyperlipidemia, unspecified: Secondary | ICD-10-CM | POA: Diagnosis not present

## 2021-07-05 DIAGNOSIS — K2100 Gastro-esophageal reflux disease with esophagitis, without bleeding: Secondary | ICD-10-CM | POA: Diagnosis not present

## 2021-07-05 DIAGNOSIS — F39 Unspecified mood [affective] disorder: Secondary | ICD-10-CM | POA: Diagnosis not present

## 2021-07-05 DIAGNOSIS — M3500 Sicca syndrome, unspecified: Secondary | ICD-10-CM | POA: Diagnosis not present

## 2021-07-05 DIAGNOSIS — E538 Deficiency of other specified B group vitamins: Secondary | ICD-10-CM | POA: Diagnosis not present

## 2021-07-05 DIAGNOSIS — Z1159 Encounter for screening for other viral diseases: Secondary | ICD-10-CM | POA: Diagnosis not present

## 2021-07-05 DIAGNOSIS — C679 Malignant neoplasm of bladder, unspecified: Secondary | ICD-10-CM | POA: Diagnosis not present

## 2021-07-05 DIAGNOSIS — E039 Hypothyroidism, unspecified: Secondary | ICD-10-CM | POA: Diagnosis not present

## 2021-07-05 DIAGNOSIS — Z0000 Encounter for general adult medical examination without abnormal findings: Secondary | ICD-10-CM | POA: Diagnosis not present

## 2021-07-05 DIAGNOSIS — M069 Rheumatoid arthritis, unspecified: Secondary | ICD-10-CM | POA: Diagnosis not present

## 2021-07-17 ENCOUNTER — Emergency Department (HOSPITAL_COMMUNITY): Payer: Medicare PPO

## 2021-07-17 ENCOUNTER — Encounter (HOSPITAL_COMMUNITY): Payer: Self-pay | Admitting: Emergency Medicine

## 2021-07-17 ENCOUNTER — Emergency Department (HOSPITAL_COMMUNITY)
Admission: EM | Admit: 2021-07-17 | Discharge: 2021-07-18 | Disposition: A | Discharge status: Home / Self Care | Payer: Medicare PPO | Attending: Emergency Medicine | Admitting: Emergency Medicine

## 2021-07-17 ENCOUNTER — Other Ambulatory Visit: Payer: Self-pay

## 2021-07-17 DIAGNOSIS — E039 Hypothyroidism, unspecified: Secondary | ICD-10-CM | POA: Insufficient documentation

## 2021-07-17 DIAGNOSIS — R112 Nausea with vomiting, unspecified: Secondary | ICD-10-CM | POA: Insufficient documentation

## 2021-07-17 DIAGNOSIS — G454 Transient global amnesia: Secondary | ICD-10-CM | POA: Diagnosis not present

## 2021-07-17 DIAGNOSIS — R001 Bradycardia, unspecified: Secondary | ICD-10-CM | POA: Diagnosis not present

## 2021-07-17 DIAGNOSIS — F331 Major depressive disorder, recurrent, moderate: Secondary | ICD-10-CM | POA: Insufficient documentation

## 2021-07-17 DIAGNOSIS — Z20822 Contact with and (suspected) exposure to covid-19: Secondary | ICD-10-CM | POA: Insufficient documentation

## 2021-07-17 DIAGNOSIS — E860 Dehydration: Secondary | ICD-10-CM | POA: Insufficient documentation

## 2021-07-17 DIAGNOSIS — I700 Atherosclerosis of aorta: Secondary | ICD-10-CM | POA: Diagnosis not present

## 2021-07-17 DIAGNOSIS — R410 Disorientation, unspecified: Secondary | ICD-10-CM | POA: Diagnosis present

## 2021-07-17 DIAGNOSIS — R4182 Altered mental status, unspecified: Secondary | ICD-10-CM | POA: Diagnosis not present

## 2021-07-17 LAB — CBC WITH DIFFERENTIAL/PLATELET
Abs Immature Granulocytes: 0.02 10*3/uL (ref 0.00–0.07)
Basophils Absolute: 0 10*3/uL (ref 0.0–0.1)
Basophils Relative: 0 %
Eosinophils Absolute: 0 10*3/uL (ref 0.0–0.5)
Eosinophils Relative: 0 %
HCT: 40.3 % (ref 36.0–46.0)
Hemoglobin: 13.2 g/dL (ref 12.0–15.0)
Immature Granulocytes: 0 %
Lymphocytes Relative: 13 %
Lymphs Abs: 0.8 10*3/uL (ref 0.7–4.0)
MCH: 28.9 pg (ref 26.0–34.0)
MCHC: 32.8 g/dL (ref 30.0–36.0)
MCV: 88.2 fL (ref 80.0–100.0)
Monocytes Absolute: 0.4 10*3/uL (ref 0.1–1.0)
Monocytes Relative: 6 %
Neutro Abs: 4.9 10*3/uL (ref 1.7–7.7)
Neutrophils Relative %: 81 %
Platelets: 276 10*3/uL (ref 150–400)
RBC: 4.57 MIL/uL (ref 3.87–5.11)
RDW: 14.1 % (ref 11.5–15.5)
WBC: 6 10*3/uL (ref 4.0–10.5)
nRBC: 0 % (ref 0.0–0.2)

## 2021-07-17 LAB — COMPREHENSIVE METABOLIC PANEL
ALT: 14 U/L (ref 0–44)
AST: 15 U/L (ref 15–41)
Albumin: 3.9 g/dL (ref 3.5–5.0)
Alkaline Phosphatase: 56 U/L (ref 38–126)
Anion gap: 12 (ref 5–15)
BUN: 15 mg/dL (ref 8–23)
CO2: 23 mmol/L (ref 22–32)
Calcium: 9.6 mg/dL (ref 8.9–10.3)
Chloride: 102 mmol/L (ref 98–111)
Creatinine, Ser: 0.79 mg/dL (ref 0.44–1.00)
GFR, Estimated: >60 mL/min (ref >60)
Glucose, Bld: 129 mg/dL — ABNORMAL HIGH (ref 70–99)
Potassium: 3.3 mmol/L — ABNORMAL LOW (ref 3.5–5.1)
Sodium: 137 mmol/L (ref 135–145)
Total Bilirubin: 0.5 mg/dL (ref 0.3–1.2)
Total Protein: 6.5 g/dL (ref 6.5–8.1)

## 2021-07-17 LAB — CBG MONITORING, ED: Glucose-Capillary: 130 mg/dL — ABNORMAL HIGH (ref 70–99)

## 2021-07-17 LAB — URINALYSIS, ROUTINE W REFLEX MICROSCOPIC
Bacteria, UA: NONE SEEN
Bilirubin Urine: NEGATIVE
Glucose, UA: NEGATIVE mg/dL
Hgb urine dipstick: NEGATIVE
Ketones, ur: 80 mg/dL — AB
Leukocytes,Ua: NEGATIVE
Nitrite: NEGATIVE
Protein, ur: 100 mg/dL — AB
Specific Gravity, Urine: 1.03 (ref 1.005–1.030)
pH: 5 (ref 5.0–8.0)

## 2021-07-17 LAB — RAPID URINE DRUG SCREEN, HOSP PERFORMED
Amphetamines: NOT DETECTED
Barbiturates: NOT DETECTED
Benzodiazepines: NOT DETECTED
Cocaine: NOT DETECTED
Opiates: NOT DETECTED
Tetrahydrocannabinol: POSITIVE — AB

## 2021-07-17 LAB — RESP PANEL BY RT-PCR (FLU A&B, COVID) ARPGX2
Influenza A by PCR: NEGATIVE
Influenza B by PCR: NEGATIVE
SARS Coronavirus 2 by RT PCR: NEGATIVE

## 2021-07-17 LAB — TSH: TSH: 1.141 u[IU]/mL (ref 0.350–4.500)

## 2021-07-17 MED ORDER — LEVOTHYROXINE SODIUM 25 MCG PO TABS: 125.0000 ug | ORAL_TABLET | Freq: Every day | ORAL | Status: DC

## 2021-07-17 MED ORDER — ONDANSETRON 4 MG PO TBDP: 4.0000 mg | ORAL_TABLET | Freq: Three times a day (TID) | ORAL | Status: DC | PRN

## 2021-07-17 MED ORDER — ADULT MULTIVITAMIN W/MINERALS CH
ORAL_TABLET | Freq: Every day | ORAL | Status: DC
  Administered 2021-07-17: 1 via ORAL
  Filled 2021-07-17: qty 1

## 2021-07-17 MED ORDER — ONDANSETRON HCL 4 MG/2ML IJ SOLN
4.0000 mg | Freq: Once | INTRAMUSCULAR | Status: AC
  Administered 2021-07-17: 4 mg via INTRAVENOUS
  Filled 2021-07-17: qty 2

## 2021-07-17 MED ORDER — FERROUS SULFATE 325 (65 FE) MG PO TABS: 325.0000 mg | ORAL_TABLET | Freq: Every day | ORAL | Status: DC

## 2021-07-17 MED ORDER — SODIUM CHLORIDE 0.9 % IV BOLUS
1000.0000 mL | Freq: Once | INTRAVENOUS | Status: AC
  Administered 2021-07-17: 1000 mL via INTRAVENOUS

## 2021-07-17 MED ORDER — ACETAMINOPHEN 500 MG PO TABS: 1000.0000 mg | ORAL_TABLET | Freq: Four times a day (QID) | ORAL | Status: DC | PRN

## 2021-07-17 MED ORDER — HYDROXYCHLOROQUINE SULFATE 200 MG PO TABS
200.0000 mg | ORAL_TABLET | Freq: Every day | ORAL | Status: DC
  Administered 2021-07-17: 200 mg via ORAL
  Filled 2021-07-17: qty 1

## 2021-07-17 MED ORDER — ATORVASTATIN CALCIUM 10 MG PO TABS
20.0000 mg | ORAL_TABLET | Freq: Every day | ORAL | Status: DC
  Administered 2021-07-17: 20 mg via ORAL
  Filled 2021-07-17 (×2): qty 2

## 2021-07-17 MED ORDER — IBUPROFEN 400 MG PO TABS: 400.0000 mg | ORAL_TABLET | Freq: Three times a day (TID) | ORAL | Status: DC | PRN

## 2021-07-17 NOTE — ED Notes (Signed)
Pt's daughter provided w/ Visitor Guidelines.  ?

## 2021-07-17 NOTE — ED Provider Notes (Addendum)
[facility] EMERGENCY DEPARTMENT Provider Note   CSN: [id number] Arrival date & time: 07/17/21  0930     History  Chief Complaint  Patient presents with   Altered Mental Status    Jennifer Franco is a 66 y.o. female.  Pt is a 66 yo female with a hx of TIA, depression, high cholesterol, RA, hypothyroidism, sjogren's syndrome,gerd, fibromyalgia, and anxiety.  Pt was confused this morning.  She does not remember yesterday which she spent vomiting.  She was here in November for similar sx.  MRI nl then.  Stroke work up negative. It was thought to be somatic due to severe stress.  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  She has been depressed and has not been going to see her therapist.        Home Medications Prior to Admission medications   Medication Sig Start Date End Date Taking? Authorizing Provider  acetaminophen (TYLENOL) 500 MG tablet Take 1,000 mg by mouth every 6 (six) hours as needed for mild pain.    [provider]  atorvastatin (LIPITOR) 20 MG tablet Take 1 tablet (20 mg total) by mouth daily. 03/31/21 06/29/21  Terrilee Croak, MD  Calcium-Vitamin D 600-200 MG-UNIT tablet Take 1 tablet by mouth daily.    [provider]  Cholecalciferol (VITAMIN D3 PO) Take 1 tablet by mouth daily.    [provider]  Efinaconazole 10 % SOLN Apply 8 mLs topically in the morning and at bedtime. 05/05/21   Felipa Furnace, DPM  ferrous sulfate 325 (65 FE) MG tablet Take 325 mg by mouth daily with breakfast.    [provider]  hydroxychloroquine (PLAQUENIL) 200 MG tablet Take 200 mg by mouth daily. 06/05/19   [provider]  ibuprofen (ADVIL) 200 MG tablet Take 400 mg by mouth every 8 (eight) hours as needed for moderate pain.     [provider]  levothyroxine (SYNTHROID) 125 MCG tablet Take 125 mcg by mouth daily before breakfast.  02/04/19   [provider]  Multiple Vitamin (MULTIVITAMIN) tablet Take 1 tablet  by mouth daily.    [provider]  ondansetron (ZOFRAN ODT) 4 MG disintegrating tablet Take 1 tablet (4 mg total) by mouth every 8 (eight) hours as needed for nausea or vomiting. 12/06/18   Volanda Napoleon, PA-C      Allergies    Etodolac and Topamax [topiramate]    Review of Systems   Review of Systems  Gastrointestinal:  Positive for nausea and vomiting.  Psychiatric/Behavioral:  The patient is nervous/anxious.   All other systems reviewed and are negative.  Physical Exam Updated Vital Signs BP 123/63    Pulse (!) 44    Temp 98.6 F (37 C)    Resp 11    SpO2 99%  Physical Exam Vitals and nursing note reviewed.  Constitutional:      Appearance: Normal appearance.  HENT:     Head: Normocephalic and atraumatic.     Right Ear: External ear normal.     Left Ear: External ear normal.     Nose: Nose normal.     Mouth/Throat:     Mouth: Mucous membranes are moist.     Pharynx: Oropharynx is clear.  Eyes:     Extraocular Movements: Extraocular movements intact.     Conjunctiva/sclera: Conjunctivae normal.     Pupils: Pupils are equal, round, and reactive to light.  Cardiovascular:     Rate and Rhythm: Regular rhythm. Bradycardia  present.     Pulses: Normal pulses.     Heart sounds: Normal heart sounds.  Pulmonary:     Effort: Pulmonary effort is normal.     Breath sounds: Normal breath sounds.  Abdominal:     General: Abdomen is flat. Bowel sounds are normal.     Palpations: Abdomen is soft.  Musculoskeletal:        General: Normal range of motion.     Cervical back: Normal range of motion and neck supple.  Skin:    General: Skin is warm.     Capillary Refill: Capillary refill takes less than 2 seconds.  Neurological:     General: No focal deficit present.     Mental Status: She is alert and oriented to person, place, and time.  Psychiatric:        Mood and Affect: Mood normal.        Behavior: Behavior normal.    ED Results / Procedures / Treatments    Labs (all labs ordered are listed, but only abnormal results are displayed) Labs Reviewed  COMPREHENSIVE METABOLIC PANEL - Abnormal; Notable for the following components:      Result Value   Potassium 3.3 (*)    Glucose, Bld 129 (*)    All other components within normal limits  URINALYSIS, ROUTINE W REFLEX MICROSCOPIC - Abnormal; Notable for the following components:   APPearance HAZY (*)    Ketones, ur 80 (*)    Protein, ur 100 (*)    All other components within normal limits  CBG MONITORING, ED - Abnormal; Notable for the following components:   Glucose-Capillary 130 (*)    All other components within normal limits  CBC WITH DIFFERENTIAL/PLATELET  TSH    EKG EKG Interpretation  Date/Time:  Saturday July 17 2021 11:16:38 EST Ventricular Rate:  46 PR Interval:  140 QRS Duration: 141 QT Interval:  479 QTC Calculation: 419 R Axis:   13 Text Interpretation: Sinus bradycardia Nonspecific intraventricular conduction delay Probable anteroseptal infarct, old No significant change since last tracing Confirmed by Isla Pence [phone]) on 07/17/2021 11:32:14 AM  Radiology DG Chest 2 View  Result Date: 07/17/2021 CLINICAL DATA:  66 year old female with history of altered mental status. EXAM: CHEST - 2 VIEW COMPARISON:  Chest x-ray 03/30/2021. FINDINGS: Lung volumes are normal. No consolidative airspace disease. No pleural effusions. No pneumothorax. No pulmonary nodule or mass noted. Pulmonary vasculature and the cardiomediastinal silhouette are within normal limits. Atherosclerotic calcifications in the thoracic aorta. IMPRESSION: 1.  No radiographic evidence of acute cardiopulmonary disease. 2. Aortic atherosclerosis. Electronically Signed   By: Vinnie Langton M.D.   On: 07/17/2021 11:03   CT Head Wo Contrast  Result Date: 07/17/2021 CLINICAL DATA:  Mental status change.  History of TIA. EXAM: CT HEAD WITHOUT CONTRAST TECHNIQUE: Contiguous axial images were obtained from the base  of the skull through the vertex without intravenous contrast. RADIATION DOSE REDUCTION: This exam was performed according to the departmental dose-optimization program which includes automated exposure control, adjustment of the mA and/or kV according to patient size and/or use of iterative reconstruction technique. COMPARISON:  03/30/2021 FINDINGS: Brain: No mass lesion, hemorrhage, hydrocephalus, acute infarct, intra-axial, or extra-axial fluid collection. Vascular: No hyperdense vessel or unexpected calcification. Skull: No significant soft tissue swelling.  No skull fracture. Sinuses/Orbits: Normal imaged portions of the orbits and globes. Clear paranasal sinuses and mastoid air cells. Cerumen within the left external ear canal. Other: None. IMPRESSION: Normal head CT. Electronically Signed  By: Abigail Miyamoto M.D.   On: 07/17/2021 11:10    Procedures Procedures    Medications Ordered in ED Medications  sodium chloride 0.9 % bolus 1,000 mL (0 mLs Intravenous Stopped 07/17/21 1341)  ondansetron (ZOFRAN) injection 4 mg (4 mg Intravenous Given 07/17/21 1116)    ED Course/ Medical Decision Making/ A&P                           Medical Decision Making Amount and/or Complexity of Data Reviewed Labs: ordered. Radiology: ordered.  Risk OTC drugs. Prescription drug management.   This patient presents to the ED for concern of amnesia, this involves an extensive number of treatment options, and is a complaint that carries with it a high risk of complications and morbidity.  The differential diagnosis includes tia, cva, electrolyte abn, infection   Co morbidities that complicate the patient evaluation  TIA, depression, high cholesterol, RA, hypothyroidism, sjogren's syndrome,gerd, fibromyalgia, and anxiety   Additional history obtained:  Additional history obtained from epic chart review External records from outside source obtained and reviewed including daughter   Lab Tests:  I  Ordered, and personally interpreted labs.  The pertinent results include:  cbc nl,    Imaging Studies ordered:  I ordered imaging studies including ct head, cxr  I independently visualized and interpreted imaging which showed ct head nl, cxr:   IMPRESSION:  1.  No radiographic evidence of acute cardiopulmonary disease.  2. Aortic atherosclerosis.      I agree with the radiologist interpretation   Cardiac Monitoring:  The patient was maintained on a cardiac monitor.  I personally viewed and interpreted the cardiac monitored which showed an underlying rhythm of: sinus bradycardia (chronic)   Test Considered:  MRI brain, but no neurologic sx    Consultations Obtained:  I requested consultation with the TTS,  and discussed lab and imaging findings as well as pertinent plan - they recommend: eval pending   Problem List / ED Course:  Transient global amnesia:  work up today negative for anything acute.  She just had a full stroke work up a few months ago.  I think sx are likely somatic.  Pt's daughter tells me that pt has expressed some SI.  She does have guns in the house.  Daughter tells me that she shuts herself in her room and her mood switches on and off.  Pt is willing to speak with TTS.   Reevaluation:  After the interventions noted above, I reevaluated the patient and found that they have :improved   Social Determinants of Health:  Lives at home.  Takes care of her elderly parent.     Dispostion:  After consideration of the diagnostic results and the patients response to treatment, I feel that the patent would benefit from TTS eval..          Final Clinical Impression(s) / ED Diagnoses Final diagnoses:  Transient global amnesia  Dehydration  Nausea and vomiting, unspecified vomiting type    Rx / DC Orders ED Discharge Orders     None         Isla Pence, MD 07/17/21 [place], Chrishawn Kring, MD 07/17/21 1428

## 2021-07-17 NOTE — ED Notes (Signed)
Pt remains calm and pleasant.  Daughter at bedside.  Still waiting on TTS assessment.  ?

## 2021-07-17 NOTE — BH Assessment (Signed)
Comprehensive Clinical Assessment (CCA) Note  07/18/2021 Jennifer Franco [id number]  Discharge Disposition: Leandro Reasoner, NP, reviewed pt's chart and information and determined pt can be psych cleared with information for outpatient therapy and medication management services.  This information was relayed to pt's team at [place].  The patient demonstrates the following risk factors for suicide: Chronic risk factors for suicide include: psychiatric disorder of MDD, recurrent, moderate . Acute risk factors for suicide include: family or marital conflict. Protective factors for this patient include: positive social support and responsibility to others (children, family). Considering these factors, the overall suicide risk at this point appears to be none. Patient is not appropriate for outpatient follow up.  Therefore, no sitter is recommended for suicide precautions.  [place] ED from 07/17/2021 in [place] [facility] to Hosp-Admission (Discharged) from 03/30/2021 in [facility] from 01/20/2021 in [facility]  C-SSRS RISK CATEGORY No Risk No Risk No Risk     Chief Complaint:  Chief Complaint  Patient presents with   Altered Mental Status   Visit Diagnosis: F33.1, MDD, recurrent, moderate   CCA Screening, Triage and Referral (STR) Jennifer Franco is a 66 year old patient who was brought to the [facility] due to confusion and memory concerns. Pt states, "I've been losing my memory for a couple of years. (My children) think I'm so depressed and they think I need to go on medication. I want to do my best for (my mother)."   Pt denies SI, a hx of SI, any prior attempts to kill herself, a plan to kill herself, or any prior hospitalizations for mental health concerns. Pt denies HI, AVH, NSSIB, engagement with the legal system, or SA. She shares her mother owns a gun but that she  took it from her and put it in a safe that only she knows the combination to "because I didn't think it was a good idea for a woman with dementia to be carrying around a gun."  Pt is oriented x5. Her recent/remote memory is impaired; pt had difficulties remembering when she was in the hospital (she was in the hospital in November; she thought it was in September), the name of the dx she has (fibromyalgia), and what she did with her children for her twins' birthday 10 days ago. Pt was cooperative, though tearful at times, throughout the assessment process. Pt declined to provide verbal consent for clinician to make contact with her children, stating that they are "biased." Pt's insight, judgement, and impulse control is currently fair - good.  Patient Reported Information How did you hear about [place]? Family/Friend  What Is the Reason for Your Visit/Call Today? Pt states, "I've been losing my memory for a couple of years. (My children) think I'm so depressed and they think I need to go on medication. I want to do my best for (my mother)." Pt denies SI, a hx of SI, any prior attempts to kill herself, a plan to kill herself, or any prior hospitalizations for mental health concerns. Pt denies HI, AVH, NSSIB, engagement with the legal system, or SA. She shares her mother owns a gun but that she took it from her and put it in a safe that only she knows the combination to "because I didn't think it was a good idea for a woman with dementia to be carrying around a gun."  How Long Has This Been Causing You Problems? 1-6 months  What Do  You Feel Would Help You the Most Today? Social Support; Stress Management   Have You Recently Had Any Thoughts About Hurting Yourself? No  Are You Planning to Commit Suicide/Harm Yourself At This time? No   Have you Recently Had Thoughts About [place]? No  Are You Planning to Harm Someone at This Time? No  Explanation: No data recorded  Have You Used Any  Alcohol or Drugs in the Past 24 Hours? No  How Long Ago Did You Use Drugs or Alcohol? No data recorded What Did You Use and How Much? No data recorded  Do You Currently Have a Therapist/Psychiatrist? No  Name of Therapist/Psychiatrist: No data recorded  Have You Been Recently Discharged From Any Office Practice or Programs? No  Explanation of Discharge From Practice/Program: No data recorded    CCA Screening Triage Referral Assessment Type of Contact: Tele-Assessment  Telemedicine Service Delivery: Telemedicine service delivery: This service was provided via telemedicine using a 2-way, interactive audio and video technology  Is this Initial or Reassessment? Initial Assessment  Date Telepsych consult ordered in [facility]:  07/17/21  Time Telepsych consult ordered in [facility]:  1418  Location of Assessment: [facility] ED  Provider Location: [facility] Assessment Services   Collateral Involvement: Pt declined to provide verbal consent for clinician to make contact with her family.   Does Patient Have a Stage manager Guardian? No data recorded Name and Contact of Legal Guardian: No data recorded If Minor and Not Living with Parent(s), Who has Custody? N/A  Is CPS involved or ever been involved? Never  Is APS involved or ever been involved? Never   Patient Determined To Be At Risk for Harm To Self or Others Based on Review of Patient Reported Information or Presenting Complaint? No  Method: No data recorded Availability of Means: No data recorded Intent: No data recorded Notification Required: No data recorded Additional Information for Danger to Others Potential: No data recorded Additional Comments for Danger to Others Potential: No data recorded Are There Guns or Other Weapons in Your Home? No data recorded Types of Guns/Weapons: No data recorded Are These Weapons Safely Secured?                            No data recorded Who Could Verify You Are Able To Have These Secured: No  data recorded Do You Have any Outstanding Charges, Pending Court Dates, Parole/Probation? No data recorded Contacted To Inform of Risk of Harm To Self or Others: -- (N/A)    Does Patient Present under Involuntary Commitment? No  IVC Papers Initial File Date: No data recorded  [place] of Residence: [place]   Patient Currently Receiving the Following Services: Not Receiving Services   Determination of Need: Routine (7 days)   Options For Referral: Medication Management; Outpatient Therapy     CCA Biopsychosocial Patient Reported Schizophrenia/Schizoaffective Diagnosis in Past: No   Strengths: Pt cares about her family. She wants to be well so she can take care of her mother. Pt has consistent housing and transportation.   Mental Health Symptoms Depression:   Fatigue; Increase/decrease in appetite; Weight gain/loss; Tearfulness   Duration of Depressive symptoms:  Duration of Depressive Symptoms: Greater than two weeks   Mania:   None   Anxiety:    Worrying   Psychosis:   None   Duration of Psychotic symptoms:    Trauma:   None   Obsessions:   None  Compulsions:   None   Inattention:   None   Hyperactivity/Impulsivity:   None   Oppositional/Defiant Behaviors:   None   Emotional Irregularity:   None   Other Mood/Personality Symptoms:   None noted    Mental Status Exam Appearance and self-care  Stature:   Average   Weight:   Average weight   Clothing:   -- (Pt is dressed in a hospital gown)   Grooming:   Normal   Cosmetic use:   Age appropriate   Posture/gait:   Normal   Motor activity:   Not Remarkable   Sensorium  Attention:   Confused   Concentration:   Scattered   Orientation:   X5   Recall/memory:   Defective in Short-term; Defective in Remote   Affect and Mood  Affect:   Anxious; Depressed   Mood:   Anxious   Relating  Eye contact:   Normal   Facial expression:   Responsive   Attitude toward  examiner:   Cooperative   Thought and Language  Speech flow:  Pressured   Thought content:   Appropriate to Mood and Circumstances   Preoccupation:   None   Hallucinations:   None   Organization:  No data recorded  [employer] of Knowledge:   Average   Intelligence:   Above Average   Abstraction:   Normal   Judgement:   Good   Reality Testing:   Realistic   Insight:   Gaps   Decision Making:   Normal   Social Functioning  Social Maturity:   Responsible   Social Judgement:   Normal   Stress  Stressors:   Family conflict; Grief/losses; Housing   Coping Ability:   Exhausted; Overwhelmed   Skill Deficits:   None   Supports:   Family; Friends/Service system     Religion: Religion/Spirituality Are You A Religious Person?:  (Pt shares she is spiritual) How Might This Affect Treatment?: Not assessed  Leisure/Recreation: Leisure / Recreation Do You Have Hobbies?:  (Not assessed)  Exercise/Diet: Exercise/Diet Do You Exercise?: Yes What Type of Exercise Do You Do?: Run/Walk How Many Times a Week Do You Exercise?:  (Not assessed) Have You Gained or Lost A Significant Amount of Weight in the Past Six Months?: Yes-Lost Number of Pounds Lost?:  (Pt recently lost 160+ lb over 18-24 months) Do You Follow a Special Diet?: Yes Type of Diet: Pt has been trying to watch what she eats Do You Have Any Trouble Sleeping?: No   CCA Employment/Education Employment/Work Situation: Employment / Work Situation Employment Situation: On disability Why is Patient on Disability: Due to fibromyalgia dx How Long has Patient Been on Disability: 12 years Patient's Job has Been Impacted by Current Illness: Yes Describe how Patient's Job has Been Impacted: Pt had to resign from teaching Has Patient ever Been in the [employer]?: No  Education: Education Is Patient Currently Attending School?: No Last Grade Completed: 16 Did You Attend College?:  Yes What Type of College Degree Do you Have?: Not assessed Did You Have An Individualized Education Program (IIEP): No Did You Have Any Difficulty At School?: No Patient's Education Has Been Impacted by Current Illness: No   CCA Family/Childhood History Family and Relationship History: Family history Marital status: Divorced Divorced, when?: 2022 What types of issues is patient dealing with in the relationship?: Pt and her ex-husband were separated for 12 years prior to him filing for divorce last year Additional relationship information: None noted Does patient  have children?: Yes How many children?: 3 How is patient's relationship with their children?: Pt has a strong and positive relationship with her children  Childhood History:  Childhood History By whom was/is the patient raised?: Mother, Other (Comment) (Pt saw her father during the summertime) Did patient suffer any verbal/emotional/physical/sexual abuse as a child?:  (Not assessed) Did patient suffer from severe childhood neglect?:  (Not assessed) Has patient ever been sexually abused/assaulted/raped as an adolescent or adult?:  (Not assessed) Was the patient ever a victim of a crime or a disaster?:  (Not assessed) Witnessed domestic violence?:  (Not assessed) Has patient been affected by domestic violence as an adult?:  (Not assessed)  Child/Adolescent Assessment:     CCA Substance Use Alcohol/Drug Use: Alcohol / Drug Use Pain Medications: See MAR Prescriptions: See MAR Over the Counter: See MAR History of alcohol / drug use?: No history of alcohol / drug abuse Longest period of sobriety (when/how long): Not assessed Negative Consequences of Use:  (Pt denies) Withdrawal Symptoms:  (Pt denies)                         ASAM's:  Six Dimensions of Multidimensional Assessment  Dimension 1:  Acute Intoxication and/or Withdrawal Potential:      Dimension 2:  Biomedical Conditions and Complications:       Dimension 3:  Emotional, Behavioral, or Cognitive Conditions and Complications:     Dimension 4:  Readiness to Change:     Dimension 5:  Relapse, Continued use, or Continued Problem Potential:     Dimension 6:  Recovery/Living Environment:     ASAM Severity Score:    ASAM Recommended Level of Treatment: ASAM Recommended Level of Treatment:  (Pt denies)   Substance use Disorder (SUD) Substance Use Disorder (SUD)  Checklist Symptoms of Substance Use:  (Pt denies)  Recommendations for Services/Supports/Treatments: Recommendations for Services/Supports/Treatments Recommendations For Services/Supports/Treatments: Medication Management, Individual Therapy  Discharge Disposition: Leandro Reasoner, NP, reviewed pt's chart and information and determined pt can be psych cleared with information for outpatient therapy and medication management services.  This information was relayed to pt's team at [facility].  DSM5 Diagnoses: Patient Active Problem List   Diagnosis Date Noted   TIA (transient ischemic attack) 03/30/2021   Status post laparoscopic-assisted sigmoidectomy 08/01/2019     Referrals to Alternative Service(s): Referred to Alternative Service(s):   Place:   Date:   Time:    Referred to Alternative Service(s):   Place:   Date:   Time:    Referred to Alternative Service(s):   Place:   Date:   Time:    Referred to Alternative Service(s):   Place:   Date:   Time:     Dannielle Burn, LMFT

## 2021-07-17 NOTE — ED Triage Notes (Signed)
Pt states she woke up this morning with confusion.  Reports history of TIA.  States she doesn't remember events of yesterday and daughter reports she had vomiting yesterday that pt doesn't remember.  No arm drift. ?

## 2021-07-17 NOTE — ED Notes (Addendum)
Pt denies SI/HI/AVH.  + Caregiver Fatigue.  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  Pt remains in gown, leggings, and tennis shoes until TTS disposition.  Pt is aware all belongings will need to be removed from the room, if TTS decides to keep her.  Pt is pleasant, calm, and cooperative.  ?

## 2021-07-18 NOTE — ED Notes (Signed)
Pt called daughter stating she was likely to go home and communicated to daughter that she can come pick her up. ?

## 2021-07-18 NOTE — ED Notes (Signed)
Patient verbalizes understanding of d/c instructions. Opportunities for questions and answers were provided. Pt d/c from ED and this RN ambulated with pt to lobby where daughter picked pt up.  ?

## 2021-07-29 ENCOUNTER — Encounter: Payer: Self-pay | Admitting: Neurology

## 2021-07-29 ENCOUNTER — Other Ambulatory Visit: Payer: Self-pay

## 2021-07-29 ENCOUNTER — Ambulatory Visit: Payer: Medicare PPO | Admitting: Neurology

## 2021-07-29 VITALS — BP 113/63 | HR 49 | Ht 63.0 in | Wt 152.0 lb

## 2021-07-29 DIAGNOSIS — G3184 Mild cognitive impairment, so stated: Secondary | ICD-10-CM

## 2021-07-29 DIAGNOSIS — R413 Other amnesia: Secondary | ICD-10-CM | POA: Diagnosis not present

## 2021-07-29 DIAGNOSIS — G454 Transient global amnesia: Secondary | ICD-10-CM | POA: Diagnosis not present

## 2021-07-29 NOTE — Progress Notes (Signed)
?[facility] ?[address] ?[place]. [place] [address] ?[phone] ? ?     OFFICE CONSULT NOTE ? ?Jennifer Franco ?Date of Birth:  14-Sep-1955 ?Medical Record Number:  [id number]  ? ?Referring MD: Emelda Brothers ? ?Reason for Referral: Memory loss versus transient global amnesia ? ?HPI: Jennifer Franco is a 66 year old pleasant lady seen today for initial office consultation visit for memory loss episode.  She has past medical history of hypothyroidism, rheumatoid arthritis, morbid obesity, Chardon syndrome, reflux esophagitis who presented on 03/30/2021 to [facility] emergency room with disorientation and confusion and memory difficulties.  She repeatedly asked same question over and over again symptoms are concerning for transient global amnesia and CT scan of the head was done which was negative for acute abnormality.  She was transferred to [facility] for further evaluation.  Upon speaking with the daughter and obtaining more detailed history patient has a history of baseline depression.  She actually had very poor recollection of events for the last several days and actually went up 2 weeks of Thanksgiving.  She did not remember that they had a Thanksgiving meal and events of that day.  She did endorse significant stress particularly due to concern about taking care of her mother with advanced dementia.  When patient was evaluated by me and her daughter at the bedside she actually stated she had no recollection for events going back up to 1 week and even longer.  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  With 3 word recall was 1/3 and she struggled to name animals.  MRI scan of the brain showed no acute abnormality.  There was questionable punctate hyperintensity in the right hippocampus but not a definite finding.  MR angiogram showed no acute abnormality in the brain carotid ultrasound showed no significant  extracranial stenosis.  LDL cholesterol 128 mg percent.  Hemoglobin A1c was 5.8.  Vitamin B12 levels were low at 135 and she was started on replacement.  Patient was seen by psychiatry and felt underlying stress and depression could have contributed.  Patient was discharged home and was doing better but returned back to the ER on 07/17/2021 for confusion and did not remember events from the day before.  She did have a lot of vomiting on that day.  ER physician felt her symptoms were psychosomatic due to severe stress CT scan of the head was obtained which showed no acute abnormality.  Patient feels this episode was different and she was clearly aware of what was happening and was just not feeling well and was nauseous.  She could remember events of the hospitalization..  On cognitive evaluation today in the office she scored 0/3 on recall but was able to name 12 animals which can walk on 4 legs and clock drawing was 4/4.  On Mini-Mental status exam she scored 29/30.  On geriatric depression scale she scored 7 suggestive of mild depression. ? ?ROS:   ?14 system review of systems is positive for memory difficulties, feeling overwhelmed, stress, depression, nausea all other systems negative ? ?PMH:  ?Past Medical History:  ?Diagnosis Date  ? Anemia   ? Anginal pain ([place])   ? anxiety  ? Anxiety   ? 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 Chills   ? CTS (carpal tunnel syndrome)   ? LEFT  ? Cyclical vomiting   ? Depression   ? Diarrhea, unspecified   ?  Diverticulitis   ? Fibromyalgia   ? Hemorrhoids   ? History of adenomatous polyp of colon   ? History of morbid obesity   ? Hypothyroid   ? Hypothyroidism   ? Intractable nausea and vomiting   ? Irregular bowel habits   ? Joint pain   ? LLQ abdominal pain   ? Morbid obesity ([place])   ? Nausea and vomiting   ? Onychomycosis   ? RA (rheumatoid arthritis) ([place])   ? Reflux esophagitis   ? Reflux esophagitis   ? Sjogren's syndrome ([place])   ? Tinea corporis   ? Unspecified mood (affective)  disorder ([place])   ? Weight loss   ? ? ?Social History:  ?Social History  ? ?Socioeconomic History  ? Marital status: Divorced  ?  Spouse name: Not on file  ? Number of children: Not on file  ? Years of education: Not on file  ? Highest education level: Not on file  ?Occupational History  ? Not on file  ?Tobacco Use  ? Smoking status: Former  ? Smokeless tobacco: Never  ?Vaping Use  ? Vaping Use: Never used  ?Substance and Sexual Activity  ? Alcohol use: Yes  ?  Comment: rare  ? Drug use: No  ? Sexual activity: Not on file  ?Other Topics Concern  ? Not on file  ?Social History Narrative  ? Not on file  ? ?Social Determinants of Health  ? ?Financial Resource Strain: Not on file  ?Food Insecurity: Not on file  ?Transportation Needs: Not on file  ?Physical Activity: Not on file  ?Stress: Not on file  ?Social Connections: Not on file  ?Intimate Partner Violence: Not on file  ? ? ?Medications:   ?Current Outpatient Medications on File Prior to Visit  ?Medication Sig Dispense Refill  ? acetaminophen (TYLENOL) 500 MG tablet Take 1,000 mg by mouth every 6 (six) hours as needed for mild pain.    ? Calcium-Vitamin D 600-200 MG-UNIT tablet Take 1 tablet by mouth daily.    ? Cholecalciferol (VITAMIN D3 PO) Take 1 tablet by mouth daily.    ? Efinaconazole 10 % SOLN Apply 8 mLs topically in the morning and at bedtime. 8 mL 3  ? ferrous sulfate 325 (65 FE) MG tablet Take 325 mg by mouth daily with breakfast.    ? hydroxychloroquine (PLAQUENIL) 200 MG tablet Take 200 mg by mouth daily.    ? ibuprofen (ADVIL) 200 MG tablet Take 400 mg by mouth every 8 (eight) hours as needed for moderate pain.     ? levothyroxine (SYNTHROID) 125 MCG tablet Take 125 mcg by mouth daily before breakfast.     ? Multiple Vitamin (MULTIVITAMIN) tablet Take 1 tablet by mouth daily.    ? ondansetron (ZOFRAN ODT) 4 MG disintegrating tablet Take 1 tablet (4 mg total) by mouth every 8 (eight) hours as needed for nausea or vomiting. 4 tablet 0  ? atorvastatin  (LIPITOR) 20 MG tablet Take 1 tablet (20 mg total) by mouth daily. 30 tablet 2  ? ?No current facility-administered medications on file prior to visit.  ? ? ?Allergies:   ?Allergies  ?Allergen Reactions  ? Etodolac Diarrhea  ? Topamax [Topiramate] Other (See Comments)  ?  Brain flutters   ? ? ?Physical Exam ?General: well developed, well nourished middle-aged African-American lady, seated, in no evident distress ?Head: head normocephalic and atraumatic.   ?Neck: supple with no carotid or supraclavicular bruits ?Cardiovascular: regular rate and rhythm, no murmurs ?Musculoskeletal: no  deformity ?Skin:  no rash/petichiae ?Vascular:  Normal pulses all extremities ? ?Neurologic Exam ?Mental Status: Awake and fully alert. Oriented to place and time. Recent and remote memory intact. Attention span, concentration and fund of knowledge appropriate. Mood and affect appropriate.  Mini-Mental status exam scored 29/30 with only 1 deficit in recall.  Geriatric depression scale score 7 for history of only mild depression.  Able to name 12 animals which can walk on 4 legs.  Clock drawing 4/4. ?Cranial Nerves: Fundoscopic exam reveals sharp disc margins. Pupils equal, briskly reactive to light. Extraocular movements full without nystagmus. Visual fields full to confrontation. Hearing intact. Facial sensation intact. Face, tongue, palate moves normally and symmetrically.  ?Motor: Normal bulk and tone. Normal strength in all tested extremity muscles. ?Sensory.: intact to touch , pinprick , position and vibratory sensation.  ?Coordination: Rapid alternating movements normal in all extremities. Finger-to-nose and heel-to-shin performed accurately bilaterally. ?Gait and Station: Arises from chair without difficulty. Stance is normal. Gait demonstrates normal stride length and balance . Able to heel, toe and tandem walk with difficulty.  ?Reflexes: 1+ and symmetric. Toes downgoing.  ? ?  ? ? ?ASSESSMENT: 66 year old African-American lady  with episode of memory loss for several weeks in November 2022 of unclear etiology possibly transient global amnesia but underlying significant stress may have also contributed. ? ? ? ? ?PLAN:  I had a long

## 2021-07-29 NOTE — Patient Instructions (Signed)
Depression I had a long discussion with the patient regarding her symptoms of memory loss episode which is of unclear etiology.  Transient global amnesia is a possibility but the duration of the memory loss episode is quite prolonged and lack of improvement over time also argues against it.  She likely has mild cognitive impairment which may have a contribution from suboptimally treated depression as well.  I recommend she increase participation in cognitively challenging activities like solving crossword puzzles, playing bridge and sudoku.  We also discussed memory compensation strategies.  No further neurological work-up is necessary.  Follow-up with a psychiatrist for optimal treatment of depression.  She may return for follow-up in the future only as needed. ?Memory Compensation Strategies ? ?Use "WARM" strategy. ? W= write it down ? A= associate it ? R= repeat it ? M= make a mental note ? ?2.   You can keep a Social worker. ? Use a 3-ring notebook with sections for the following: calendar, important names and phone numbers,  medications, doctors' names/phone numbers, lists/reminders, and a section to journal what you did  each day.  ? ?3.    Use a calendar to write appointments down. ? ?4.    Write yourself a schedule for the day. ? This can be placed on the calendar or in a separate section of the Memory Notebook.  Keeping a  regular schedule can help memory. ? ?5.    Use medication organizer with sections for each day or morning/evening pills. ? You may need help loading it ? ?6.    Keep a basket, or pegboard by the door. ? Place items that you need to take out with you in the basket or on the pegboard.  You may also want to  include a message board for reminders. ? ?7.    Use sticky notes. ? Place sticky notes with reminders in a place where the task is performed.  For example: " turn off the  stove" placed by the stove, "lock the door" placed on the door at eye level, " take your medications" on  the  bathroom mirror or by the place where you normally take your medications. ? ?8.    Use alarms/timers. ? Use while cooking to remind yourself to check on food or as a reminder to take your medicine, or as a  reminder to make a call, or as a reminder to perform another task, etc. ? ?

## 2021-08-03 DIAGNOSIS — H00025 Hordeolum internum left lower eyelid: Secondary | ICD-10-CM | POA: Diagnosis not present

## 2021-08-07 DIAGNOSIS — H1013 Acute atopic conjunctivitis, bilateral: Secondary | ICD-10-CM | POA: Diagnosis not present

## 2021-08-10 DIAGNOSIS — F329 Major depressive disorder, single episode, unspecified: Secondary | ICD-10-CM | POA: Diagnosis not present

## 2021-08-18 ENCOUNTER — Ambulatory Visit: Payer: Medicare PPO | Admitting: Podiatry

## 2021-08-18 ENCOUNTER — Encounter: Payer: Self-pay | Admitting: Podiatry

## 2021-08-18 DIAGNOSIS — F329 Major depressive disorder, single episode, unspecified: Secondary | ICD-10-CM | POA: Insufficient documentation

## 2021-08-18 DIAGNOSIS — Z860100 Personal history of colon polyps, unspecified: Secondary | ICD-10-CM | POA: Insufficient documentation

## 2021-08-18 DIAGNOSIS — M069 Rheumatoid arthritis, unspecified: Secondary | ICD-10-CM | POA: Insufficient documentation

## 2021-08-18 DIAGNOSIS — B351 Tinea unguium: Secondary | ICD-10-CM

## 2021-08-18 DIAGNOSIS — H43399 Other vitreous opacities, unspecified eye: Secondary | ICD-10-CM | POA: Insufficient documentation

## 2021-08-18 DIAGNOSIS — Z87898 Personal history of other specified conditions: Secondary | ICD-10-CM | POA: Insufficient documentation

## 2021-08-18 DIAGNOSIS — E785 Hyperlipidemia, unspecified: Secondary | ICD-10-CM | POA: Insufficient documentation

## 2021-08-18 NOTE — Patient Instructions (Signed)

## 2021-08-19 DIAGNOSIS — E039 Hypothyroidism, unspecified: Secondary | ICD-10-CM | POA: Diagnosis not present

## 2021-08-19 DIAGNOSIS — K5792 Diverticulitis of intestine, part unspecified, without perforation or abscess without bleeding: Secondary | ICD-10-CM | POA: Diagnosis not present

## 2021-08-19 DIAGNOSIS — Z9049 Acquired absence of other specified parts of digestive tract: Secondary | ICD-10-CM | POA: Diagnosis not present

## 2021-08-19 DIAGNOSIS — M160 Bilateral primary osteoarthritis of hip: Secondary | ICD-10-CM | POA: Diagnosis not present

## 2021-08-19 DIAGNOSIS — M3500 Sicca syndrome, unspecified: Secondary | ICD-10-CM | POA: Diagnosis not present

## 2021-08-24 ENCOUNTER — Inpatient Hospital Stay: Payer: Medicare PPO | Admitting: Neurology

## 2021-08-28 NOTE — Progress Notes (Signed)
?  Subjective:  ?Patient ID: Jennifer Franco, female    DOB: 1956/03/17,  MRN: [id number] ? ?Jennifer Franco presents to clinic today for thick, elongated toenails R 3rd toe which are tender when wearing enclosed shoe gear. She has been prescribed Jublia and states she hasn't been using it consistently, but the nail has lightened up a bit. Patient would like to discuss other treatment options available for onychomycosis. ? ?PCP is Kelton Pillar, MD , and last visit was March, 2023. ? ?Allergies  ?Allergen Reactions  ? Etodolac Diarrhea  ? Topamax [Topiramate] Other (See Comments)  ?  Brain flutters   ? ? ?Review of Systems: Negative except as noted in the HPI. ? ?Objective: No changes noted in today's physical examination. ? ?Objective:  ? ?Vascular Examination: ?Vascular status intact b/l with palpable pedal pulses. Pedal hair present b/l. CFT immediate b/l. No edema. No pain with calf compression b/l. Skin temperature gradient WNL b/l.  ? ?Neurological Examination: ?Sensation grossly intact b/l with 10 gram monofilament. Vibratory sensation intact b/l.  ? ?Dermatological Examination: ?Pedal skin with normal turgor, texture and tone b/l. Toenails right hallux, right 2nd, 3rd, 5th digits thick, discolored, elongated with subungual debris and pain on dorsal palpation. No hyperkeratotic lesions noted b/l.  ? ?Musculoskeletal Examination: ?Muscle strength 5/5 to b/l LE. No pain, crepitus or joint limitation noted with ROM bilateral LE. ? ?Last A1c:   ? ?  Latest Ref Rng & Units 03/31/2021  ?  2:10 AM  ?Hemoglobin A1C  ?Hemoglobin-A1c 4.8 - 5.6 % 5.8    ?  ?Assessment/Plan: ?1. Onychomycosis due to dermatophyte   ?  ? ?-Patient was evaluated and treated. All patient's and/or POA's questions/concerns answered on today's visit. ?-Discussed topical, laser and oral medication. Patient opted for laser therapy. Continue Jublia topically. Discussed cost and treatment schedule. Patient will schedule first session  at his/her convenience. Information dispensed today. ?-Mycotic toenails R hallux, R 2nd toe, R 3rd toe, and R 5th toe were debrided in length and girth with sterile nail nippers and dremel without iatrogenic bleeding. ?-Patient/POA to call should there be question/concern in the interim.  ? ?Return in about 6 months (around 02/17/2022). ? ?Marzetta Board, DPM  ?

## 2021-09-08 ENCOUNTER — Telehealth: Payer: Self-pay | Admitting: *Deleted

## 2021-09-08 NOTE — Telephone Encounter (Addendum)
Received surgical clearance request from [facility]. Patent will be having left total hip replacement by Dr. Floyde Parkins. Dr. Leonie Man completed the form. He confirmed she is optimized for surgery from a neurological standpoint. The form has been faxed to [phone], Atten: Sherri. ?

## 2021-09-10 DIAGNOSIS — B351 Tinea unguium: Secondary | ICD-10-CM | POA: Diagnosis not present

## 2021-09-10 DIAGNOSIS — F329 Major depressive disorder, single episode, unspecified: Secondary | ICD-10-CM | POA: Diagnosis not present

## 2021-09-10 DIAGNOSIS — H00019 Hordeolum externum unspecified eye, unspecified eyelid: Secondary | ICD-10-CM | POA: Diagnosis not present

## 2021-09-21 ENCOUNTER — Ambulatory Visit: Payer: Medicare PPO | Admitting: Podiatry

## 2021-09-21 ENCOUNTER — Encounter: Payer: Self-pay | Admitting: Podiatry

## 2021-09-21 DIAGNOSIS — L600 Ingrowing nail: Secondary | ICD-10-CM

## 2021-09-24 ENCOUNTER — Ambulatory Visit (INDEPENDENT_AMBULATORY_CARE_PROVIDER_SITE_OTHER): Payer: Medicare PPO

## 2021-09-24 DIAGNOSIS — B351 Tinea unguium: Secondary | ICD-10-CM

## 2021-09-24 NOTE — Progress Notes (Signed)
Patient presents today for the 1st laser treatment. Diagnosed with mycotic nail infection by Dr. Elisha Ponder .   Toenail most affected 1-3 right .  All other systems are negative.  Nails were filed thin. Laser therapy was administered to 1-5 toenails bilateral  and patient tolerated the treatment well. All safety precautions were in place.   Treated one pass on all unaffected nails.  Patient stated that she is going to start back using Jublia.   Follow up in 4 weeks for laser # 2.  Picture of nails taken today to document visual progress

## 2021-09-24 NOTE — Patient Instructions (Signed)

## 2021-09-28 NOTE — Progress Notes (Signed)
  Subjective:  Patient ID: Jennifer Franco, female    DOB: 03-31-1956,  MRN: [id number]  66 y.o. female presents ingrown toenail to the right great toe. Patient states pain is located in both corners of the toe. She is scheduled to have her first laser treatment this Friday.  New problem(s): None   PCP is Kelton Pillar, MD , and last visit was July 05, 2021  Allergies  Allergen Reactions   Etodolac Diarrhea   Topamax [Topiramate] Other (See Comments)    Brain flutters     Review of Systems: Negative except as noted in the HPI.   Objective:  Vascular Examination: Vascular status intact b/l with palpable pedal pulses. CFT immediate b/l. No edema. No pain with calf compression b/l. Skin temperature gradient WNL b/l. Pedal hair sparse.  Neurological Examination: Protective sensation intact 5/5 intact bilaterally with 10g monofilament b/l.  Dermatological Examination: Pedal integument with normal turgor, texture and tone BLE. Pedal skin with normal turgor, texture and tone b/l lower extremities. Toenails recently debrided. Incurvated nailplate right great toe.  Nail border hypertrophy absent. There is tenderness to palpation. Sign(s) of infection: no clinical signs of infection noted on examination today..  Musculoskeletal Examination: Muscle strength 5/5 to b/l LE. No pain, crepitus or joint limitation noted with ROM bilateral LE.  Radiographs: None  Last A1c:      Latest Ref Rng & Units 03/31/2021    2:10 AM  Hemoglobin A1C  Hemoglobin-A1c 4.8 - 5.6 % 5.8     Assessment:   1. Ingrown toenail without infection    Plan:  -Patient was evaluated and treated. All patient's and/or POA's questions/concerns answered on today's visit. -Patient to have her first laser treatment this Friday. -Offending nail border debrided and curretaged R hallux utilizing sterile nail nipper and currette. Border(s) cleansed with alcohol and TAO applied. Patient instructed to apply  Neosporin Cream  to right great toe once daily for 7 days. Call office if there are any concerns. -Patient/POA to call should there be question/concern in the interim.  Return in about 3 months (around 12/22/2021).  Marzetta Board, DPM

## 2021-10-29 ENCOUNTER — Ambulatory Visit (INDEPENDENT_AMBULATORY_CARE_PROVIDER_SITE_OTHER): Payer: Medicare PPO

## 2021-10-29 DIAGNOSIS — B351 Tinea unguium: Secondary | ICD-10-CM

## 2021-11-03 ENCOUNTER — Other Ambulatory Visit: Payer: Self-pay | Admitting: Obstetrics and Gynecology

## 2021-11-03 DIAGNOSIS — Z01411 Encounter for gynecological examination (general) (routine) with abnormal findings: Secondary | ICD-10-CM | POA: Diagnosis not present

## 2021-11-03 DIAGNOSIS — E2839 Other primary ovarian failure: Secondary | ICD-10-CM

## 2021-11-03 DIAGNOSIS — Z0142 Encounter for cervical smear to confirm findings of recent normal smear following initial abnormal smear: Secondary | ICD-10-CM | POA: Diagnosis not present

## 2021-11-03 DIAGNOSIS — Z01419 Encounter for gynecological examination (general) (routine) without abnormal findings: Secondary | ICD-10-CM | POA: Diagnosis not present

## 2021-11-03 DIAGNOSIS — D649 Anemia, unspecified: Secondary | ICD-10-CM | POA: Diagnosis not present

## 2021-11-03 DIAGNOSIS — I639 Cerebral infarction, unspecified: Secondary | ICD-10-CM | POA: Diagnosis not present

## 2021-11-03 DIAGNOSIS — Z6825 Body mass index (BMI) 25.0-25.9, adult: Secondary | ICD-10-CM | POA: Diagnosis not present

## 2021-11-03 DIAGNOSIS — R87610 Atypical squamous cells of undetermined significance on cytologic smear of cervix (ASC-US): Secondary | ICD-10-CM | POA: Diagnosis not present

## 2021-11-03 DIAGNOSIS — Z113 Encounter for screening for infections with a predominantly sexual mode of transmission: Secondary | ICD-10-CM | POA: Diagnosis not present

## 2021-11-03 DIAGNOSIS — Z124 Encounter for screening for malignant neoplasm of cervix: Secondary | ICD-10-CM | POA: Diagnosis not present

## 2021-11-22 DIAGNOSIS — E039 Hypothyroidism, unspecified: Secondary | ICD-10-CM | POA: Diagnosis not present

## 2021-11-22 DIAGNOSIS — M1990 Unspecified osteoarthritis, unspecified site: Secondary | ICD-10-CM | POA: Diagnosis not present

## 2021-11-22 DIAGNOSIS — E785 Hyperlipidemia, unspecified: Secondary | ICD-10-CM | POA: Diagnosis not present

## 2021-11-22 DIAGNOSIS — F324 Major depressive disorder, single episode, in partial remission: Secondary | ICD-10-CM | POA: Diagnosis not present

## 2021-11-22 DIAGNOSIS — Z7969 Long term (current) use of other immunomodulators and immunosuppressants: Secondary | ICD-10-CM | POA: Diagnosis not present

## 2021-11-22 DIAGNOSIS — R32 Unspecified urinary incontinence: Secondary | ICD-10-CM | POA: Diagnosis not present

## 2021-11-22 DIAGNOSIS — Z8249 Family history of ischemic heart disease and other diseases of the circulatory system: Secondary | ICD-10-CM | POA: Diagnosis not present

## 2021-11-22 DIAGNOSIS — M069 Rheumatoid arthritis, unspecified: Secondary | ICD-10-CM | POA: Diagnosis not present

## 2021-11-22 DIAGNOSIS — G3184 Mild cognitive impairment, so stated: Secondary | ICD-10-CM | POA: Diagnosis not present

## 2021-12-03 ENCOUNTER — Ambulatory Visit (INDEPENDENT_AMBULATORY_CARE_PROVIDER_SITE_OTHER): Payer: Medicare PPO | Admitting: Podiatry

## 2021-12-03 DIAGNOSIS — B351 Tinea unguium: Secondary | ICD-10-CM

## 2021-12-03 NOTE — Progress Notes (Signed)
Patient presents today for the 3rd laser treatment. Diagnosed with mycotic nail infection by Dr. Elisha Ponder .    Toenail most affected 1-3 right .   All other systems are negative.   Nails were cut and filed thin. Laser therapy was administered to 1-3 toenails on right foot and patient tolerated the treatment well. All safety precautions were in place.      Follow up in 6 weeks for laser # 4.

## 2021-12-09 NOTE — Progress Notes (Deleted)
[facility] [address] [place] [address] [phone]       OFFICE CONSULT NOTE  [name] Date of Birth:  [birth year] Medical Record Number:  [id number]   Referring MD: Emelda Brothers  Reason for Referral: Memory loss versus transient global amnesia  HPI:   Update 12/09/2021 JM: Patient referred back to office by PCP per patient request due to memory loss concerns.    Seen by PCP 7/14 and reported had difficulty remembering events that occurred the past 3 days but overall noticed memory changes over the past couple weeks.  She continues to care for her mother which is stressful.  She does have underlying depression currently on Lexapro 10 mg daily.   Previously seen by Dr. Leonie Man approximately 4 months ago for similar complaints that occurred in 03/2021 and 07/2021 possibly in setting of TGA but underlying significant stress may have also contributed.  Extensive work-up in ED both times which was largely unremarkable except low B12 level at 135 and started on supplement.          History provided for reference purposes only Consult visit 07/29/2021 Dr. Leonie Man: Jennifer Franco is a 66 year old pleasant lady seen today for initial office consultation visit for memory loss episode.  She has past medical history of hypothyroidism, rheumatoid arthritis, morbid obesity, Chardon syndrome, reflux esophagitis who presented on 03/30/2021 to [place] emergency room with disorientation and confusion and memory difficulties.  She repeatedly asked same question over and over again symptoms are concerning for transient global amnesia and CT scan of the head was done which was negative for acute abnormality.  She was transferred to [facility] for further evaluation.  Upon speaking with the daughter and obtaining more detailed history patient has a history of baseline depression.  She actually had very poor recollection of events for the last  several days and actually went up 2 weeks of Thanksgiving.  She did not remember that they had a Thanksgiving meal and events of that day.  She did endorse significant stress particularly due to concern about taking care of her mother with advanced dementia.  When patient was evaluated by me and her daughter at the bedside she actually stated she had no recollection for events going back up to 1 week and even longer.  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  With 3 word recall was 1/3 and she struggled to name animals.  MRI scan of the brain showed no acute abnormality.  There was questionable punctate hyperintensity in the right hippocampus but not a definite finding.  MR angiogram showed no acute abnormality in the brain carotid ultrasound showed no significant extracranial stenosis.  LDL cholesterol 128 mg percent.  Hemoglobin A1c was 5.8.  Vitamin B12 levels were low at 135 and she was started on replacement.  Patient was seen by psychiatry and felt underlying stress and depression could have contributed.  Patient was discharged home and was doing better but returned back to the ER on 07/17/2021 for confusion and did not remember events from the day before.  She did have a lot of vomiting on that day.  ER physician felt her symptoms were psychosomatic due to severe stress CT scan of the head was obtained which showed no acute abnormality.  Patient feels this episode was different and she was clearly aware of what was happening and was just not feeling well and was nauseous.  She could remember events of the hospitalization..  On cognitive evaluation today in the office she scored 0/3 on recall but was able to name 12 animals which can walk on 4 legs and clock drawing was 4/4.  On Mini-Mental status exam she scored 29/30.  On geriatric depression scale she scored 7 suggestive of mild depression.  ROS:   14 system review of systems is positive for memory  difficulties, feeling overwhelmed, stress, depression, nausea all other systems negative  PMH:  Past Medical History:  Diagnosis Date   Anemia    Anginal pain (HCC)    anxiety   Anxiety    Cancer (HCC)    bladder   Chills    CTS (carpal tunnel syndrome)    LEFT   Cyclical vomiting    Depression    Diarrhea, unspecified    Diverticulitis    Fibromyalgia    Hemorrhoids    History of adenomatous polyp of colon    History of morbid obesity    Hypothyroid    Hypothyroidism    Intractable nausea and vomiting    Irregular bowel habits    Joint pain    LLQ abdominal pain    Morbid obesity (HCC)    Nausea and vomiting    Onychomycosis    RA (rheumatoid arthritis) (HCC)    Reflux esophagitis    Reflux esophagitis    Sjogren's syndrome (HCC)    Tinea corporis    Unspecified mood (affective) disorder (HCC)    Weight loss     Social History:  Social History   Socioeconomic History   Marital status: Divorced    Spouse name: Not on file   Number of children: Not on file   Years of education: Not on file   Highest education level: Not on file  Occupational History   Not on file  Tobacco Use   Smoking status: Former   Smokeless tobacco: Never  Vaping Use   Vaping Use: Never used  Substance and Sexual Activity   Alcohol use: Yes    Comment: rare   Drug use: No   Sexual activity: Not on file  Other Topics Concern   Not on file  Social History Narrative   Not on file   Social Determinants of Health   Financial Resource Strain: Not on file  Food Insecurity: Not on file  Transportation Needs: Not on file  Physical Activity: Not on file  Stress: Not on file  Social Connections: Not on file  Intimate Partner Violence: Not on file    Medications:   Current Outpatient Medications on File Prior to Visit  Medication Sig Dispense Refill   acetaminophen (TYLENOL) 500 MG tablet Take 1,000 mg by mouth every 6 (six) hours as needed for mild pain.     acetaminophen  (TYLENOL) 500 MG tablet 2 tablets as needed     acetaminophen (TYLENOL) 500 MG tablet      atorvastatin (LIPITOR) 20 MG tablet Take 1 tablet (20 mg total) by mouth daily. 30 tablet 2   atorvastatin (LIPITOR) 20 MG tablet 1 tablet     atorvastatin (LIPITOR) 20 MG tablet      Calcium Carb-Cholecalciferol (CALCIUM 600 + D) 600-5 MG-MCG TABS 1 tablet with a meal     Calcium-Vitamin D 600-200 MG-UNIT tablet Take 1 tablet by mouth daily.     Cholecalciferol (VITAMIN D3 PO) Take 1 tablet by mouth daily.     cholecalciferol (VITAMIN D3) 25 MCG (1000 UNIT) tablet  Efinaconazole 10 % SOLN Apply 8 mLs topically in the morning and at bedtime. 8 mL 3   escitalopram (LEXAPRO) 10 MG tablet 1 tablet     ferrous sulfate 325 (65 FE) MG tablet Take 325 mg by mouth daily with breakfast.     folic acid (FOLVITE) 1 MG tablet 1 tablet     hydroxychloroquine (PLAQUENIL) 200 MG tablet      ibuprofen (ADVIL) 200 MG tablet Take 400 mg by mouth every 8 (eight) hours as needed for moderate pain.      ibuprofen (ADVIL) 200 MG tablet 2 tablets with food or milk as needed     Levothyroxine Sodium 125 MCG CAPS      Multiple Vitamin (MULTIVITAMIN) tablet Take 1 tablet by mouth daily.     olopatadine (PATANOL) 0.1 % ophthalmic solution 1 drop into affected eye     olopatadine (PATANOL) 0.1 % ophthalmic solution      ondansetron (ZOFRAN ODT) 4 MG disintegrating tablet Take 1 tablet (4 mg total) by mouth every 8 (eight) hours as needed for nausea or vomiting. 4 tablet 0   ondansetron (ZOFRAN) 4 MG tablet 1-2 tablets     Potassium 99 MG TABS 1 tablet Orally Once a day     No current facility-administered medications on file prior to visit.    Allergies:   Allergies  Allergen Reactions   Etodolac Diarrhea   Topamax [Topiramate] Other (See Comments)    Brain flutters     Physical Exam There were no vitals filed for this visit. There is no height or weight on file to calculate BMI.   General: well developed, well  nourished middle-aged African-American lady, seated, in no evident distress Head: head normocephalic and atraumatic.   Neck: supple with no carotid or supraclavicular bruits Cardiovascular: regular rate and rhythm, no murmurs Musculoskeletal: no deformity Skin:  no rash/petichiae Vascular:  Normal pulses all extremities  Neurologic Exam Mental Status: Awake and fully alert. Oriented to place and time. Recent and remote memory intact. Attention span, concentration and fund of knowledge appropriate. Mood and affect appropriate.  Mini-Mental status exam scored 29/30 with only 1 deficit in recall.  Geriatric depression scale score 7 for history of only mild depression.  Able to name 12 animals which can walk on 4 legs.  Clock drawing 4/4.    07/29/2021    9:53 AM  MMSE - Mini Mental State Exam  Orientation to time 5  Orientation to Place 5  Registration 3  Attention/ Calculation 5  Recall 2  Language- name 2 objects 2  Language- repeat 1  Language- follow 3 step command 3  Language- read & follow direction 1  Write a sentence 1  Copy design 1  Total score 29    Cranial Nerves: Fundoscopic exam reveals sharp disc margins. Pupils equal, briskly reactive to light. Extraocular movements full without nystagmus. Visual fields full to confrontation. Hearing intact. Facial sensation intact. Face, tongue, palate moves normally and symmetrically.  Motor: Normal bulk and tone. Normal strength in all tested extremity muscles. Sensory.: intact to touch , pinprick , position and vibratory sensation.  Coordination: Rapid alternating movements normal in all extremities. Finger-to-nose and heel-to-shin performed accurately bilaterally. Gait and Station: Arises from chair without difficulty. Stance is normal. Gait demonstrates normal stride length and balance . Able to heel, toe and tandem walk with difficulty.  Reflexes: 1+ and symmetric. Toes downgoing.       ASSESSMENT/PLAN: 66 year old  African-American lady who was referred back  to office per patient request with concerns of 2-week worsening memory loss and 7/22.  Previously seen by Dr. Leonie Man 3/23 with similar concerns that occurred in 03/2021 with unclear etiology possibly transient global amnesia but underlying significant stress may have contributed and was advised to follow-up as needed      PLAN:  I had a long discussion with the patient regarding her symptoms of memory loss episode which is of unclear etiology.  Transient global amnesia is a possibility but the duration of the memory loss episode is quite prolonged and lack of improvement over time also argues against it.  He underwent in March I advised the patient also to increase participation in regular activities for stress relaxation like meditation and yoga and is significant underlying stress may have something to do with this episode.  She likely has mild cognitive impairment which may have a contribution from suboptimally treated depression as well.  I recommend she increase participation in cognitively challenging activities like solving crossword puzzles, playing bridge and sudoku.  We also discussed memory compensation strategies.  No further neurological work-up is necessary.  Follow-up with a psychiatrist for optimal treatment of depression.  She may return for follow-up in the future only as needed.      CC:  [facility] provider: Kelton Pillar, MD   I spent *** minutes of face-to-face and non-face-to-face time with patient.  This included previsit chart review, lab review, study review, order entry, electronic health record documentation, patient education  Frann Rider, [facility]  [facility] [address] [place], [place] [address]  Phone [phone] Fax [phone] Note: This document was prepared with digital dictation and possible smart phrase technology. Any transcriptional errors that result from this process are  unintentional.

## 2021-12-13 ENCOUNTER — Ambulatory Visit: Payer: Medicare PPO | Admitting: Adult Health

## 2021-12-13 ENCOUNTER — Encounter: Payer: Self-pay | Admitting: Adult Health

## 2021-12-13 NOTE — Progress Notes (Unsigned)
[facility] [address] [place]. [place] [address] [phone]       OFFICE FOLLOW UP NOTE  Jennifer Franco Date of Birth:  [birth year] Medical Record Number:  [id number]   Referring MD: Jennifer Franco  Reason for Referral: Memory loss   Chief Complaint  Patient presents with   Follow-up    RM 3 alone  Pt is well, states she is still having trouble with long term memory.      HPI:   Update 12/14/2021 JM: Patient returns per patient request due to memory loss concerns.   She reports continued difficulty with both short-term and long-term memory loss.  Reports episode back in July where she had difficulty remembering the past 3 days similar to episode back in March.  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  She also endorses some family stressors which have been contributing.  She does admit to underlying depression/anxiety currently on Lexapro but denies much benefit. She is frustrated that she could not remember previously seeing Dr. Leonie Franco 4 months ago (although remember the office) and had to use GPS to get to the office (although only been seen here 1x). She has been trying to work on weight loss and in fact has lost about 20 pounds since prior visit.  She continues to maintain ADLs and IADLs as well as driving without difficulty.  No further concerns today.      History provided for reference purposes only Consult visit 07/29/2021 Dr. Leonie Franco: [name] is a 66 year old pleasant lady seen today for initial office consultation visit for memory loss episode.  She has past medical history of hypothyroidism, rheumatoid arthritis, morbid obesity, Chardon syndrome, reflux esophagitis who presented on 03/30/2021 to [facility] emergency room with disorientation and confusion and memory difficulties.  She repeatedly asked same question over and over again symptoms are concerning for  transient global amnesia and CT scan of the head was done which was negative for acute abnormality.  She was transferred to [facility] for further evaluation.  Upon speaking with the daughter and obtaining more detailed history patient has a history of baseline depression.  She actually had very poor recollection of events for the last several days and actually went up 2 weeks of Thanksgiving.  She did not remember that they had a Thanksgiving meal and events of that day.  She did endorse significant stress particularly due to concern about taking care of her mother with advanced dementia.  When patient was evaluated by me and her daughter at the bedside she actually stated she had no recollection for events going back up to 1 week and even longer.  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  With 3 word recall was 1/3 and she struggled to name animals.  MRI scan of the brain showed no acute abnormality.  There was questionable punctate hyperintensity in the right hippocampus but not a definite finding.  MR angiogram showed no acute abnormality in the brain carotid ultrasound showed no significant extracranial stenosis.  LDL cholesterol 128 mg percent.  Hemoglobin A1c was 5.8.  Vitamin B12 levels were low at 135 and she was started on replacement.  Patient was seen by psychiatry and felt underlying stress and depression could have contributed.  Patient was discharged home and was doing better but returned back to the ER on 07/17/2021 for confusion and did not  remember events from the day before.  She did have a lot of vomiting on that day.  ER physician felt her symptoms were psychosomatic due to severe stress CT scan of the head was obtained which showed no acute abnormality.  Patient feels this episode was different and she was clearly aware of what was happening and was just not feeling well and was nauseous.  She could remember events of the  hospitalization..  On cognitive evaluation today in the office she scored 0/3 on recall but was able to name 12 animals which can walk on 4 legs and clock drawing was 4/4.  On Mini-Mental status exam she scored 29/30.  On geriatric depression scale she scored 7 suggestive of mild depression.     ROS:   14 system review of systems is positive for those listed in HPI and all other systems negative  PMH:  Past Medical History:  Diagnosis Date   Anemia    Anginal pain (HCC)    anxiety   Anxiety    Cancer (HCC)    bladder   Chills    CTS (carpal tunnel syndrome)    LEFT   Cyclical vomiting    Depression    Diarrhea, unspecified    Diverticulitis    Fibromyalgia    Hemorrhoids    History of adenomatous polyp of colon    History of morbid obesity    Hypothyroid    Hypothyroidism    Intractable nausea and vomiting    Irregular bowel habits    Joint pain    LLQ abdominal pain    Morbid obesity (HCC)    Nausea and vomiting    Onychomycosis    RA (rheumatoid arthritis) (HCC)    Reflux esophagitis    Reflux esophagitis    Sjogren's syndrome (HCC)    Tinea corporis    Unspecified mood (affective) disorder (HCC)    Weight loss     Social History:  Social History   Socioeconomic History   Marital status: Divorced    Spouse name: Not on file   Number of children: Not on file   Years of education: Not on file   Highest education level: Not on file  Occupational History   Not on file  Tobacco Use   Smoking status: Former   Smokeless tobacco: Never  Vaping Use   Vaping Use: Never used  Substance and Sexual Activity   Alcohol use: Yes    Comment: rare   Drug use: No   Sexual activity: Not on file  Other Topics Concern   Not on file  Social History Narrative   Not on file   Social Determinants of Health   Financial Resource Strain: Not on file  Food Insecurity: Not on file  Transportation Needs: Not on file  Physical Activity: Not on file  Stress: Not on file   Social Connections: Not on file  Intimate Partner Violence: Not on file    Medications:   Current Outpatient Medications on File Prior to Visit  Medication Sig Dispense Refill   acetaminophen (TYLENOL) 500 MG tablet      atorvastatin (LIPITOR) 20 MG tablet      Calcium Carb-Cholecalciferol (CALCIUM 600 + D) 600-5 MG-MCG TABS 1 tablet with a meal     cholecalciferol (VITAMIN D3) 25 MCG (1000 UNIT) tablet      ferrous sulfate 325 (65 FE) MG tablet Take 325 mg by mouth daily with breakfast.     folic acid (FOLVITE) 1 MG  tablet 1 tablet     hydroxychloroquine (PLAQUENIL) 200 MG tablet      Levothyroxine Sodium 125 MCG CAPS      Multiple Vitamin (MULTIVITAMIN) tablet Take 1 tablet by mouth daily.     olopatadine (PATANOL) 0.1 % ophthalmic solution      ondansetron (ZOFRAN ODT) 4 MG disintegrating tablet Take 1 tablet (4 mg total) by mouth every 8 (eight) hours as needed for nausea or vomiting. 4 tablet 0   atorvastatin (LIPITOR) 20 MG tablet Take 1 tablet (20 mg total) by mouth daily. 30 tablet 2   No current facility-administered medications on file prior to visit.    Allergies:   Allergies  Allergen Reactions   Etodolac Diarrhea   Topamax [Topiramate] Other (See Comments)    Brain flutters     Physical Exam Today's Vitals   12/14/21 1353  BP: 115/73  Pulse: 61  Weight: 135 lb (61.2 kg)  Height: '5\' 4"'$  (1.626 m)   Body mass index is 23.17 kg/m.   General: well developed, well nourished very pleasant middle-aged African-American lady, seated, in no evident distress Head: head normocephalic and atraumatic.   Neck: supple with no carotid or supraclavicular bruits Cardiovascular: regular rate and rhythm, no murmurs Musculoskeletal: no deformity Skin:  no rash/petichiae Vascular:  Normal pulses all extremities  Neurologic Exam Mental Status: Awake and fully alert. Oriented to place and time. Recent and remote memory subjectively impaired. Attention span, concentration and  fund of knowledge appropriate. Mood and affect appropriate.      12/14/2021    1:55 PM 07/29/2021    9:53 AM  MMSE - Mini Mental State Exam  Orientation to time 5 5  Orientation to Place 5 5  Registration 3 3  Attention/ Calculation 1 5  Recall 3 2  Language- name 2 objects 2 2  Language- repeat 1 1  Language- follow 3 step command 3 3  Language- read & follow direction 1 1  Write a sentence 1 1  Copy design 1 1  Total score 26 29   Cranial Nerves: Pupils equal, briskly reactive to light. Extraocular movements full without nystagmus. Visual fields full to confrontation. Hearing intact. Facial sensation intact. Face, tongue, palate moves normally and symmetrically.  Motor: Normal bulk and tone. Normal strength in all tested extremity muscles. Sensory.: intact to touch , pinprick , position and vibratory sensation.  Coordination: Rapid alternating movements normal in all extremities. Finger-to-nose and heel-to-shin performed accurately bilaterally. Gait and Station: Arises from chair without difficulty. Stance is normal. Gait demonstrates normal stride length and balance . Able to heel, toe and tandem walk with difficulty.  Reflexes: 1+ and symmetric. Toes downgoing.         ASSESSMENT/PLAN: 66 year old African-American lady with recurrent memory loss last month similar to prior episodes in 03/2021 and 07/2021 and concerns of continued memory difficulties. Unclear etiology possibly transient global amnesia (although duration of episodes quite prolonged and lack of improvement over time argues against this) but underlying significant stress may have contributed    -As prior extensive workup with transient episodes of memory loss, will hold off on further evaluation at this time as neuro exam stable without any new or concerning findings -She has significant amount of stressors and suspect largely contributing.  She is greatly concerned of developing dementia as she is currently caring  for her mother who has dementia. Will refer to neuropsychology for neurocognitive evaluation for further evaluation -will repeat B12, folate and thyroid panel today -will switch  Lexapro to Zoloft 25 mg nightly - has f/u with PCP in 3 weeks and will further evaluation and possible need of adjustment at that time -Highly recommend establishing care with behavioral health -increase participation in cognitively challenging activities like solving crossword puzzles, playing bridge and sudoku.  We also discussed memory compensation strategies.       CC:  Collene Leyden, MD   I spent 36 minutes of face-to-face and non-face-to-face time with patient.  This included previsit chart review, lab review, study review, order entry, electronic health record documentation, patient education and discussion regarding transient memory loss and continued memory loss concerns, review and discussion of MMSE, further evaluation and answered all other questions to patient satisfaction  Frann Rider, [facility]  [facility] [address] [place], [place] [address]  Phone [phone] Fax [phone] Note: This document was prepared with digital dictation and possible smart phrase technology. Any transcriptional errors that result from this process are unintentional.

## 2021-12-14 ENCOUNTER — Ambulatory Visit: Payer: Medicare PPO | Admitting: Adult Health

## 2021-12-14 ENCOUNTER — Encounter: Payer: Self-pay | Admitting: Adult Health

## 2021-12-14 VITALS — BP 115/73 | HR 61 | Ht 64.0 in | Wt 135.0 lb

## 2021-12-14 DIAGNOSIS — E538 Deficiency of other specified B group vitamins: Secondary | ICD-10-CM | POA: Diagnosis not present

## 2021-12-14 DIAGNOSIS — F418 Other specified anxiety disorders: Secondary | ICD-10-CM | POA: Diagnosis not present

## 2021-12-14 DIAGNOSIS — E039 Hypothyroidism, unspecified: Secondary | ICD-10-CM

## 2021-12-14 DIAGNOSIS — R413 Other amnesia: Secondary | ICD-10-CM

## 2021-12-14 MED ORDER — SERTRALINE HCL 25 MG PO TABS: 25.0000 mg | ORAL_TABLET | Freq: Every day | ORAL | 5 refills | Status: AC

## 2021-12-14 MED ORDER — SERTRALINE HCL 25 MG PO TABS: 25.0000 mg | ORAL_TABLET | Freq: Every day | ORAL | 5 refills | Status: DC

## 2021-12-14 NOTE — Patient Instructions (Addendum)
Your Plan:  We will check lab today to look for reversible causes  Recommend changing Lexapro to Zoloft - can start Zoloft tonight - please follow up with PCP as scheduled to discuss medications, consider evaluation with behavioral health   You will be called to schedule neurocognitive evaluation with neuropsychology        Thank you for coming to see [place] at [facility]. I hope we have been able to provide you high quality care today.  You may receive a patient satisfaction survey over the next few weeks. We would appreciate your feedback and comments so that we may continue to improve ourselves and the health of our patients.      Sertraline Tablets What is this medication? SERTRALINE (SER tra leen) treats depression, anxiety, obsessive-compulsive disorder (OCD), post-traumatic stress disorder (PTSD), and premenstrual dysphoric disorder (PMDD). It increases the amount of serotonin in the brain, a hormone that helps regulate mood. It belongs to a group of medications called SSRIs. This medicine may be used for other purposes; ask your health care provider or pharmacist if you have questions. COMMON BRAND NAME(S): Zoloft What should I tell my care team before I take this medication? They need to know if you have any of these conditions: Bleeding disorders Bipolar disorder or a family history of bipolar disorder Frequently drink alcohol Glaucoma Heart disease High blood pressure History of irregular heartbeat History of low levels of calcium, magnesium, or potassium in the blood Liver disease Receiving electroconvulsive therapy Seizures Suicidal thoughts, plans, or attempt; a previous suicide attempt by you or a family member Take medications that prevent or treat blood clots Thyroid disease An unusual or allergic reaction to sertraline, other medications, foods, dyes, or preservatives Pregnant or trying to get pregnant Breast-feeding How should I use this  medication? Take this medication by mouth with a glass of water. Follow the directions on the prescription label. You can take it with or without food. Take your medication at regular intervals. Do not take your medication more often than directed. Do not stop taking this medication suddenly except upon the advice of your care team. Stopping this medication too quickly may cause serious side effects or your condition may worsen. A special MedGuide will be given to you by the pharmacist with each prescription and refill. Be sure to read this information carefully each time. Talk to your care team about the use of this medication in children. While this medication may be prescribed for children as young as 7 years for selected conditions, precautions do apply. Overdosage: If you think you have taken too much of this medicine contact a poison control center or emergency room at once. NOTE: This medicine is only for you. Do not share this medicine with others. What if I miss a dose? If you miss a dose, take it as soon as you can. If it is almost time for your next dose, take only that dose. Do not take double or extra doses. What may interact with this medication? Do not take this medication with any of the following: Cisapride Dronedarone Linezolid MAOIs like Carbex, Eldepryl, Marplan, Nardil, and Parnate Methylene blue (injected into a vein) Pimozide Thioridazine This medication may also interact with the following: Alcohol Amphetamines Aspirin and aspirin-like medications Certain medications for depression, anxiety, or other mental health conditions Certain medications for fungal infections like ketoconazole, fluconazole, posaconazole, and itraconazole Certain medications for irregular heart beat like flecainide, quinidine, propafenone Certain medications for migraine headaches like almotriptan, eletriptan,  frovatriptan, naratriptan, rizatriptan, sumatriptan, zolmitriptan Certain medications  for sleep Certain medications for seizures like carbamazepine, valproic acid, phenytoin Certain medications that treat or prevent blood clots like warfarin, enoxaparin, dalteparin Cimetidine Digoxin Diuretics Fentanyl Isoniazid Lithium NSAIDs, medications for pain and inflammation, like ibuprofen or naproxen Other medications that prolong the QT interval (cause an abnormal heart rhythm) like dofetilide Rasagiline Safinamide Supplements like St. John's wort, kava kava, valerian Tolbutamide Tramadol Tryptophan This list may not describe all possible interactions. Give your health care provider a list of all the medicines, herbs, non-prescription drugs, or dietary supplements you use. Also tell them if you smoke, drink alcohol, or use illegal drugs. Some items may interact with your medicine. What should I watch for while using this medication? Tell your care team if your symptoms do not get better or if they get worse. Visit your care team for regular checks on your progress. Because it may take several weeks to see the full effects of this medication, it is important to continue your treatment as prescribed by your care team. Patients and their families should watch out for new or worsening thoughts of suicide or depression. Also watch out for sudden changes in feelings such as feeling anxious, agitated, panicky, irritable, hostile, aggressive, impulsive, severely restless, overly excited and hyperactive, or not being able to sleep. If this happens, especially at the beginning of treatment or after a change in dose, call your care team. This medication may affect your coordination, reaction time, or judgment. Do not drive or operate machinery until you know how this medication affects you. Sit or stand up slowly to reduce the risk of dizzy or fainting spells. Drinking alcohol with this medication can increase the risk of these side effects. Your mouth may get dry. Chewing sugarless gum or  sucking hard candy, and drinking plenty of water may help. Contact your care team if the problem does not go away or is severe. What side effects may I notice from receiving this medication? Side effects that you should report to your care team as soon as possible: Allergic reactions--skin rash, itching, hives, swelling of the face, lips, tongue, or throat Bleeding--bloody or black, tar-like stools, red or dark brown urine, vomiting blood or brown material that looks like coffee grounds, small red or purple spots on skin, unusual bleeding or bruising Heart rhythm changes--fast or irregular heartbeat, dizziness, feeling faint or lightheaded, chest pain, trouble breathing Low sodium level--muscle weakness, fatigue, dizziness, headache, confusion Serotonin syndrome--irritability, confusion, fast or irregular heartbeat, muscle stiffness, twitching muscles, sweating, high fever, seizure, chills, vomiting, diarrhea Sudden eye pain or change in vision such as blurred vision, seeing halos around lights, vision loss Thoughts of suicide or self-harm, worsening mood Side effects that usually do not require medical attention (report these to your care team if they continue or are bothersome): Change in sex drive or performance Diarrhea Excessive sweating Nausea Tremors or shaking Upset stomach This list may not describe all possible side effects. Call your doctor for medical advice about side effects. You may report side effects to FDA at 1-800-FDA-1088. Where should I keep my medication? Keep out of the reach of children and pets. Store at room temperature between 15 and 30 degrees C (59 and 86 degrees F). Get rid of any unused medication after the expiration date. To get rid of medications that are no longer needed or expired: Take the medication to a medication take-back program. Check with your pharmacy or law enforcement to find a location. If  you cannot return the medication, check the label or  package insert to see if the medication should be thrown out in the garbage or flushed down the toilet. If you are not sure, ask your care team. If it is safe to put in the trash, empty the medication out of the container. Mix the medication with cat litter, dirt, coffee grounds, or other unwanted substance. Seal the mixture in a bag or container. Put it in the trash. NOTE: This sheet is a summary. It may not cover all possible information. If you have questions about this medicine, talk to your doctor, pharmacist, or health care provider.  2023 Elsevier/Gold Standard (2007-06-16 00:00:00)

## 2021-12-20 ENCOUNTER — Telehealth: Payer: Self-pay | Admitting: Adult Health

## 2021-12-20 NOTE — Telephone Encounter (Signed)
Referral sent to [facility] [phone]. ?

## 2021-12-21 ENCOUNTER — Telehealth: Payer: Self-pay | Admitting: *Deleted

## 2021-12-21 LAB — B12 AND FOLATE PANEL
Folate: >20 ng/mL (ref >3.0)
Vitamin B-12: 1470 pg/mL — ABNORMAL HIGH (ref 232–1245)

## 2021-12-21 LAB — THYROID PANEL WITH TSH
Free Thyroxine Index: 2.4 (ref 1.2–4.9)
T3 Uptake Ratio: 32 % (ref 24–39)
T4, Total: 7.5 ug/dL (ref 4.5–12.0)
TSH: 0.461 u[IU]/mL (ref 0.450–4.500)

## 2021-12-21 NOTE — Telephone Encounter (Signed)
Call patient advised her that her recent lab work looked good.  B12 level slightly on higher side, please advise her to follow-up with her PCP - unsure if she is taking B12 supplement but folic acid is on list, she may be able to decrease this amount but again would advise her to further discuss with PCP. She takes B12 1000 mcg daily. I advised she may cut back to every other day but discuss with PCP. Answered questions to her stated satisfaction. Patient verbalized understanding, appreciation.

## 2021-12-23 DIAGNOSIS — H43813 Vitreous degeneration, bilateral: Secondary | ICD-10-CM | POA: Diagnosis not present

## 2021-12-23 DIAGNOSIS — Z79899 Other long term (current) drug therapy: Secondary | ICD-10-CM | POA: Diagnosis not present

## 2021-12-23 DIAGNOSIS — H04123 Dry eye syndrome of bilateral lacrimal glands: Secondary | ICD-10-CM | POA: Diagnosis not present

## 2021-12-23 DIAGNOSIS — M3501 Sicca syndrome with keratoconjunctivitis: Secondary | ICD-10-CM | POA: Diagnosis not present

## 2021-12-28 ENCOUNTER — Ambulatory Visit: Payer: Medicare PPO | Admitting: Podiatry

## 2021-12-31 ENCOUNTER — Ambulatory Visit (INDEPENDENT_AMBULATORY_CARE_PROVIDER_SITE_OTHER): Payer: Medicare PPO

## 2021-12-31 DIAGNOSIS — B351 Tinea unguium: Secondary | ICD-10-CM

## 2021-12-31 NOTE — Progress Notes (Signed)
Patient presents today for the 4th laser treatment. Diagnosed with mycotic nail infection by Dr. Elisha Ponder .    Toenail most affected 1-3 right .   All other systems are negative.   Nails were cut and filed thin. Laser therapy was administered to 1-3 toenails on right foot and patient tolerated the treatment well. All safety precautions were in place.      Follow up in 6 weeks for laser # 5.

## 2022-01-04 DIAGNOSIS — F329 Major depressive disorder, single episode, unspecified: Secondary | ICD-10-CM | POA: Diagnosis not present

## 2022-01-04 DIAGNOSIS — E785 Hyperlipidemia, unspecified: Secondary | ICD-10-CM | POA: Diagnosis not present

## 2022-01-04 DIAGNOSIS — E039 Hypothyroidism, unspecified: Secondary | ICD-10-CM | POA: Diagnosis not present

## 2022-01-04 DIAGNOSIS — R748 Abnormal levels of other serum enzymes: Secondary | ICD-10-CM | POA: Diagnosis not present

## 2022-01-04 DIAGNOSIS — M069 Rheumatoid arthritis, unspecified: Secondary | ICD-10-CM | POA: Diagnosis not present

## 2022-01-04 DIAGNOSIS — B351 Tinea unguium: Secondary | ICD-10-CM | POA: Diagnosis not present

## 2022-01-27 DIAGNOSIS — R32 Unspecified urinary incontinence: Secondary | ICD-10-CM | POA: Diagnosis not present

## 2022-01-28 DIAGNOSIS — M160 Bilateral primary osteoarthritis of hip: Secondary | ICD-10-CM | POA: Diagnosis not present

## 2022-01-31 DIAGNOSIS — M25552 Pain in left hip: Secondary | ICD-10-CM | POA: Diagnosis not present

## 2022-01-31 NOTE — H&P (Signed)
HIP ARTHROPLASTY ADMISSION H&P  Patient ID: CINDIA HUSTEAD MRN: [id number] DOB/AGE: May 15, 1955 66 y.o.  Chief Complaint: left hip pain.  Planned Procedure Date: 02/15/22 Medical Clearance by Dr. Laurann Montana   Additional clearance by Dr. Leonie Man (neuro), Dr. Evelena Asa   HPI: SHANEESE TAIT is a 66 y.o. female who presents for evaluation of djd left hip. The patient has a history of pain and functional disability in the left hip due to arthritis and has failed non-surgical conservative treatments for greater than 12 weeks to include NSAID's and/or analgesics, corticosteriod injections, weight reduction as appropriate, and activity modification.  Onset of symptoms was gradual, starting 3 years ago with gradually worsening course since that time. The patient noted no past surgery on the left hip.  Patient currently rates pain at 8 out of 10 with activity. Patient has night pain, worsening of pain with activity and weight bearing, and pain that interferes with activities of daily living.  Patient has evidence of joint space narrowing by imaging studies.  There is no active infection.  Past Medical History:  Diagnosis Date   Anemia    Anginal pain (HCC)    anxiety   Anxiety    Cancer (HCC)    bladder   Chills    CTS (carpal tunnel syndrome)    LEFT   Cyclical vomiting    Depression    Diarrhea, unspecified    Diverticulitis    Fibromyalgia    Hemorrhoids    History of adenomatous polyp of colon    History of morbid obesity    Hypothyroid    Hypothyroidism    Intractable nausea and vomiting    Irregular bowel habits    Joint pain    LLQ abdominal pain    Morbid obesity (HCC)    Nausea and vomiting    Onychomycosis    RA (rheumatoid arthritis) (HCC)    Reflux esophagitis    Reflux esophagitis    Sjogren's syndrome (HCC)    Tinea corporis    Unspecified mood (affective) disorder (HCC)    Weight loss    Past Surgical History:  Procedure Laterality Date    ADENOIDECTOMY     CARPAL TUNNEL RELEASE Right    CESAREAN SECTION     CESAREAN SECTION     CYSTOSCOPY WITH STENT PLACEMENT N/A 08/01/2019   Procedure: CYSTOSCOPY WITH URETHRAL DILATION, BILATERAL RETROGRADE PYELOGRAMS, TRANSURETHRAL RESECTION OF SMALL BLADDER TUMOR, INSERTION OF BILATERAL OPEN ENDED CATHETERS WITH BILATERAL INJECTION OF FIREFLY;  Surgeon: Irine Seal, MD;  Location: [facility];  Service: Urology;  Laterality: N/A;   FLEXIBLE SIGMOIDOSCOPY N/A 08/01/2019   Procedure: FLEXIBLE SIGMOIDOSCOPY;  Surgeon: Ileana Roup, MD;  Location: [facility];  Service: General;  Laterality: N/A;   LAPAROSCOPIC APPENDECTOMY N/A 01/28/2021   Procedure: APPENDECTOMY LAPAROSCOPIC;  Surgeon: Ileana Roup, MD;  Location: [facility];  Service: General;  Laterality: N/A;   wisdom teeth     Allergies  Allergen Reactions   Etodolac Diarrhea   Topamax [Topiramate] Other (See Comments)    Brain flutters    Prior to Admission medications   Medication Sig Start Date End Date Taking? Authorizing Provider  acetaminophen (TYLENOL) 500 MG tablet     [provider]  atorvastatin (LIPITOR) 20 MG tablet Take 1 tablet (20 mg total) by mouth daily. 03/31/21 06/29/21  Terrilee Croak, MD  atorvastatin (LIPITOR) 20 MG tablet  03/31/21   [provider]  Calcium Carb-Cholecalciferol (CALCIUM 600 + D) 600-5 MG-MCG TABS 1 tablet  with a meal    [provider]  cholecalciferol (VITAMIN D3) 25 MCG (1000 UNIT) tablet     [provider]  cyanocobalamin (VITAMIN B12) 1000 MCG tablet Take 1,000 mcg by mouth daily.    [provider]  ferrous sulfate 325 (65 FE) MG tablet Take 325 mg by mouth daily with breakfast.    [provider]  folic acid (FOLVITE) 1 MG tablet 1 tablet    [provider]  hydroxychloroquine (PLAQUENIL) 200 MG tablet     [provider]  Levothyroxine Sodium 125 MCG CAPS     [provider]  Multiple Vitamin (MULTIVITAMIN)  tablet Take 1 tablet by mouth daily.    [provider]  olopatadine (PATANOL) 0.1 % ophthalmic solution  08/07/21   [provider]  ondansetron (ZOFRAN ODT) 4 MG disintegrating tablet Take 1 tablet (4 mg total) by mouth every 8 (eight) hours as needed for nausea or vomiting. 12/06/18   Volanda Napoleon, PA-C  sertraline (ZOLOFT) 25 MG tablet Take 1 tablet (25 mg total) by mouth daily. 12/14/21   Frann Rider, NP   Social History   Socioeconomic History   Marital status: Divorced    Spouse name: Not on file   Number of children: Not on file   Years of education: Not on file   Highest education level: Not on file  Occupational History   Not on file  Tobacco Use   Smoking status: Former   Smokeless tobacco: Never  Vaping Use   Vaping Use: Never used  Substance and Sexual Activity   Alcohol use: Yes    Comment: rare   Drug use: No   Sexual activity: Not on file  Other Topics Concern   Not on file  Social History Narrative   Not on file   Social Determinants of Health   Financial Resource Strain: Not on file  Food Insecurity: Not on file  Transportation Needs: Not on file  Physical Activity: Not on file  Stress: Not on file  Social Connections: Not on file   Family History  Problem Relation Age of Onset   Glaucoma Mother    GER disease Mother    Colon polyps Mother    Breast cancer Other 68       Maternal Great Grandmother   Osteoarthritis Father    Alzheimer's disease Father    Colon polyps Maternal Grandmother    Colon polyps Maternal Grandfather     ROS: Currently denies lightheadedness, dizziness, Fever, chills, CP, SOB.   No personal history of DVT, PE, MI. Hx of CVA No loose teeth or dentures All other systems have been reviewed and were otherwise currently negative with the exception of those mentioned in the HPI and as above.  Objective: Vitals: Ht: '5\' 4"'$  Wt: 135 lbs Temp: 98 BP: 130/63 Pulse: 47 O2 98% on room air.   Physical  Exam: General: Alert, NAD. Trendelenberg Gait  HEENT: EOMI, Good Neck Extension  Pulm: No increased work of breathing.  Clear B/L A/P w/o crackle or wheeze.  CV: RRR, No m/g/r appreciated  GI: soft, NT, ND Neuro: Neuro without gross focal deficit.  Sensation intact distally Skin: No lesions in the area of chief complaint MSK/Surgical Site: left Hip pain with passive ROM. 0-60 deg FF. Positive Stinchfield.  5/5 strength.  NVI.    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   The bone quality appears to be adequate for  age and reported activity level.  Preoperative templating of the joint replacement has been completed, documented, and submitted to the Operating Room personnel in order to optimize intra-operative equipment management.  Assessment: djd left hip   Plan: Plan for Procedure(s): TOTAL HIP ARTHROPLASTY  The patient history, physical exam, clinical judgement of the provider and imaging are consistent with end stage degenerative joint disease and total joint arthroplasty is deemed medically necessary. The treatment options including medical management, injection therapy, and arthroplasty were discussed at length. The risks and benefits of Procedure(s): TOTAL HIP ARTHROPLASTY were presented and reviewed.  The risks of nonoperative treatment, versus surgical intervention including but not limited to continued pain, aseptic loosening, stiffness, dislocation/subluxation, infection, bleeding, nerve injury, blood clots, cardiopulmonary complications, morbidity, mortality, among others were discussed. The patient verbalizes understanding and wishes to proceed with the plan.  Patient is being admitted for surgery, pain control, PT, prophylactic antibiotics, VTE prophylaxis, progressive ambulation, ADL's and discharge planning.   Dental prophylaxis discussed and recommended for 2 years postoperatively.  The patient does meet the  criteria for TXA which will be used perioperatively.   Xarelto  will be used postoperatively for DVT prophylaxis in addition to SCDs, and early ambulation. The patient is planning to be discharged home with HHPT in care of family and family caregivers    Ventura Bruns, Hershal Coria 01/31/2022 5:09 PM

## 2022-02-02 DIAGNOSIS — N3941 Urge incontinence: Secondary | ICD-10-CM | POA: Diagnosis not present

## 2022-02-02 DIAGNOSIS — Z8551 Personal history of malignant neoplasm of bladder: Secondary | ICD-10-CM | POA: Diagnosis not present

## 2022-02-03 NOTE — Progress Notes (Addendum)
Anesthesia Review:  PCP: Was Jennifer Franco who just retired now is DR Jennifer Franco at [facility] : none  Neurology- Jennifer Franco [facility] [place] 12/14/21  Chest x-ray :07/17/21- 2 view  Carotids- 03/31/21  EKG :07/19/21  Echo : 03/31/21  Stress test: Cardiac Cath :  Activity level: can do a flight of stairs without difficutgly  Sleep Study/ CPAP : none  Fasting Blood Sugar :      / Checks Blood Sugar -- times a day:   Blood Thinner/ Instructions /Last Dose: ASA / Instructions/ Last Dose :   PT forgot 0900am appt on 02/08/2022.  Med hx and instrucdtions completed via phone.  Pt to come in on 10.3.2023 at 1100am for labs. PT did come for labs on 02/08/22.   PT lives at home with her mother who requires caregivers.

## 2022-02-07 NOTE — Patient Instructions (Addendum)
SURGICAL WAITING ROOM VISITATION Patients having surgery or a procedure may have no more than 2 support people in the waiting area - these visitors may rotate.   Children under the age of 52 must have an adult with them who is not the patient. If the patient needs to stay at the hospital during part of their recovery, the visitor guidelines for inpatient rooms apply. Pre-op nurse will coordinate an appropriate time for 1 support person to accompany patient in pre-op.  This support person may not rotate.    Please refer to the [facility] website for the visitor guidelines for Inpatients (after your surgery is over and you are in a regular room).       Your procedure is scheduled on:  02/15/2022    Report to [facility] Main Entrance    Report to admitting at   0800AM   Call this number if you have problems the morning of surgery [phone]   Do not eat food :After Midnight.   After Midnight you may have the following liquids until __ 0730____ AM DAY OF SURGERY  Water Non-Citrus Juices (without pulp, NO RED) Carbonated Beverages Black Coffee (NO MILK/CREAM OR CREAMERS, sugar ok)  Clear Tea (NO MILK/CREAM OR CREAMERS, sugar ok) regular and decaf                             Plain Jell-O (NO RED)                                           Fruit ices (not with fruit pulp, NO RED)                                     Popsicles (NO RED)                                                               Sports drinks like Gatorade (NO RED)                     The day of surgery:  Drink ONE (1) Pre-Surgery Clear Ensure or G2 at  0730 am  (  have completed by )   Drink in one sitting. Do not sip.  This drink was given to you during your hospital  pre-op appointment visit. Nothing else to drink after completing the  Pre-Surgery Clear Ensure or G2.          If you have questions, please contact your surgeon's office.     Oral Hygiene is also important to reduce your risk of  infection.                                    Remember - BRUSH YOUR TEETH THE MORNING OF SURGERY WITH YOUR REGULAR TOOTHPASTE   Do NOT smoke after Midnight   Take these medicines the morning of surgery with A SIP OF WATER:  levothyroxine,  You may not have any metal on your body including hair pins, jewelry, and body piercing             Do not wear make-up, lotions, powders, perfumes/cologne, or deodorant  Do not wear nail polish including gel and S&S, artificial/acrylic nails, or any other type of covering on natural nails including finger and toenails. If you have artificial nails, gel coating, etc. that needs to be removed by a nail salon please have this removed prior to surgery or surgery may need to be canceled/ delayed if the surgeon/ anesthesia feels like they are unable to be safely monitored.   Do not shave  48 hours prior to surgery.               Men may shave face and neck.   Do not bring valuables to the hospital. [place].   Contacts, dentures or bridgework may not be worn into surgery.   Bring small overnight bag day of surgery.   DO NOT [place]. PHARMACY WILL DISPENSE MEDICATIONS LISTED ON YOUR MEDICATION LIST TO YOU DURING YOUR ADMISSION [place]!    Patients discharged on the day of surgery will not be allowed to drive home.  Someone NEEDS to stay with you for the first 24 hours after anesthesia.   Special Instructions: Bring a copy of your healthcare power of attorney and living will documents the day of surgery if you haven't scanned them before.              Please read over the following fact sheets you were given: IF [place] [phone]   If you received a COVID test during your pre-op visit  it is requested that you wear a mask when out in public, stay away from anyone that may  not be feeling well and notify your surgeon if you develop symptoms. If you test positive for Covid or have been in contact with anyone that has tested positive in the last 10 days please notify you surgeon.     [facility] - Preparing for Surgery Before surgery, you can play an important role.  Because skin is not sterile, your skin needs to be as free of germs as possible.  You can reduce the number of germs on your skin by washing with CHG (chlorahexidine gluconate) soap before surgery.  CHG is an antiseptic cleaner which kills germs and bonds with the skin to continue killing germs even after washing. Please DO NOT use if you have an allergy to CHG or antibacterial soaps.  If your skin becomes reddened/irritated stop using the CHG and inform your nurse when you arrive at Short Stay. Do not shave (including legs and underarms) for at least 48 hours prior to the first CHG shower.  You may shave your face/neck. Please follow these instructions carefully:  1.  Shower with CHG Soap the night before surgery and the  morning of Surgery.  2.  If you choose to wash your hair, wash your hair first as usual with your  normal  shampoo.  3.  After you shampoo, rinse your hair and body thoroughly to remove the  shampoo.  4.  Use CHG as you would any other liquid soap.  You can apply chg directly  to the skin and wash                       Gently with a scrungie or clean washcloth.  5.  Apply the CHG Soap to your body ONLY FROM THE NECK DOWN.   Do not use on face/ open                           Wound or open sores. Avoid contact with eyes, ears mouth and genitals (private parts).                       Wash face,  Genitals (private parts) with your normal soap.             6.  Wash thoroughly, paying special attention to the area where your surgery  will be performed.  7.  Thoroughly rinse your body with warm water from the neck down.  8.  DO NOT shower/wash with your normal soap after  using and rinsing off  the CHG Soap.                9.  Pat yourself dry with a clean towel.            10.  Wear clean pajamas.            11.  Place clean sheets on your bed the night of your first shower and do not  sleep with pets. Day of Surgery : Do not apply any lotions/deodorants the morning of surgery.  Please wear clean clothes to the hospital/surgery center.  FAILURE TO FOLLOW THESE INSTRUCTIONS MAY RESULT IN THE CANCELLATION OF YOUR SURGERY PATIENT SIGNATURE_________________________________  NURSE SIGNATURE__________________________________  ________________________________________________________________________

## 2022-02-08 ENCOUNTER — Encounter (HOSPITAL_COMMUNITY)
Admission: RE | Admit: 2022-02-08 | Discharge: 2022-02-08 | Disposition: A | Discharge status: Home / Self Care | Payer: Medicare PPO | Source: Ambulatory Visit | Attending: Orthopedic Surgery | Admitting: Orthopedic Surgery

## 2022-02-08 ENCOUNTER — Encounter (HOSPITAL_COMMUNITY): Payer: Self-pay

## 2022-02-08 ENCOUNTER — Other Ambulatory Visit: Payer: Self-pay

## 2022-02-08 VITALS — BP 114/66 | HR 63 | Temp 98.2°F | Resp 16

## 2022-02-08 DIAGNOSIS — Z01818 Encounter for other preprocedural examination: Secondary | ICD-10-CM

## 2022-02-08 DIAGNOSIS — Z01812 Encounter for preprocedural laboratory examination: Secondary | ICD-10-CM | POA: Diagnosis not present

## 2022-02-08 HISTORY — DX: Transient cerebral ischemic attack, unspecified: G45.9

## 2022-02-08 LAB — CBC
HCT: 41.7 % (ref 36.0–46.0)
Hemoglobin: 13.3 g/dL (ref 12.0–15.0)
MCH: 28.5 pg (ref 26.0–34.0)
MCHC: 31.9 g/dL (ref 30.0–36.0)
MCV: 89.5 fL (ref 80.0–100.0)
Platelets: 284 10*3/uL (ref 150–400)
RBC: 4.66 MIL/uL (ref 3.87–5.11)
RDW: 14.7 % (ref 11.5–15.5)
WBC: 3.8 10*3/uL — ABNORMAL LOW (ref 4.0–10.5)
nRBC: 0 % (ref 0.0–0.2)

## 2022-02-08 LAB — BASIC METABOLIC PANEL
Anion gap: 7 (ref 5–15)
BUN: 10 mg/dL (ref 8–23)
CO2: 29 mmol/L (ref 22–32)
Calcium: 9.8 mg/dL (ref 8.9–10.3)
Chloride: 108 mmol/L (ref 98–111)
Creatinine, Ser: 0.61 mg/dL (ref 0.44–1.00)
GFR, Estimated: >60 mL/min (ref >60)
Glucose, Bld: 110 mg/dL — ABNORMAL HIGH (ref 70–99)
Potassium: 4.1 mmol/L (ref 3.5–5.1)
Sodium: 144 mmol/L (ref 135–145)

## 2022-02-08 LAB — SURGICAL PCR SCREEN
MRSA, PCR: NEGATIVE
Staphylococcus aureus: NEGATIVE

## 2022-02-10 NOTE — Care Plan (Signed)
Ortho Bundle Case Management Note  Patient Details  Name: Jennifer Franco MRN: [id number] Date of Birth: Aug 25, 1955   met with patient in the office for H&P. will discharge to home with family/friends to assist. 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 states that her son is coming to town to help. has all equipment at home. HHPT referral to [place] and [place] set up with [facility]. discharge instructions discussed and questions answered. she is very emotional and cried during most of the appointment. some poor recalled noted when discussing history.  Patient and MD in agreement with plan. Choice offered                 DME Arranged:    DME Agency:     HH Arranged:  PT HH Agency:  [facility]  Additional Comments: Please contact me with any questions of if this plan should need to change.  Ladell Heads,  [facility]  [phone] 02/10/2022, 11:33 AM

## 2022-02-11 ENCOUNTER — Other Ambulatory Visit: Payer: Medicare PPO

## 2022-02-15 ENCOUNTER — Ambulatory Visit (HOSPITAL_BASED_OUTPATIENT_CLINIC_OR_DEPARTMENT_OTHER): Payer: Medicare PPO | Admitting: Certified Registered"

## 2022-02-15 ENCOUNTER — Ambulatory Visit (HOSPITAL_COMMUNITY)
Admission: RE | Admit: 2022-02-15 | Discharge: 2022-02-16 | Disposition: A | Discharge status: Home / Self Care | Payer: Medicare PPO | Attending: Orthopedic Surgery | Admitting: Orthopedic Surgery

## 2022-02-15 ENCOUNTER — Other Ambulatory Visit: Payer: Self-pay

## 2022-02-15 ENCOUNTER — Encounter (HOSPITAL_COMMUNITY): Payer: Self-pay | Admitting: Orthopedic Surgery

## 2022-02-15 ENCOUNTER — Ambulatory Visit (HOSPITAL_COMMUNITY): Payer: Medicare PPO

## 2022-02-15 ENCOUNTER — Encounter (HOSPITAL_COMMUNITY)
Admission: RE | Disposition: A | Discharge status: Home / Self Care | Payer: Self-pay | Source: Home / Self Care | Attending: Orthopedic Surgery

## 2022-02-15 ENCOUNTER — Ambulatory Visit (HOSPITAL_COMMUNITY): Payer: Medicare PPO | Admitting: Physician Assistant

## 2022-02-15 DIAGNOSIS — T797XXA Traumatic subcutaneous emphysema, initial encounter: Secondary | ICD-10-CM | POA: Diagnosis not present

## 2022-02-15 DIAGNOSIS — M797 Fibromyalgia: Secondary | ICD-10-CM

## 2022-02-15 DIAGNOSIS — Z471 Aftercare following joint replacement surgery: Secondary | ICD-10-CM | POA: Diagnosis not present

## 2022-02-15 DIAGNOSIS — F419 Anxiety disorder, unspecified: Secondary | ICD-10-CM | POA: Diagnosis not present

## 2022-02-15 DIAGNOSIS — M069 Rheumatoid arthritis, unspecified: Secondary | ICD-10-CM | POA: Diagnosis not present

## 2022-02-15 DIAGNOSIS — M3500 Sicca syndrome, unspecified: Secondary | ICD-10-CM | POA: Insufficient documentation

## 2022-02-15 DIAGNOSIS — E039 Hypothyroidism, unspecified: Secondary | ICD-10-CM | POA: Insufficient documentation

## 2022-02-15 DIAGNOSIS — Z8673 Personal history of transient ischemic attack (TIA), and cerebral infarction without residual deficits: Secondary | ICD-10-CM | POA: Insufficient documentation

## 2022-02-15 DIAGNOSIS — Z96642 Presence of left artificial hip joint: Secondary | ICD-10-CM | POA: Diagnosis not present

## 2022-02-15 DIAGNOSIS — F32A Depression, unspecified: Secondary | ICD-10-CM | POA: Diagnosis not present

## 2022-02-15 DIAGNOSIS — M1612 Unilateral primary osteoarthritis, left hip: Secondary | ICD-10-CM | POA: Insufficient documentation

## 2022-02-15 DIAGNOSIS — Z87891 Personal history of nicotine dependence: Secondary | ICD-10-CM | POA: Diagnosis not present

## 2022-02-15 DIAGNOSIS — F418 Other specified anxiety disorders: Secondary | ICD-10-CM | POA: Diagnosis not present

## 2022-02-15 DIAGNOSIS — Z01818 Encounter for other preprocedural examination: Secondary | ICD-10-CM

## 2022-02-15 HISTORY — PX: TOTAL HIP ARTHROPLASTY: SHX124

## 2022-02-15 SURGERY — ARTHROPLASTY, HIP, TOTAL,POSTERIOR APPROACH
Anesthesia: Spinal | Site: Hip | Laterality: Left

## 2022-02-15 MED ORDER — PHENOL 1.4 % MT LIQD: 1.0000 | OROMUCOSAL | Status: DC | PRN

## 2022-02-15 MED ORDER — SUCCINYLCHOLINE CHLORIDE 200 MG/10ML IV SOSY
PREFILLED_SYRINGE | INTRAVENOUS | Status: AC
  Filled 2022-02-15: qty 10

## 2022-02-15 MED ORDER — KETOROLAC TROMETHAMINE 30 MG/ML IJ SOLN
INTRAMUSCULAR | Status: AC
  Filled 2022-02-15: qty 1

## 2022-02-15 MED ORDER — ONDANSETRON HCL 4 MG/2ML IJ SOLN
INTRAMUSCULAR | Status: AC
  Filled 2022-02-15: qty 2

## 2022-02-15 MED ORDER — TRANEXAMIC ACID-NACL 1000-0.7 MG/100ML-% IV SOLN
1000.0000 mg | Freq: Once | INTRAVENOUS | Status: AC
  Administered 2022-02-15: 1000 mg via INTRAVENOUS
  Filled 2022-02-15: qty 100

## 2022-02-15 MED ORDER — CEFAZOLIN SODIUM-DEXTROSE 2-4 GM/100ML-% IV SOLN
2.0000 g | INTRAVENOUS | Status: AC
  Administered 2022-02-15: 2 g via INTRAVENOUS
  Filled 2022-02-15: qty 100

## 2022-02-15 MED ORDER — ONDANSETRON HCL 4 MG PO TABS: 4.0000 mg | ORAL_TABLET | Freq: Four times a day (QID) | ORAL | Status: DC | PRN

## 2022-02-15 MED ORDER — PHENYLEPHRINE HCL-NACL 20-0.9 MG/250ML-% IV SOLN
INTRAVENOUS | Status: DC | PRN
  Administered 2022-02-15: 50 ug/min via INTRAVENOUS

## 2022-02-15 MED ORDER — ATORVASTATIN CALCIUM 20 MG PO TABS
20.0000 mg | ORAL_TABLET | Freq: Every day | ORAL | Status: DC
  Administered 2022-02-15 – 2022-02-16 (×2): 20 mg via ORAL
  Filled 2022-02-15 (×2): qty 1

## 2022-02-15 MED ORDER — ORAL CARE MOUTH RINSE: 15.0000 mL | Freq: Once | OROMUCOSAL | Status: AC

## 2022-02-15 MED ORDER — BUPIVACAINE HCL (PF) 0.25 % IJ SOLN
INTRAMUSCULAR | Status: AC
  Filled 2022-02-15: qty 30

## 2022-02-15 MED ORDER — MIDAZOLAM HCL 2 MG/2ML IJ SOLN
INTRAMUSCULAR | Status: DC | PRN
  Administered 2022-02-15: 2 mg via INTRAVENOUS

## 2022-02-15 MED ORDER — FERROUS SULFATE 325 (65 FE) MG PO TABS
325.0000 mg | ORAL_TABLET | Freq: Every day | ORAL | Status: DC
  Administered 2022-02-16: 325 mg via ORAL
  Filled 2022-02-15: qty 1

## 2022-02-15 MED ORDER — SODIUM CHLORIDE (PF) 0.9 % IJ SOLN
INTRAMUSCULAR | Status: AC
  Filled 2022-02-15: qty 10

## 2022-02-15 MED ORDER — POLYETHYLENE GLYCOL 3350 17 G PO PACK: 17.0000 g | PACK | Freq: Every day | ORAL | Status: DC | PRN

## 2022-02-15 MED ORDER — KETOROLAC TROMETHAMINE 30 MG/ML IJ SOLN
INTRAMUSCULAR | Status: DC | PRN
  Administered 2022-02-15: 30 mg

## 2022-02-15 MED ORDER — MAGNESIUM CITRATE PO SOLN: 1.0000 | Freq: Once | ORAL | Status: DC | PRN

## 2022-02-15 MED ORDER — TRANEXAMIC ACID-NACL 1000-0.7 MG/100ML-% IV SOLN
1000.0000 mg | INTRAVENOUS | Status: AC
  Administered 2022-02-15: 1000 mg via INTRAVENOUS
  Filled 2022-02-15: qty 100

## 2022-02-15 MED ORDER — PHENYLEPHRINE HCL (PRESSORS) 10 MG/ML IV SOLN
INTRAVENOUS | Status: AC
  Filled 2022-02-15: qty 1

## 2022-02-15 MED ORDER — BISACODYL 10 MG RE SUPP: 10.0000 mg | Freq: Every day | RECTAL | Status: DC | PRN

## 2022-02-15 MED ORDER — MENTHOL 3 MG MT LOZG: 1.0000 | LOZENGE | OROMUCOSAL | Status: DC | PRN

## 2022-02-15 MED ORDER — DEXAMETHASONE SODIUM PHOSPHATE 10 MG/ML IJ SOLN
INTRAMUSCULAR | Status: AC
  Filled 2022-02-15: qty 1

## 2022-02-15 MED ORDER — FENTANYL CITRATE (PF) 100 MCG/2ML IJ SOLN
INTRAMUSCULAR | Status: AC
  Filled 2022-02-15: qty 2

## 2022-02-15 MED ORDER — BUPIVACAINE HCL (PF) 0.25 % IJ SOLN
INTRAMUSCULAR | Status: DC | PRN
  Administered 2022-02-15: 30 mL

## 2022-02-15 MED ORDER — ACETAMINOPHEN 500 MG PO TABS
1000.0000 mg | ORAL_TABLET | Freq: Once | ORAL | Status: AC
  Administered 2022-02-15: 1000 mg via ORAL
  Filled 2022-02-15: qty 2

## 2022-02-15 MED ORDER — SERTRALINE HCL 25 MG PO TABS
25.0000 mg | ORAL_TABLET | Freq: Every day | ORAL | Status: DC
  Administered 2022-02-16: 25 mg via ORAL
  Filled 2022-02-15: qty 1

## 2022-02-15 MED ORDER — FENTANYL CITRATE PF 50 MCG/ML IJ SOSY: 25.0000 ug | PREFILLED_SYRINGE | INTRAMUSCULAR | Status: DC | PRN

## 2022-02-15 MED ORDER — MORPHINE SULFATE (PF) 2 MG/ML IV SOLN: 0.5000 mg | INTRAVENOUS | Status: DC | PRN

## 2022-02-15 MED ORDER — DOCUSATE SODIUM 100 MG PO CAPS
100.0000 mg | ORAL_CAPSULE | Freq: Two times a day (BID) | ORAL | Status: DC
  Administered 2022-02-15 – 2022-02-16 (×2): 100 mg via ORAL
  Filled 2022-02-15 (×2): qty 1

## 2022-02-15 MED ORDER — POVIDONE-IODINE 7.5 % EX SOLN: Freq: Once | CUTANEOUS | Status: AC

## 2022-02-15 MED ORDER — FENTANYL CITRATE (PF) 100 MCG/2ML IJ SOLN
INTRAMUSCULAR | Status: DC | PRN
  Administered 2022-02-15: 50 ug via INTRAVENOUS

## 2022-02-15 MED ORDER — METHOCARBAMOL 500 MG IVPB - SIMPLE MED: 500.0000 mg | Freq: Four times a day (QID) | INTRAVENOUS | Status: DC | PRN

## 2022-02-15 MED ORDER — PHENYLEPHRINE 80 MCG/ML (10ML) SYRINGE FOR IV PUSH (FOR BLOOD PRESSURE SUPPORT)
PREFILLED_SYRINGE | INTRAVENOUS | Status: AC
  Filled 2022-02-15: qty 10

## 2022-02-15 MED ORDER — RIVAROXABAN 10 MG PO TABS
10.0000 mg | ORAL_TABLET | Freq: Every day | ORAL | Status: DC
  Administered 2022-02-16: 10 mg via ORAL
  Filled 2022-02-15: qty 1

## 2022-02-15 MED ORDER — PROPOFOL 10 MG/ML IV BOLUS
INTRAVENOUS | Status: DC | PRN
  Administered 2022-02-15 (×3): 20 mg via INTRAVENOUS
  Administered 2022-02-15: 30 mg via INTRAVENOUS
  Administered 2022-02-15 (×3): 10 mg via INTRAVENOUS

## 2022-02-15 MED ORDER — EPHEDRINE 5 MG/ML INJ
INTRAVENOUS | Status: AC
  Filled 2022-02-15: qty 5

## 2022-02-15 MED ORDER — 0.9 % SODIUM CHLORIDE (POUR BTL) OPTIME
TOPICAL | Status: DC | PRN
  Administered 2022-02-15: 1000 mL

## 2022-02-15 MED ORDER — ONDANSETRON HCL 4 MG/2ML IJ SOLN
INTRAMUSCULAR | Status: DC | PRN
  Administered 2022-02-15: 4 mg via INTRAVENOUS

## 2022-02-15 MED ORDER — MIDAZOLAM HCL 2 MG/2ML IJ SOLN
INTRAMUSCULAR | Status: AC
  Filled 2022-02-15: qty 2

## 2022-02-15 MED ORDER — BUPIVACAINE IN DEXTROSE 0.75-8.25 % IT SOLN
INTRATHECAL | Status: DC | PRN
  Administered 2022-02-15: 1.6 mL via INTRATHECAL

## 2022-02-15 MED ORDER — DEXAMETHASONE SODIUM PHOSPHATE 10 MG/ML IJ SOLN
10.0000 mg | Freq: Once | INTRAMUSCULAR | Status: AC
  Administered 2022-02-16: 10 mg via INTRAVENOUS
  Filled 2022-02-15: qty 1

## 2022-02-15 MED ORDER — OXYCODONE HCL 5 MG PO TABS
ORAL_TABLET | ORAL | Status: AC
  Filled 2022-02-15: qty 1

## 2022-02-15 MED ORDER — LEVOTHYROXINE SODIUM 125 MCG PO TABS
125.0000 ug | ORAL_TABLET | Freq: Every day | ORAL | Status: DC
  Administered 2022-02-16: 125 ug via ORAL
  Filled 2022-02-15: qty 1

## 2022-02-15 MED ORDER — OXYCODONE HCL 5 MG/5ML PO SOLN: 5.0000 mg | Freq: Once | ORAL | Status: AC | PRN

## 2022-02-15 MED ORDER — ATROPINE SULFATE 1 MG/ML IV SOLN
INTRAVENOUS | Status: DC | PRN
  Administered 2022-02-15: .1 mg via INTRAVENOUS
  Administered 2022-02-15: .2 mg via INTRAVENOUS

## 2022-02-15 MED ORDER — HYDROCODONE-ACETAMINOPHEN 5-325 MG PO TABS
1.0000 | ORAL_TABLET | ORAL | Status: DC | PRN
  Administered 2022-02-15: 1 via ORAL
  Filled 2022-02-15: qty 1

## 2022-02-15 MED ORDER — CEFAZOLIN SODIUM-DEXTROSE 1-4 GM/50ML-% IV SOLN
1.0000 g | Freq: Four times a day (QID) | INTRAVENOUS | Status: AC
  Administered 2022-02-15: 1 g via INTRAVENOUS
  Filled 2022-02-15 (×2): qty 50

## 2022-02-15 MED ORDER — ACETAMINOPHEN 325 MG PO TABS: 325.0000 mg | ORAL_TABLET | Freq: Four times a day (QID) | ORAL | Status: DC | PRN

## 2022-02-15 MED ORDER — METOCLOPRAMIDE HCL 5 MG/ML IJ SOLN: 5.0000 mg | Freq: Three times a day (TID) | INTRAMUSCULAR | Status: DC | PRN

## 2022-02-15 MED ORDER — METHOCARBAMOL 500 MG PO TABS
500.0000 mg | ORAL_TABLET | Freq: Four times a day (QID) | ORAL | Status: DC | PRN
  Administered 2022-02-15: 500 mg via ORAL
  Filled 2022-02-15: qty 1

## 2022-02-15 MED ORDER — DEXAMETHASONE SODIUM PHOSPHATE 10 MG/ML IJ SOLN
INTRAMUSCULAR | Status: DC | PRN
  Administered 2022-02-15: 10 mg via INTRAVENOUS

## 2022-02-15 MED ORDER — ONDANSETRON HCL 4 MG/2ML IJ SOLN: 4.0000 mg | Freq: Four times a day (QID) | INTRAMUSCULAR | Status: DC | PRN

## 2022-02-15 MED ORDER — ATROPINE SULFATE 1 MG/10ML IJ SOSY
PREFILLED_SYRINGE | INTRAMUSCULAR | Status: AC
  Filled 2022-02-15: qty 10

## 2022-02-15 MED ORDER — ACETAMINOPHEN 500 MG PO TABS
500.0000 mg | ORAL_TABLET | Freq: Four times a day (QID) | ORAL | Status: AC
  Administered 2022-02-15 – 2022-02-16 (×4): 500 mg via ORAL
  Filled 2022-02-15 (×4): qty 1

## 2022-02-15 MED ORDER — OXYCODONE HCL 5 MG PO TABS
5.0000 mg | ORAL_TABLET | Freq: Once | ORAL | Status: AC | PRN
  Administered 2022-02-15: 5 mg via ORAL

## 2022-02-15 MED ORDER — GLYCOPYRROLATE PF 0.2 MG/ML IJ SOSY
PREFILLED_SYRINGE | INTRAMUSCULAR | Status: DC | PRN
  Administered 2022-02-15: .2 mg via INTRAVENOUS

## 2022-02-15 MED ORDER — LACTATED RINGERS IV SOLN: INTRAVENOUS | Status: DC

## 2022-02-15 MED ORDER — METOCLOPRAMIDE HCL 5 MG PO TABS: 5.0000 mg | ORAL_TABLET | Freq: Three times a day (TID) | ORAL | Status: DC | PRN

## 2022-02-15 MED ORDER — PROPOFOL 500 MG/50ML IV EMUL
INTRAVENOUS | Status: DC | PRN
  Administered 2022-02-15: 40 ug/kg/min via INTRAVENOUS

## 2022-02-15 MED ORDER — ALUM & MAG HYDROXIDE-SIMETH 200-200-20 MG/5ML PO SUSP: 30.0000 mL | ORAL | Status: DC | PRN

## 2022-02-15 MED ORDER — ACETAMINOPHEN 500 MG PO TABS: 1000.0000 mg | ORAL_TABLET | Freq: Once | ORAL | Status: DC

## 2022-02-15 MED ORDER — POVIDONE-IODINE 10 % EX SWAB: 2.0000 | Freq: Once | CUTANEOUS | Status: DC

## 2022-02-15 MED ORDER — CHLORHEXIDINE GLUCONATE 0.12 % MT SOLN
15.0000 mL | Freq: Once | OROMUCOSAL | Status: AC
  Administered 2022-02-15: 15 mL via OROMUCOSAL

## 2022-02-15 MED ORDER — POTASSIUM CHLORIDE IN NACL 20-0.9 MEQ/L-% IV SOLN
INTRAVENOUS | Status: DC
  Filled 2022-02-15 (×2): qty 1000

## 2022-02-15 MED ORDER — DIPHENHYDRAMINE HCL 12.5 MG/5ML PO ELIX: 12.5000 mg | ORAL_SOLUTION | ORAL | Status: DC | PRN

## 2022-02-15 MED ORDER — HYDROCODONE-ACETAMINOPHEN 7.5-325 MG PO TABS
1.0000 | ORAL_TABLET | ORAL | Status: DC | PRN
  Administered 2022-02-15: 2 via ORAL
  Administered 2022-02-16 (×2): 1 via ORAL
  Filled 2022-02-15: qty 1
  Filled 2022-02-15: qty 2
  Filled 2022-02-15: qty 1

## 2022-02-15 MED ORDER — STERILE WATER FOR IRRIGATION IR SOLN
Status: DC | PRN
  Administered 2022-02-15: 2000 mL

## 2022-02-15 SURGICAL SUPPLY — 59 items
BAG COUNTER SPONGE SURGICOUNT (BAG) IMPLANT
BAG SPNG CNTER NS LX DISP (BAG) ×1
BLADE SAW SGTL 73X25 THK (BLADE) ×2 IMPLANT
CLSR STERI-STRIP ANTIMIC 1/2X4 (GAUZE/BANDAGES/DRESSINGS) ×4 IMPLANT
COVER SURGICAL LIGHT HANDLE (MISCELLANEOUS) ×2 IMPLANT
DRAPE INCISE IOBAN 66X45 STRL (DRAPES) ×2 IMPLANT
DRAPE ORTHO SPLIT 77X108 STRL (DRAPES) ×2
DRAPE POUCH INSTRU U-SHP 10X18 (DRAPES) ×2 IMPLANT
DRAPE SHEET LG 3/4 BI-LAMINATE (DRAPES) ×2 IMPLANT
DRAPE SURG 17X11 SM STRL (DRAPES) ×2 IMPLANT
DRAPE SURG ORHT 6 SPLT 77X108 (DRAPES) ×4 IMPLANT
DRAPE U-SHAPE 47X51 STRL (DRAPES) ×2 IMPLANT
DRSG AQUACEL AG ADV 3.5X10 (GAUZE/BANDAGES/DRESSINGS) IMPLANT
DRSG MEPILEX POST OP 4X8 (GAUZE/BANDAGES/DRESSINGS) ×2 IMPLANT
DURAPREP 26ML APPLICATOR (WOUND CARE) ×4 IMPLANT
ELECT BLADE TIP CTD 4 INCH (ELECTRODE) ×2 IMPLANT
ELECT REM PT RETURN 15FT ADLT (MISCELLANEOUS) ×2 IMPLANT
ELIMINATOR HOLE APEX DEPUY (Hips) IMPLANT
FACESHIELD WRAPAROUND (MASK) ×2 IMPLANT
FACESHIELD WRAPAROUND OR TEAM (MASK) ×4 IMPLANT
GLOVE BIO SURGEON STRL SZ 6.5 (GLOVE) ×2 IMPLANT
GLOVE BIO SURGEON STRL SZ7.5 (GLOVE) ×2 IMPLANT
GLOVE BIOGEL PI IND STRL 7.0 (GLOVE) ×2 IMPLANT
GLOVE BIOGEL PI IND STRL 8 (GLOVE) ×2 IMPLANT
GOWN STRL SURGICAL XL XLNG (GOWN DISPOSABLE) ×4 IMPLANT
HEAD CERAMIC 36 PLUS5 (Hips) IMPLANT
HOOD PEEL AWAY FLYTE STAYCOOL (MISCELLANEOUS) ×4 IMPLANT
K-WIRE 2.0 (WIRE) ×1
K-WIRE TROCAR PT 2.0 150MM (WIRE) ×1
KIT BASIN OR (CUSTOM PROCEDURE TRAY) ×2 IMPLANT
KIT TURNOVER KIT A (KITS) IMPLANT
KWIRE TROCAR PT 2.0 150 (WIRE) ×2 IMPLANT
KWIRE TROCAR PT 2.0 150MM (WIRE) ×1 IMPLANT
LINER NEUTRAL 52X36MM PLUS 4 (Liner) IMPLANT
MANIFOLD NEPTUNE II (INSTRUMENTS) ×2 IMPLANT
NDL MA TROC 1/2 (NEEDLE) IMPLANT
NDL SAFETY ECLIP 18X1.5 (MISCELLANEOUS) ×4 IMPLANT
NEEDLE ANCHOR KEITH 2 7/8 STR (NEEDLE) ×2 IMPLANT
NEEDLE MA TROC 1/2 (NEEDLE) IMPLANT
NS IRRIG 1000ML POUR BTL (IV SOLUTION) ×2 IMPLANT
PACK TOTAL JOINT (CUSTOM PROCEDURE TRAY) ×2 IMPLANT
PIN SECTOR W/GRIP ACE CUP 52MM (Hips) IMPLANT
PROTECTOR NERVE ULNAR (MISCELLANEOUS) ×2 IMPLANT
SCREW 6.5MMX25MM (Screw) IMPLANT
SCREW PINN CAN BONE 6.5MMX15MM (Screw) IMPLANT
SUCTION FRAZIER HANDLE 12FR (TUBING) ×1
SUCTION TUBE FRAZIER 12FR DISP (TUBING) ×2 IMPLANT
SUT ETHIBOND NAB CT1 #1 30IN (SUTURE) ×6 IMPLANT
SUT VIC AB 0 CT1 36 (SUTURE) ×2 IMPLANT
SUT VIC AB 1 CT1 36 (SUTURE) ×4 IMPLANT
SUT VIC AB 2-0 CT1 27 (SUTURE) ×3
SUT VIC AB 2-0 CT1 TAPERPNT 27 (SUTURE) ×6 IMPLANT
SUT VIC AB 3-0 SH 8-18 (SUTURE) ×2 IMPLANT
SYR CONTROL 10ML LL (SYRINGE) ×4 IMPLANT
TAP DUOFIX SZ5 STD OFF (Hips) IMPLANT
TOWEL OR 17X26 10 PK STRL BLUE (TOWEL DISPOSABLE) ×2 IMPLANT
TRAY FOLEY MTR SLVR 16FR STAT (SET/KITS/TRAYS/PACK) ×2 IMPLANT
TUBE SUCTION HIGH CAP CLEAR NV (SUCTIONS) ×2 IMPLANT
WATER STERILE IRR 1000ML POUR (IV SOLUTION) ×4 IMPLANT

## 2022-02-15 NOTE — Anesthesia Postprocedure Evaluation (Signed)
Anesthesia Post Note  Patient: Jennifer Franco  Procedure(s) Performed: TOTAL HIP ARTHROPLASTY (Left: Hip)     Patient location during evaluation: PACU Anesthesia Type: Spinal Level of consciousness: awake and alert Pain management: pain level controlled Vital Signs Assessment: post-procedure vital signs reviewed and stable Respiratory status: spontaneous breathing, nonlabored ventilation and respiratory function stable Cardiovascular status: blood pressure returned to baseline Postop Assessment: no apparent nausea or vomiting, spinal receding, no headache and no backache Anesthetic complications: no   No notable events documented.  Last Vitals:  Vitals:   02/15/22 1400 02/15/22 1500  BP: 136/66 106/67  Pulse: (!) 44 (!) 47  Resp: 12 10  Temp:    SpO2: 99% 100%    Last Pain:  Vitals:   02/15/22 1500  TempSrc:   PainSc: 2                  Marthenia Rolling

## 2022-02-15 NOTE — Plan of Care (Signed)
  Problem: Education: Goal: Understanding of discharge needs will improve Outcome: Progressing   

## 2022-02-15 NOTE — Discharge Instructions (Addendum)
INSTRUCTIONS AFTER JOINT REPLACEMENT   Remove items at home which could result in a fall. This includes throw rugs or furniture in walking pathways ICE to the affected joint every three hours while awake for 30 minutes at a time, for at least the first 3-5 days, and then as needed for pain and swelling.  Continue to use ice for pain and swelling. You may notice swelling that will progress down to the foot and ankle.  This is normal after surgery.  Elevate your leg when you are not up walking on it.   Continue to use the breathing machine you got in the hospital (incentive spirometer) which will help keep your temperature down.  It is common for your temperature to cycle up and down following surgery, especially at night when you are not up moving around and exerting yourself.  The breathing machine keeps your lungs expanded and your temperature down.   DIET:  As you were doing prior to hospitalization, we recommend a well-balanced diet.  DRESSING / WOUND CARE / SHOWERING  You may shower 3 days after surgery, but keep the wounds dry during showering.  You may use an occlusive plastic wrap (Press'n Seal for example), NO SOAKING/SUBMERGING IN THE BATHTUB.  If the bandage gets wet, change with a clean dry gauze.  If the incision gets wet, pat the wound dry with a clean towel.  ACTIVITY  Increase activity slowly as tolerated, but follow the weight bearing instructions below.   No driving for 6 weeks or until further direction given by your physician.  You cannot drive while taking narcotics.  No lifting or carrying greater than 10 lbs. until further directed by your surgeon. Avoid periods of inactivity such as sitting longer than an hour when not asleep. This helps prevent blood clots.  You may return to work once you are authorized by your doctor.     WEIGHT BEARING   Weight bearing as tolerated with assist device (walker, cane, etc) as directed, use it as long as suggested by your surgeon or  therapist, typically at least 4-6 weeks.   EXERCISES  Results after joint replacement surgery are often greatly improved when you follow the exercise, range of motion and muscle strengthening exercises prescribed by your doctor. Safety measures are also important to protect the joint from further injury. Any time any of these exercises cause you to have increased pain or swelling, decrease what you are doing until you are comfortable again and then slowly increase them. If you have problems or questions, call your caregiver or physical therapist for advice.   Rehabilitation is important following a joint replacement. After just a few days of immobilization, the muscles of the leg can become weakened and shrink (atrophy).  These exercises are designed to build up the tone and strength of the thigh and leg muscles and to improve motion. Often times heat used for twenty to thirty minutes before working out will loosen up your tissues and help with improving the range of motion but do not use heat for the first two weeks following surgery (sometimes heat can increase post-operative swelling).   These exercises can be done on a training (exercise) mat, on the floor, on a table or on a bed. Use whatever works the best and is most comfortable for you.    Use music or television while you are exercising so that the exercises are a pleasant break in your day. This will make your life better with the exercises acting   as a break in your routine that you can look forward to.   Perform all exercises about fifteen times, three times per day or as directed.  You should exercise both the operative leg and the other leg as well.  Exercises include:   Quad Sets - Tighten up the muscle on the front of the thigh (Quad) and hold for 5-10 seconds.   Straight Leg Raises - With your knee straight (if you were given a brace, keep it on), lift the leg to 60 degrees, hold for 3 seconds, and slowly lower the leg.  Perform this  exercise against resistance later as your leg gets stronger.  Leg Slides: Lying on your back, slowly slide your foot toward your buttocks, bending your knee up off the floor (only go as far as is comfortable). Then slowly slide your foot back down until your leg is flat on the floor again.  Angel Wings: Lying on your back spread your legs to the side as far apart as you can without causing discomfort.  Hamstring Strength:  Lying on your back, push your heel against the floor with your leg straight by tightening up the muscles of your buttocks.  Repeat, but this time bend your knee to a comfortable angle, and push your heel against the floor.  You may put a pillow under the heel to make it more comfortable if necessary.   A rehabilitation program following joint replacement surgery can speed recovery and prevent re-injury in the future due to weakened muscles. Contact your doctor or a physical therapist for more information on knee rehabilitation.    CONSTIPATION  Constipation is defined medically as fewer than three stools per week and severe constipation as less than one stool per week.  Even if you have a regular bowel pattern at home, your normal regimen is likely to be disrupted due to multiple reasons following surgery.  Combination of anesthesia, postoperative narcotics, change in appetite and fluid intake all can affect your bowels.   YOU MUST use at least one of the following options; they are listed in order of increasing strength to get the job done.  They are all available over the counter, and you may need to use some, POSSIBLY even all of these options:    Drink plenty of fluids (prune juice may be helpful) and high fiber foods Colace 100 mg by mouth twice a day  Senokot for constipation as directed and as needed Dulcolax (bisacodyl), take with full glass of water  Miralax (polyethylene glycol) once or twice a day as needed.  If you have tried all these things and are unable to have a  bowel movement in the first 3-4 days after surgery call either your surgeon or your primary doctor.    If you experience loose stools or diarrhea, hold the medications until you stool forms back up.  If your symptoms do not get better within 1 week or if they get worse, check with your doctor.  If you experience "the worst abdominal pain ever" or develop nausea or vomiting, please contact the office immediately for further recommendations for treatment.   ITCHING:  If you experience itching with your medications, try taking only a single pain pill, or even half a pain pill at a time.  You can also use Benadryl over the counter for itching or also to help with sleep.   TED HOSE STOCKINGS:  Use stockings on both legs until for at least 2 weeks or as directed by  physician office. They may be removed at night for sleeping.  MEDICATIONS:  See your medication summary on the "After Visit Summary" that nursing will review with you.  You may have some home medications which will be placed on hold until you complete the course of blood thinner medication.  It is important for you to complete the blood thinner medication as prescribed. **Restart Hydroxychloroquine (Plaquenil) 3 days after surgery**  PRECAUTIONS:  If you experience chest pain or shortness of breath - call 911 immediately for transfer to the hospital emergency department.   If you develop a fever greater that 101 F, purulent drainage from wound, increased redness or drainage from wound, foul odor from the wound/dressing, or calf pain - CONTACT YOUR SURGEON.                                                   FOLLOW-UP APPOINTMENTS:  If you do not already have a post-op appointment, please call the office for an appointment to be seen by your surgeon.  Guidelines for how soon to be seen are listed in your "After Visit Summary", but are typically between 1-4 weeks after surgery.  OTHER INSTRUCTIONS:   POST-OPERATIVE OPIOID TAPER INSTRUCTIONS: It  is important to wean off of your opioid medication as soon as possible. If you do not need pain medication after your surgery it is ok to stop day one. Opioids include: Codeine, Hydrocodone(Norco, Vicodin), Oxycodone(Percocet, oxycontin) and hydromorphone amongst others.  Long term and even short term use of opiods can cause: Increased pain response Dependence Constipation Depression Respiratory depression And more.  Withdrawal symptoms can include Flu like symptoms Nausea, vomiting And more Techniques to manage these symptoms Hydrate well Eat regular healthy meals Stay active Use relaxation techniques(deep breathing, meditating, yoga) Do Not substitute Alcohol to help with tapering If you have been on opioids for less than two weeks and do not have pain than it is ok to stop all together.  Plan to wean off of opioids This plan should start within one week post op of your joint replacement. Maintain the same interval or time between taking each dose and first decrease the dose.  Cut the total daily intake of opioids by one tablet each day Next start to increase the time between doses. The last dose that should be eliminated is the evening dose.   MAKE SURE YOU:  Understand these instructions.  Get help right away if you are not doing well or get worse.    Thank you for letting us be a part of your medical care team.  It is a privilege we respect greatly.  We hope these instructions will help you stay on track for a fast and full recovery!    Information on my medicine - XARELTO (Rivaroxaban)  Why was Xarelto prescribed for you? Xarelto was prescribed for you to reduce the risk of blood clots forming after orthopedic surgery. The medical term for these abnormal blood clots is venous thromboembolism (VTE).  What do you need to know about xarelto ? Take your Xarelto ONCE DAILY at the same time every day. You may take it either with or without food.  If you have  difficulty swallowing the tablet whole, you may crush it and mix in applesauce just prior to taking your dose.  Take Xarelto exactly as prescribed by your  doctor and DO NOT stop taking Xarelto without talking to the doctor who prescribed the medication.  Stopping without other VTE prevention medication to take the place of Xarelto may increase your risk of developing a clot.  After discharge, you should have regular check-up appointments with your healthcare provider that is prescribing your Xarelto.    What do you do if you miss a dose? If you miss a dose, take it as soon as you remember on the same day then continue your regularly scheduled once daily regimen the next day. Do not take two doses of Xarelto on the same day.   Important Safety Information A possible side effect of Xarelto is bleeding. You should call your healthcare provider right away if you experience any of the following: Bleeding from an injury or your nose that does not stop. Unusual colored urine (red or dark brown) or unusual colored stools (red or black). Unusual bruising for unknown reasons. A serious fall or if you hit your head (even if there is no bleeding).  Some medicines may interact with Xarelto and might increase your risk of bleeding while on Xarelto. To help avoid this, consult your healthcare provider or pharmacist prior to using any new prescription or non-prescription medications, including herbals, vitamins, non-steroidal anti-inflammatory drugs (NSAIDs) and supplements.  This website has more information on Xarelto: [email or web address]

## 2022-02-15 NOTE — Anesthesia Preprocedure Evaluation (Addendum)
Anesthesia Evaluation  Patient identified by MRN, date of birth, ID band Patient awake    Reviewed: Allergy & Precautions, NPO status , Patient's Chart, lab work & pertinent test results  History of Anesthesia Complications Negative for: history of anesthetic complications  Airway Mallampati: II  TM Distance: >3 FB Neck ROM: Full    Dental no notable dental hx.    Pulmonary former smoker,    Pulmonary exam normal        Cardiovascular negative cardio ROS Normal cardiovascular exam     Neuro/Psych Anxiety Depression TIA   GI/Hepatic negative GI ROS, Neg liver ROS,   Endo/Other  Hypothyroidism   Renal/GU negative Renal ROS  negative genitourinary   Musculoskeletal  (+) Arthritis , Osteoarthritis and Rheumatoid disorders,  Fibromyalgia -  Abdominal   Peds  Hematology negative hematology ROS (+)   Anesthesia Other Findings Sjogren's syndrome   Reproductive/Obstetrics negative OB ROS                            Anesthesia Physical Anesthesia Plan  ASA: 3  Anesthesia Plan: Spinal   Post-op Pain Management: Tylenol PO (pre-op)*   Induction:   PONV Risk Score and Plan: 2 and Treatment may vary due to age or medical condition, Dexamethasone, Ondansetron and Midazolam  Airway Management Planned: Natural Airway and Simple Face Mask  Additional Equipment: None  Intra-op Plan:   Post-operative Plan:   Informed Consent: I have reviewed the patients History and Physical, chart, labs and discussed the procedure including the risks, benefits and alternatives for the proposed anesthesia with the patient or authorized representative who has indicated his/her understanding and acceptance.       Plan Discussed with: CRNA  Anesthesia Plan Comments:        Anesthesia Quick Evaluation

## 2022-02-15 NOTE — Anesthesia Procedure Notes (Signed)
Spinal  Patient location during procedure: OR Start time: 02/15/2022 10:37 AM End time: 02/15/2022 10:40 AM Reason for block: surgical anesthesia Staffing Performed: anesthesiologist  Anesthesiologist: Brennan Bailey, MD Performed by: Brennan Bailey, MD Authorized by: Brennan Bailey, MD   Preanesthetic Checklist Completed: patient identified, IV checked, risks and benefits discussed, surgical consent, monitors and equipment checked, pre-op evaluation and timeout performed Spinal Block Patient position: sitting Prep: DuraPrep and site prepped and draped Patient monitoring: continuous pulse ox, blood pressure and heart rate Approach: midline Location: L3-4 Injection technique: single-shot Needle Needle type: Pencan  Needle gauge: 24 G Needle length: 9 cm Assessment Events: CSF return and second provider Additional Notes Risks, benefits, and alternative discussed. Patient gave consent to procedure. Prepped and draped in sitting position. Patient sedated but responsive to voice. First attempt by CRNA unsuccessful at L3-4. Next attempt by myself 1cm caudad with clear CSF obtained after one needle redirection. Positive terminal aspiration. No pain or paraesthesias with injection. Patient tolerated procedure well. Vital signs stable. Tawny Asal, MD

## 2022-02-15 NOTE — Evaluation (Signed)
Physical Therapy Evaluation Patient Details Name: Jennifer Franco MRN: [id number] DOB: Sep 06, 1955 Today's Date: 02/15/2022  History of Present Illness  Pt is a 66yo female presenting s/p L-THA posterior approach on 02/15/22. PMH: angina, anemia, hx of bladder cancer, anxiety & depression, fibromyalgia, hypothyroidism, RA, Sjogren's syndrome, hx of TIA 2022-2023   Clinical Impression  Jennifer Franco is a 66 y.o. female POD 0 s/p L-THA, PA. Patient reports independence with mobility at baseline. Patient is now limited by functional impairments (see PT problem list below) and requires supervision for bed mobility and min guard for transfers. Patient was able to ambulate 20 feet with RW and min guard level of assist. Patient instructed in exercise to facilitate ROM and circulation to manage edema. Patient will benefit from continued skilled PT interventions to address impairments and progress towards PLOF. Acute PT will follow to progress mobility and stair training in preparation for safe discharge home.       Recommendations for follow up therapy are one component of a multi-disciplinary discharge planning process, led by the attending physician.  Recommendations may be updated based on patient status, additional functional criteria and insurance authorization.  Follow Up Recommendations Follow physician's recommendations for discharge plan and follow up therapies      Assistance Recommended at Discharge Frequent or constant Supervision/Assistance  Patient can return home with the following  A little help with walking and/or transfers;A little help with bathing/dressing/bathroom;Assistance with cooking/housework;Assist for transportation;Help with stairs or ramp for entrance    Equipment Recommendations None recommended by PT  Recommendations for Other Services       Functional Status Assessment Patient has had a recent decline in their functional status and demonstrates the  ability to make significant improvements in function in a reasonable and predictable amount of time.     Precautions / Restrictions Precautions Precautions: Fall Restrictions Weight Bearing Restrictions: No Other Position/Activity Restrictions: wbat      Mobility  Bed Mobility Overal bed mobility: Needs Assistance Bed Mobility: Supine to Sit     Supine to sit: Min guard, HOB elevated     General bed mobility comments: Min guard with HOB elevated, no physical assist required.    Transfers Overall transfer level: Needs assistance Equipment used: Rolling walker (2 wheels) Transfers: Sit to/from Stand Sit to Stand: Min guard, From elevated surface           General transfer comment: Min guard from elevated surface, no physical assist required, VCs for sequencing. Pt taken through lateral weightshifts to demonstrate WBAT status.    Ambulation/Gait Ambulation/Gait assistance: Min guard, +2 safety/equipment Gait Distance (Feet): 20 Feet Assistive device: Rolling walker (2 wheels) Gait Pattern/deviations: Step-to pattern Gait velocity: decreased     General Gait Details: Pt ambulated with RW and min guard, no physical assist required or overt LOB noted, +2 for reclinerfollow for safety. Pt demonstrated safe pattern and was very excited to be able to walk.  Stairs            Wheelchair Mobility    Modified Rankin (Stroke Patients Only)       Balance Overall balance assessment: Needs assistance Sitting-balance support: Feet supported, No upper extremity supported Sitting balance-Leahy Scale: Good     Standing balance support: Reliant on assistive device for balance, During functional activity, Bilateral upper extremity supported Standing balance-Leahy Scale: Poor  Pertinent Vitals/Pain Pain Assessment Pain Assessment: No/denies pain    Home Living Family/patient expects to be discharged to:: Private  residence Living Arrangements: Parent;Children Available Help at Discharge: Family;Available PRN/intermittently (Sister will be coming to care for pt.) Type of Home: House Home Access: Stairs to enter Entrance Stairs-Rails: Left;Right [employer] steps. 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 Pt has hoyer lift, hospital bed, and various walkers and canes and w/c for her mom    Prior Function Prior Level of Function : Independent/Modified Independent;Driving             Mobility Comments: IND ADLs Comments: IND, caregiver for mother.     Hand Dominance   Dominant Hand: Right    Extremity/Trunk Assessment   Upper Extremity Assessment Upper Extremity Assessment: Overall WFL for tasks assessed    Lower Extremity Assessment Lower Extremity Assessment: LLE deficits/detail;RLE deficits/detail RLE Deficits / Details: MMT ank DF/PF 5/5 RLE Sensation: WNL LLE Deficits / Details: MMT ank DF/PF 5/5 LLE Sensation: WNL    Cervical / Trunk Assessment Cervical / Trunk Assessment: Normal  Communication   Communication: No difficulties  Cognition Arousal/Alertness: Awake/alert Behavior During Therapy: WFL for tasks assessed/performed Overall Cognitive Status: Within Functional Limits for tasks assessed                                 General Comments: Pt very pleasant and positive but admits that it has been hard to care for her mother, feels excited to have a break from caregiving.        General Comments      Exercises Total Joint Exercises Ankle Circles/Pumps: AROM, Both, 10 reps    Assessment/Plan    PT Assessment Patient needs continued PT services  PT Problem List Decreased strength;Decreased range of motion;Decreased activity tolerance;Decreased balance;Decreased mobility;Pain       PT Treatment Interventions DME instruction;Gait training;Stair training;Functional mobility training;Therapeutic activities;Therapeutic exercise;Balance training;Neuromuscular re-education;Patient/family education    PT Goals (Current goals can be found in the Care Plan section)  Acute Rehab PT Goals Patient Stated Goal: To walk without pain PT Goal Formulation: With patient Time For Goal Achievement: 02/22/22 Potential to Achieve Goals: Good    Frequency 7X/week     Co-evaluation               AM-PAC PT "6 Clicks" Mobility  Outcome Measure Help needed turning from your back to your side while in a flat bed without using bedrails?: None Help needed moving from lying on your back to sitting on the side of a flat bed without using bedrails?: A Little Help needed moving to and from a bed to a chair (including a wheelchair)?: A Little Help needed standing up from a chair using your arms (e.g., wheelchair or bedside chair)?: A Little Help needed to walk in hospital room?: A Little Help needed climbing 3-5 steps with a railing? : A Little 6 Click Score: 19    End of Session Equipment Utilized During Treatment: Gait belt Activity Tolerance: Patient tolerated treatment well;No increased pain Patient left: in bed;with nursing/sitter in room Nurse Communication: Mobility status PT Visit  Diagnosis: Pain;Difficulty in walking, not elsewhere classified (R26.2) Pain - Right/Left: Left Pain - part of body: Hip    Time: 0379-5583 PT Time Calculation (min) (ACUTE ONLY): 29 min   Charges:   PT Evaluation $PT Eval Low Complexity: 1 Low PT Treatments $Gait Training: 8-22 mins        Coolidge Breeze, PT, DPT [facility] Department Office: [phone] Weekend  pager: [phone]  Coolidge Breeze 02/15/2022, 5:18 PM

## 2022-02-15 NOTE — Plan of Care (Signed)
  Problem: Pain Management: Goal: Pain level will decrease with appropriate interventions Outcome: Progressing   Problem: Activity: Goal: Risk for activity intolerance will decrease Outcome: Progressing   Problem: Nutrition: Goal: Adequate nutrition will be maintained Outcome: Progressing   

## 2022-02-15 NOTE — Interval H&P Note (Signed)
History and Physical Interval Note:  02/15/2022 9:20 AM  Jennifer Franco  has presented today for surgery, with the diagnosis of djd left hip.  The various methods of treatment have been discussed with the patient and family. After consideration of risks, benefits and other options for treatment, the patient has consented to  Procedure(s): TOTAL HIP ARTHROPLASTY (Left) as a surgical intervention.  The patient's history has been reviewed, patient examined, no change in status, stable for surgery.  I have reviewed the patient's chart and labs.  Questions were answered to the patient's satisfaction.     Johnny Bridge

## 2022-02-15 NOTE — Op Note (Signed)
02/15/2022  12:26 PM  PATIENT:  Jennifer Franco   MRN: [id number]  PRE-OPERATIVE DIAGNOSIS: Left hip severe osteoarthritis  POST-OPERATIVE DIAGNOSIS:  same  PROCEDURE:  Procedure(s): TOTAL HIP ARTHROPLASTY  PREOPERATIVE INDICATIONS:    Jennifer Franco is an 66 y.o. female who has a diagnosis of left hip osteoarthritis and elected for surgical management after failing conservative treatment.  The risks benefits and alternatives were discussed with the patient including but not limited to the risks of nonoperative treatment, versus surgical intervention including infection, bleeding, nerve injury, periprosthetic fracture, the need for revision surgery, dislocation, leg length discrepancy, blood clots, cardiopulmonary complications, morbidity, mortality, among others, and they were willing to proceed.     OPERATIVE REPORT     SURGEON:  Marchia Bond, MD    ASSISTANT:  Merlene Pulling, PA-C, (Present throughout the entire procedure,  necessary for completion of procedure in a timely manner, assisting with retraction, instrumentation, and closure)     ANESTHESIA: Spinal  ESTIMATED BLOOD LOSS: 892 mL    COMPLICATIONS:  None.     UNIQUE ASPECTS OF THE CASE: She had advanced bone loss on the acetabulum, migrating superiorly.  I did not completely medialized, but I had a good fill, and a stable cup.  Initially, the first screw was trying to diverge off into one of the subchondral cysts, which were fairly sizable, and I packed with bone graft, I redrilled and put in a 25 which felt like it grabbed good purchase beyond the cyst.  She was fairly short preoperatively, I was able to restore length, although her soft tissues were fairly contracted and did not reach the bone.  She had excellent stability.  I was visualizing a 2 mm rim of osteophyte anteriorly, and a 1 mm rim of actual bone, I felt like my version was appropriate.  COMPONENTS:  [employer] fit femur size 5  with a 36 mm + 5 ceramic head ball and a Gription Acetabular shell size 52, with a single cancellous screw for backup fixation, with an apex hole eliminator and a +4 neutral polyethylene liner.    PROCEDURE IN DETAIL:   The patient was met in the holding area and  identified.  The appropriate hip was identified and marked at the operative site.  The patient was then transported to the OR  and  placed under anesthesia.  At that point, the patient was  placed in the lateral decubitus position with the operative side up and  secured to the operating room table and all bony prominences padded.     The operative lower extremity was prepped from the iliac crest to the distal leg.  Sterile draping was performed.  Time out was performed prior to incision.      A routine posterolateral approach was utilized via sharp dissection  carried down to the subcutaneous tissue.  Gross bleeders were Bovie coagulated.  The iliotibial band was identified and incised along the length of the skin incision.  Self-retaining retractors were  inserted.  With the hip internally rotated, the short external rotators  were identified. The piriformis and capsule was tagged with Ethibond, and the hip capsule released in a T-type fashion.  The femoral neck was exposed, and I resected the femoral neck using the appropriate jig. This was performed at approximately a thumb's breadth above the lesser trochanter.    I then exposed the deep acetabulum, cleared out any tissue including the ligamentum teres.  A wing retractor was placed.  After adequate visualization, I excised the labrum, and then sequentially reamed.  I placed the trial acetabulum, which seated nicely, and then impacted the real cup into place.  Appropriate version and inclination was confirmed clinically matching their bony anatomy, and also with the use of the jig.  I placed a cancellous screw to augment fixation.  A trial polyethylene liner was placed and the wing  retractor removed.    I then prepared the proximal femur using the cookie-cutter, the lateralizing reamer, and then sequentially reamed and broached.  A trial broach, neck, and head was utilized, and I reduced the hip and it was found to have excellent stability with functional range of motion. The trial components were then removed, and the real polyethylene liner was placed.  I then impacted the real femoral prosthesis into place into the appropriate version, slightly anteverted to the normal anatomy, and I impacted the real head ball into place. The hip was then reduced and taken through functional range of motion and found to have excellent stability. Leg lengths were restored.  I then used a 2 mm drill bits to pass the Ethibond suture from the capsule and piriformis through the greater trochanter, and secured this. Excellent posterior capsular repair was achieved. I also closed the T in the capsule.  I then irrigated the hip copiously again with pulse lavage, and repaired the fascia with Vicryl, followed by Vicryl for the subcutaneous tissue, Monocryl for the skin, Steri-Strips and sterile gauze. The wounds were injected. The patient was then awakened and returned to PACU in stable and satisfactory condition. There were no complications.  Marchia Bond, MD Orthopedic Surgeon [phone]   02/15/2022 12:26 PM

## 2022-02-15 NOTE — Transfer of Care (Signed)
Immediate Anesthesia Transfer of Care Note  Patient: Jennifer Franco  Procedure(s) Performed: TOTAL HIP ARTHROPLASTY (Left: Hip)  Patient Location: PACU  Anesthesia Type:Spinal and MAC combined with regional for post-op pain  Level of Consciousness: awake, alert , oriented and patient cooperative  Airway & Oxygen Therapy: Patient Spontanous Breathing  Post-op Assessment: Report given to RN and Post -op Vital signs reviewed and stable  Post vital signs: Reviewed and stable  Last Vitals:  Vitals Value Taken Time  BP 104/74 02/15/22 1257  Temp    Pulse 52 02/15/22 1300  Resp 21 02/15/22 1300  SpO2 99 % 02/15/22 1300  Vitals shown include unvalidated device data.  Last Pain:  Vitals:   02/15/22 0848  TempSrc:   PainSc: 0-No pain      Patients Stated Pain Goal: 5 (56/72/09 1980)  Complications: No notable events documented.

## 2022-02-16 ENCOUNTER — Encounter (HOSPITAL_COMMUNITY): Payer: Self-pay | Admitting: Orthopedic Surgery

## 2022-02-16 DIAGNOSIS — F32A Depression, unspecified: Secondary | ICD-10-CM | POA: Diagnosis not present

## 2022-02-16 DIAGNOSIS — F419 Anxiety disorder, unspecified: Secondary | ICD-10-CM | POA: Diagnosis not present

## 2022-02-16 DIAGNOSIS — M069 Rheumatoid arthritis, unspecified: Secondary | ICD-10-CM | POA: Diagnosis not present

## 2022-02-16 DIAGNOSIS — M3500 Sicca syndrome, unspecified: Secondary | ICD-10-CM | POA: Diagnosis not present

## 2022-02-16 DIAGNOSIS — E039 Hypothyroidism, unspecified: Secondary | ICD-10-CM | POA: Diagnosis not present

## 2022-02-16 DIAGNOSIS — Z87891 Personal history of nicotine dependence: Secondary | ICD-10-CM | POA: Diagnosis not present

## 2022-02-16 DIAGNOSIS — Z8673 Personal history of transient ischemic attack (TIA), and cerebral infarction without residual deficits: Secondary | ICD-10-CM | POA: Diagnosis not present

## 2022-02-16 DIAGNOSIS — M1612 Unilateral primary osteoarthritis, left hip: Secondary | ICD-10-CM | POA: Diagnosis not present

## 2022-02-16 DIAGNOSIS — M797 Fibromyalgia: Secondary | ICD-10-CM | POA: Diagnosis not present

## 2022-02-16 LAB — CBC
HCT: 32.8 % — ABNORMAL LOW (ref 36.0–46.0)
Hemoglobin: 11 g/dL — ABNORMAL LOW (ref 12.0–15.0)
MCH: 29.4 pg (ref 26.0–34.0)
MCHC: 33.5 g/dL (ref 30.0–36.0)
MCV: 87.7 fL (ref 80.0–100.0)
Platelets: 215 10*3/uL (ref 150–400)
RBC: 3.74 MIL/uL — ABNORMAL LOW (ref 3.87–5.11)
RDW: 14 % (ref 11.5–15.5)
WBC: 8.5 10*3/uL (ref 4.0–10.5)
nRBC: 0 % (ref 0.0–0.2)

## 2022-02-16 LAB — BASIC METABOLIC PANEL
Anion gap: 5 (ref 5–15)
BUN: 16 mg/dL (ref 8–23)
CO2: 23 mmol/L (ref 22–32)
Calcium: 8.7 mg/dL — ABNORMAL LOW (ref 8.9–10.3)
Chloride: 109 mmol/L (ref 98–111)
Creatinine, Ser: 0.54 mg/dL (ref 0.44–1.00)
GFR, Estimated: >60 mL/min (ref >60)
Glucose, Bld: 114 mg/dL — ABNORMAL HIGH (ref 70–99)
Potassium: 3.2 mmol/L — ABNORMAL LOW (ref 3.5–5.1)
Sodium: 137 mmol/L (ref 135–145)

## 2022-02-16 MED ORDER — SENNA-DOCUSATE SODIUM 8.6-50 MG PO TABS: 2.0000 | ORAL_TABLET | Freq: Every day | ORAL | 1 refills | Status: AC

## 2022-02-16 MED ORDER — HYDROCODONE-ACETAMINOPHEN 10-325 MG PO TABS: 1.0000 | ORAL_TABLET | Freq: Four times a day (QID) | ORAL | 0 refills | Status: DC | PRN

## 2022-02-16 MED ORDER — ONDANSETRON HCL 4 MG PO TABS: 4.0000 mg | ORAL_TABLET | Freq: Three times a day (TID) | ORAL | 0 refills | Status: AC | PRN

## 2022-02-16 MED ORDER — POTASSIUM CHLORIDE 20 MEQ PO PACK
20.0000 meq | PACK | Freq: Once | ORAL | Status: AC
  Administered 2022-02-16: 20 meq via ORAL
  Filled 2022-02-16: qty 1

## 2022-02-16 MED ORDER — BACLOFEN 10 MG PO TABS: 10.0000 mg | ORAL_TABLET | Freq: Three times a day (TID) | ORAL | 0 refills | Status: AC

## 2022-02-16 MED ORDER — SODIUM CHLORIDE 0.9 % IV BOLUS
500.0000 mL | Freq: Once | INTRAVENOUS | Status: AC
  Administered 2022-02-16: 500 mL via INTRAVENOUS

## 2022-02-16 MED ORDER — RIVAROXABAN 10 MG PO TABS: 10.0000 mg | ORAL_TABLET | Freq: Every day | ORAL | 0 refills | Status: DC

## 2022-02-16 NOTE — Progress Notes (Signed)
Physical Therapy Treatment Patient Details Name: Jennifer Franco MRN: [id number] DOB: 1956/02/26 Today's Date: 02/16/2022   History of Present Illness Pt is a 66yo female presenting s/p L-THA posterior approach on 02/15/22. PMH: angina, anemia, hx of bladder cancer, anxiety & depression, fibromyalgia, hypothyroidism, RA, Sjogren's syndrome, hx of TIA 2022-2023    PT Comments    Pt ambulated in hallway and performed LE exercises.  Pt to practice stairs next session and hopefully d/c home later today.   Recommendations for follow up therapy are one component of a multi-disciplinary discharge planning process, led by the attending physician.  Recommendations may be updated based on patient status, additional functional criteria and insurance authorization.  Follow Up Recommendations  Follow physician's recommendations for discharge plan and follow up therapies     Assistance Recommended at Discharge Frequent or constant Supervision/Assistance  Patient can return home with the following A little help with walking and/or transfers;A little help with bathing/dressing/bathroom;Assistance with cooking/housework;Assist for transportation;Help with stairs or ramp for entrance   Equipment Recommendations  None recommended by PT    Recommendations for Other Services       Precautions / Restrictions Precautions Precautions: Fall Restrictions Weight Bearing Restrictions: No     Mobility  Bed Mobility Overal bed mobility: Needs Assistance Bed Mobility: Supine to Sit, Sit to Supine     Supine to sit: Min guard, HOB elevated Sit to supine: Min guard, HOB elevated        Transfers Overall transfer level: Needs assistance Equipment used: Rolling walker (2 wheels) Transfers: Sit to/from Stand Sit to Stand: Min guard           General transfer comment: verbal cues for UE and LE positioning    Ambulation/Gait Ambulation/Gait assistance: Min guard Gait Distance (Feet): 50  Feet Assistive device: Rolling walker (2 wheels) Gait Pattern/deviations: Step-to pattern, Antalgic Gait velocity: decreased     General Gait Details: verbal cues for sequence, RW positioning, posture   Stairs             Wheelchair Mobility    Modified Rankin (Stroke Patients Only)       Balance                                            Cognition Arousal/Alertness: Awake/alert Behavior During Therapy: WFL for tasks assessed/performed Overall Cognitive Status: Within Functional Limits for tasks assessed                                          Exercises Total Joint Exercises Ankle Circles/Pumps: AROM, Both, 10 reps Quad Sets: AROM, Both, 10 reps Heel Slides: AAROM, Left, 10 reps Hip ABduction/ADduction: AAROM, Left, 10 reps Long Arc Quad: AROM, Left, Seated, 10 reps    General Comments        Pertinent Vitals/Pain Pain Assessment Pain Assessment: 0-10 Pain Score: 3  Pain Location: right hip Pain Descriptors / Indicators: Sore, Aching Pain Intervention(s): Monitored during session, Repositioned, Premedicated before session    Home Living                          Prior Function            PT Goals (current goals can now be found  in the care plan section) Progress towards PT goals: Progressing toward goals    Frequency    7X/week      PT Plan Current plan remains appropriate    Co-evaluation              AM-PAC PT "6 Clicks" Mobility   Outcome Measure  Help needed turning from your back to your side while in a flat bed without using bedrails?: None Help needed moving from lying on your back to sitting on the side of a flat bed without using bedrails?: A Little Help needed moving to and from a bed to a chair (including a wheelchair)?: A Little Help needed standing up from a chair using your arms (e.g., wheelchair or bedside chair)?: A Little Help needed to walk in hospital room?: A  Little Help needed climbing 3-5 steps with a railing? : A Little 6 Click Score: 19    End of Session Equipment Utilized During Treatment: Gait belt Activity Tolerance: Patient tolerated treatment well;No increased pain Patient left: in bed;with call bell/phone within reach   PT Visit Diagnosis: Pain;Difficulty in walking, not elsewhere classified (R26.2) Pain - Right/Left: Left Pain - part of body: Hip     Time: 0921-0949 PT Time Calculation (min) (ACUTE ONLY): 28 min  Charges:  $Gait Training: 8-22 mins $Therapeutic Exercise: 8-22 mins                    Jannette Spanner PT, DPT Physical Therapist Acute Rehabilitation Services Preferred contact method: Secure Chat Weekend Pager Only: [phone] Office: [place] 02/16/2022, 12:12 PM

## 2022-02-16 NOTE — Progress Notes (Signed)
Provided discharge education and instructions. Questions and concerns addressed. Patient is stable. Discharge home accompany with daughter.

## 2022-02-16 NOTE — Progress Notes (Signed)
Physical Therapy Treatment Patient Details Name: Jennifer Franco MRN: [id number] DOB: 09-Apr-1956 Today's Date: 02/16/2022   History of Present Illness Pt is a 66yo female presenting s/p L-THA posterior approach on 02/15/22. PMH: angina, anemia, hx of bladder cancer, anxiety & depression, fibromyalgia, hypothyroidism, RA, Sjogren's syndrome, hx of TIA 2022-2023    PT Comments    Daughter present and observed session.  Pt ambulated good distance and practiced safe stair technique.  Pt had no further questions and ready for d/c home from mobility standpoint.  Pt plans to f/u with HHPT upon d/c.    Recommendations for follow up therapy are one component of a multi-disciplinary discharge planning process, led by the attending physician.  Recommendations may be updated based on patient status, additional functional criteria and insurance authorization.  Follow Up Recommendations  Follow physician's recommendations for discharge plan and follow up therapies     Assistance Recommended at Discharge Frequent or constant Supervision/Assistance  Patient can return home with the following A little help with walking and/or transfers;A little help with bathing/dressing/bathroom;Assistance with cooking/housework;Assist for transportation;Help with stairs or ramp for entrance   Equipment Recommendations  None recommended by PT    Recommendations for Other Services       Precautions / Restrictions Precautions Precautions: Fall Restrictions Weight Bearing Restrictions: No     Mobility  Bed Mobility Overal bed mobility: Needs Assistance Bed Mobility: Supine to Sit, Sit to Supine     Supine to sit: Supervision Sit to supine: Supervision        Transfers Overall transfer level: Needs assistance Equipment used: Rolling walker (2 wheels) Transfers: Sit to/from Stand Sit to Stand: Min guard, Supervision           General transfer comment: verbal cues for UE and LE positioning     Ambulation/Gait Ambulation/Gait assistance: Supervision, Min guard Gait Distance (Feet): 360 Feet Assistive device: Rolling walker (2 wheels) Gait Pattern/deviations: Step-to pattern, Antalgic Gait velocity: decreased     General Gait Details: verbal cues for sequence, RW positioning, posture (reports improved stiffness and pain with movement so wanted to perform longer distance)   Stairs Stairs: Yes Stairs assistance: Min guard Stair Management: Step to pattern, Forwards, One rail Left Number of Stairs: 2 General stair comments: verbal cues for sequence and safety, performed once with bil rails and then twice with one rail as above; daughter present and observed   Wheelchair Mobility    Modified Rankin (Stroke Patients Only)       Balance                                            Cognition Arousal/Alertness: Awake/alert Behavior During Therapy: WFL for tasks assessed/performed Overall Cognitive Status: Within Functional Limits for tasks assessed                                          Exercises     General Comments        Pertinent Vitals/Pain Pain Assessment Pain Assessment: 0-10 Pain Score: 2  Pain Location: right hip Pain Descriptors / Indicators: Sore, Aching Pain Intervention(s): Repositioned, Monitored during session    Home Living  Prior Function            PT Goals (current goals can now be found in the care plan section) Progress towards PT goals: Progressing toward goals    Frequency    7X/week      PT Plan Current plan remains appropriate    Co-evaluation              AM-PAC PT "6 Clicks" Mobility   Outcome Measure  Help needed turning from your back to your side while in a flat bed without using bedrails?: None Help needed moving from lying on your back to sitting on the side of a flat bed without using bedrails?: A Little Help needed moving to  and from a bed to a chair (including a wheelchair)?: A Little Help needed standing up from a chair using your arms (e.g., wheelchair or bedside chair)?: A Little Help needed to walk in hospital room?: A Little Help needed climbing 3-5 steps with a railing? : A Little 6 Click Score: 19    End of Session Equipment Utilized During Treatment: Gait belt Activity Tolerance: Patient tolerated treatment well;No increased pain Patient left: in bed;with call bell/phone within reach;with family/visitor present   PT Visit Diagnosis: Difficulty in walking, not elsewhere classified (R26.2) Pain - Right/Left: Left Pain - part of body: Hip     Time: 3810-1751 PT Time Calculation (min) (ACUTE ONLY): 28 min  Charges:  $Gait Training: 23-37 mins                     Jannette Spanner PT, DPT Physical Therapist Acute Rehabilitation Services Preferred contact method: Secure Chat Weekend Pager Only: [phone] Office: [place] 02/16/2022, 3:56 PM

## 2022-02-16 NOTE — TOC Transition Note (Signed)
Transition of Care [facility]) - CM/SW Discharge Note   Patient Details  Name: Jennifer Franco MRN: [id number] Date of Birth: Mar 08, 1956  Transition of Care [facility]) CM/SW Contact:  Lennart Pall, LCSW Phone Number: 02/16/2022, 10:43 AM   Clinical Narrative:    Met with pt and confirming she has all needed DME at home.  HHPT prearrnaged with [facility].  NO further TOC needs.   Final next level of care: [place] Barriers to Discharge: No Barriers Identified   Patient Goals and CMS Choice Patient states their goals for this hospitalization and ongoing recovery are:: return home      Discharge Placement                       Discharge Plan and Services                DME Arranged: N/A DME Agency: NA       HH Arranged: PT [place] Agency: [place]        Social Determinants of Health (SDOH) Interventions     Readmission Risk Interventions     No data to display

## 2022-02-16 NOTE — Progress Notes (Signed)
Subjective: 1 Day Post-Op s/p Procedure(s): TOTAL HIP ARTHROPLASTY   Patient is alert, oriented, sitting up in bed. Has not yet voided since removal of her foley, but does feel the need to go. Reports pain as so far well controlled. No bowel movement yet. Denies chest pain, SOB, Calf pain. No nausea/vomiting. No other complaints.     Objective:  PE: VITALS:   Vitals:   02/15/22 2022 02/16/22 0047 02/16/22 0430 02/16/22 0813  BP: (!) 114/57 121/69 108/69 132/82  Pulse: 60 (!) 55 (!) 49 (!) 55  Resp: '14 15 16 15  '$ Temp: 98.7 F (37.1 C) 98.9 F (37.2 C) 99 F (37.2 C) 98.2 F (36.8 C)  TempSrc: Oral Oral Oral Axillary  SpO2: 100% 100% 100% 100%  Weight:      Height:       General: sitting up in bed, in no acute distress Resp: normal respiratory effort MSK: EHL and FHL intact. 2+ DP pulse. Aquacel with mild drainage, this was changed this morning. Distal sensation intact.   LABS  Results for orders placed or performed during the hospital encounter of 02/15/22 (from the past 24 hour(s))  CBC     Status: Abnormal   Collection Time: 02/16/22  3:24 AM  Result Value Ref Range   WBC 8.5 4.0 - 10.5 K/uL   RBC 3.74 (L) 3.87 - 5.11 MIL/uL   Hemoglobin 11.0 (L) 12.0 - 15.0 g/dL   HCT 32.8 (L) 36.0 - 46.0 %   MCV 87.7 80.0 - 100.0 fL   MCH 29.4 26.0 - 34.0 pg   MCHC 33.5 30.0 - 36.0 g/dL   RDW 14.0 11.5 - 15.5 %   Platelets 215 150 - 400 K/uL   nRBC 0.0 0.0 - 0.2 %  Basic metabolic panel     Status: Abnormal   Collection Time: 02/16/22  3:24 AM  Result Value Ref Range   Sodium 137 135 - 145 mmol/L   Potassium 3.2 (L) 3.5 - 5.1 mmol/L   Chloride 109 98 - 111 mmol/L   CO2 23 22 - 32 mmol/L   Glucose, Bld 114 (H) 70 - 99 mg/dL   BUN 16 8 - 23 mg/dL   Creatinine, Ser 0.54 0.44 - 1.00 mg/dL   Calcium 8.7 (L) 8.9 - 10.3 mg/dL   GFR, Estimated >60 >60 mL/min   Anion gap 5 5 - 15    DG Hip Port Unilat With Pelvis 1V Left  Result Date: 02/15/2022 CLINICAL DATA:  Status  post left hip arthroplasty. EXAM: DG HIP (WITH OR WITHOUT PELVIS) 1V PORT LEFT COMPARISON:  None Available. FINDINGS: Status post left hip arthroplasty with intact hardware. No perihardware loosening. Subcutaneous emphysema as expected. IMPRESSION: Status post left hip arthroplasty with intact hardware. Postsurgical changes as expected. Electronically Signed   By: Keane Police D.O.   On: 02/15/2022 13:41    Assessment/Plan: Principal Problem:   S/P total left hip arthroplasty  1 Day Post-Op s/p Procedure(s): TOTAL HIP ARTHROPLASTY - potassium a little low this morning, I will replete this morning, pressures also trending a little low, will give fluid bolus this morning  Weightbearing: WBAT LLE Insicional and dressing care: Dressings left intact until follow-up, reinforce as needed VTE prophylaxis: Xarelto 10 mg daily x 30 days Pain control: continue current regimen, pain so far well controlled Follow - up plan: 2 weeks with Dr. Mardelle Matte Dispo: pending progress with PT today, likely discharge home this afternoon if able to pass stair training  Contact information:   Merlene Pulling, Hershal Coria VBTYOMAY 8-5  After hours and holidays please check [email or web address] for group call information for [facility]  Ventura Bruns 02/16/2022, 8:35 AM

## 2022-02-16 NOTE — Plan of Care (Signed)
  Problem: Education: Goal: Knowledge of the prescribed therapeutic regimen will improve Outcome: Progressing Goal: Understanding of discharge needs will improve Outcome: Progressing Goal: Individualized Educational Video(s) Outcome: Progressing   Problem: Activity: Goal: Ability to avoid complications of mobility impairment will improve Outcome: Progressing Goal: Ability to tolerate increased activity will improve Outcome: Progressing   

## 2022-02-28 ENCOUNTER — Ambulatory Visit (INDEPENDENT_AMBULATORY_CARE_PROVIDER_SITE_OTHER): Payer: Medicare PPO

## 2022-02-28 DIAGNOSIS — M1612 Unilateral primary osteoarthritis, left hip: Secondary | ICD-10-CM | POA: Diagnosis not present

## 2022-02-28 DIAGNOSIS — B351 Tinea unguium: Secondary | ICD-10-CM

## 2022-02-28 NOTE — Progress Notes (Signed)
Patient presents today for the 5th laser treatment. Diagnosed with mycotic nail infection by Dr. Elisha Ponder .    Toenail most affected 1-3 right .   All other systems are negative.   Nails were cut and filed thin. Laser therapy was administered to 1-3 toenails on right foot and patient tolerated the treatment well. All safety precautions were in place.      Follow up in 8 weeks for laser # 6.

## 2022-03-04 DIAGNOSIS — M1612 Unilateral primary osteoarthritis, left hip: Secondary | ICD-10-CM | POA: Diagnosis not present

## 2022-03-04 DIAGNOSIS — R269 Unspecified abnormalities of gait and mobility: Secondary | ICD-10-CM | POA: Diagnosis not present

## 2022-03-04 DIAGNOSIS — M6281 Muscle weakness (generalized): Secondary | ICD-10-CM | POA: Diagnosis not present

## 2022-03-04 DIAGNOSIS — M25652 Stiffness of left hip, not elsewhere classified: Secondary | ICD-10-CM | POA: Diagnosis not present

## 2022-03-08 ENCOUNTER — Other Ambulatory Visit: Payer: Medicare PPO

## 2022-03-10 DIAGNOSIS — M25652 Stiffness of left hip, not elsewhere classified: Secondary | ICD-10-CM | POA: Diagnosis not present

## 2022-03-10 DIAGNOSIS — R269 Unspecified abnormalities of gait and mobility: Secondary | ICD-10-CM | POA: Diagnosis not present

## 2022-03-10 DIAGNOSIS — M6281 Muscle weakness (generalized): Secondary | ICD-10-CM | POA: Diagnosis not present

## 2022-03-10 DIAGNOSIS — M1612 Unilateral primary osteoarthritis, left hip: Secondary | ICD-10-CM | POA: Diagnosis not present

## 2022-03-14 DIAGNOSIS — M160 Bilateral primary osteoarthritis of hip: Secondary | ICD-10-CM | POA: Diagnosis not present

## 2022-03-15 ENCOUNTER — Telehealth: Payer: Self-pay

## 2022-03-15 DIAGNOSIS — M25652 Stiffness of left hip, not elsewhere classified: Secondary | ICD-10-CM | POA: Diagnosis not present

## 2022-03-15 DIAGNOSIS — M6281 Muscle weakness (generalized): Secondary | ICD-10-CM | POA: Diagnosis not present

## 2022-03-15 DIAGNOSIS — R269 Unspecified abnormalities of gait and mobility: Secondary | ICD-10-CM | POA: Diagnosis not present

## 2022-03-15 DIAGNOSIS — M1612 Unilateral primary osteoarthritis, left hip: Secondary | ICD-10-CM | POA: Diagnosis not present

## 2022-03-15 NOTE — Telephone Encounter (Signed)
Surgical clearance from [facility] for R total hip replacement placed on MD desk for review.

## 2022-03-18 DIAGNOSIS — M25652 Stiffness of left hip, not elsewhere classified: Secondary | ICD-10-CM | POA: Diagnosis not present

## 2022-03-18 DIAGNOSIS — M1612 Unilateral primary osteoarthritis, left hip: Secondary | ICD-10-CM | POA: Diagnosis not present

## 2022-03-18 DIAGNOSIS — R269 Unspecified abnormalities of gait and mobility: Secondary | ICD-10-CM | POA: Diagnosis not present

## 2022-03-18 DIAGNOSIS — M6281 Muscle weakness (generalized): Secondary | ICD-10-CM | POA: Diagnosis not present

## 2022-03-22 DIAGNOSIS — R269 Unspecified abnormalities of gait and mobility: Secondary | ICD-10-CM | POA: Diagnosis not present

## 2022-03-22 DIAGNOSIS — M25652 Stiffness of left hip, not elsewhere classified: Secondary | ICD-10-CM | POA: Diagnosis not present

## 2022-03-22 DIAGNOSIS — M1612 Unilateral primary osteoarthritis, left hip: Secondary | ICD-10-CM | POA: Diagnosis not present

## 2022-03-22 DIAGNOSIS — M6281 Muscle weakness (generalized): Secondary | ICD-10-CM | POA: Diagnosis not present

## 2022-03-24 NOTE — Telephone Encounter (Signed)
I have faxed TE note to [facility]on Anna @ [phone] with MD clearance note attached. Advised if MD note isn't sufficient, to refax clearance to [place].  Confirmation received.

## 2022-03-24 NOTE — Telephone Encounter (Signed)
Clearance is not in MD office/POD 2. I have attempted to contact [facility] to verify receipt and unable to reach someone. Will try again later.

## 2022-03-28 DIAGNOSIS — M25652 Stiffness of left hip, not elsewhere classified: Secondary | ICD-10-CM | POA: Diagnosis not present

## 2022-03-28 DIAGNOSIS — M1612 Unilateral primary osteoarthritis, left hip: Secondary | ICD-10-CM | POA: Diagnosis not present

## 2022-03-28 DIAGNOSIS — R269 Unspecified abnormalities of gait and mobility: Secondary | ICD-10-CM | POA: Diagnosis not present

## 2022-03-28 DIAGNOSIS — M6281 Muscle weakness (generalized): Secondary | ICD-10-CM | POA: Diagnosis not present

## 2022-03-30 DIAGNOSIS — M25652 Stiffness of left hip, not elsewhere classified: Secondary | ICD-10-CM | POA: Diagnosis not present

## 2022-03-30 DIAGNOSIS — M6281 Muscle weakness (generalized): Secondary | ICD-10-CM | POA: Diagnosis not present

## 2022-03-30 DIAGNOSIS — M1612 Unilateral primary osteoarthritis, left hip: Secondary | ICD-10-CM | POA: Diagnosis not present

## 2022-03-30 DIAGNOSIS — R269 Unspecified abnormalities of gait and mobility: Secondary | ICD-10-CM | POA: Diagnosis not present

## 2022-04-04 NOTE — Progress Notes (Signed)
Surgical orders requested via Epic inbox.

## 2022-04-05 DIAGNOSIS — M25652 Stiffness of left hip, not elsewhere classified: Secondary | ICD-10-CM | POA: Diagnosis not present

## 2022-04-05 DIAGNOSIS — R269 Unspecified abnormalities of gait and mobility: Secondary | ICD-10-CM | POA: Diagnosis not present

## 2022-04-05 DIAGNOSIS — M6281 Muscle weakness (generalized): Secondary | ICD-10-CM | POA: Diagnosis not present

## 2022-04-05 DIAGNOSIS — M1612 Unilateral primary osteoarthritis, left hip: Secondary | ICD-10-CM | POA: Diagnosis not present

## 2022-04-07 NOTE — Progress Notes (Addendum)
COVID Vaccine received:  _0  No _1  Yes Date of any COVID positive Test in last 90 days:  PCP - Collene Leyden, MD at [place] - none Neurology:  Antony Contras, MD  Chest x-ray - 07-17-2021  2v EKG -  07-19-2021 Stress Test -  ECHO - 03-31-2021  Bubble study Cardiac Cath -   PCR screen: _2  Ordered & Completed                      _3   No Order but Needs PROFEND                      _4   N/A for this surgery  Surgery Plan:  _5  Ambulatory                            _6  Outpatient in bed                            _7  Admit  Anesthesia:    _8  General  _9  Spinal  Epidural                         _10   Choice _11   MAC  Pacemaker / ICD device _12  No _13  Yes        Device order form faxed _14  No    _15   Yes      Faxed to:  History of Sleep Apnea? _16  No _17  Yes   CPAP used?- _18  No _19  Yes    Does the patient monitor blood sugar? _20  No _21  Yes  _22  N/A  Blood Thinner / Instructions: Xarelto ? Patient states that she stopped 2 weeks ago, Patient had Left THA on 02-15-22 and says that she has taken it sporadically since. Patient says that she has "amnesia" sometimes and she can not remember. I instructed her to call her MD to get guidance on when to stop.  Aspirin Instructions: none  ERAS Protocol Ordered: _23  No  _24  Yes PRE-SURGERY _25  ENSURE  _26  G2  Patient is to be NPO after: 04:30 am  Comments:  Need to verify the approach to be used for this THA, to decide if patient needs T&S.  I could not get confirmation of approach so this patient did not get a T&S drawn today at PST appt.   Activity level: : Can go up a flight of stairs and perform activities of daily living without stopping and without symptoms of chest pain or shortness of breath.                        Anesthesia review: TIAs, RA, Anxiety, Hx Bladder Cancer, Sjogren's, Pre-DM , Amnesia per patient  Patient denies shortness of breath, fever, cough and chest pain at PAT appointment.  Patient verbalized understanding and  agreement to the Pre-Surgical Instructions that were given to them at this PAT appointment. Patient was also educated of the need to review these PAT instructions again prior to his/her surgery.I reviewed the appropriate phone numbers to call if they have any and questions or concerns.

## 2022-04-08 ENCOUNTER — Encounter (HOSPITAL_COMMUNITY): Payer: Self-pay

## 2022-04-08 ENCOUNTER — Other Ambulatory Visit: Payer: Self-pay

## 2022-04-08 ENCOUNTER — Encounter (HOSPITAL_COMMUNITY)
Admission: RE | Admit: 2022-04-08 | Discharge: 2022-04-08 | Disposition: A | Discharge status: Home / Self Care | Payer: Medicare PPO | Source: Ambulatory Visit | Attending: Orthopedic Surgery | Admitting: Orthopedic Surgery

## 2022-04-08 VITALS — BP 110/72 | HR 55 | Temp 100.0°F | Resp 14 | Ht 64.0 in | Wt 133.0 lb

## 2022-04-08 DIAGNOSIS — Z01818 Encounter for other preprocedural examination: Secondary | ICD-10-CM

## 2022-04-08 DIAGNOSIS — R7303 Prediabetes: Secondary | ICD-10-CM | POA: Diagnosis not present

## 2022-04-08 DIAGNOSIS — Z01812 Encounter for preprocedural laboratory examination: Secondary | ICD-10-CM | POA: Insufficient documentation

## 2022-04-08 LAB — GLUCOSE, CAPILLARY: Glucose-Capillary: 70 mg/dL (ref 70–99)

## 2022-04-08 LAB — CBC
HCT: 37.1 % (ref 36.0–46.0)
Hemoglobin: 11.8 g/dL — ABNORMAL LOW (ref 12.0–15.0)
MCH: 28.9 pg (ref 26.0–34.0)
MCHC: 31.8 g/dL (ref 30.0–36.0)
MCV: 90.9 fL (ref 80.0–100.0)
Platelets: 272 10*3/uL (ref 150–400)
RBC: 4.08 MIL/uL (ref 3.87–5.11)
RDW: 14.9 % (ref 11.5–15.5)
WBC: 4.8 10*3/uL (ref 4.0–10.5)
nRBC: 0 % (ref 0.0–0.2)

## 2022-04-08 LAB — BASIC METABOLIC PANEL
Anion gap: 9 (ref 5–15)
BUN: 9 mg/dL (ref 8–23)
CO2: 27 mmol/L (ref 22–32)
Calcium: 9.1 mg/dL (ref 8.9–10.3)
Chloride: 104 mmol/L (ref 98–111)
Creatinine, Ser: 0.7 mg/dL (ref 0.44–1.00)
GFR, Estimated: >60 mL/min (ref >60)
Glucose, Bld: 98 mg/dL (ref 70–99)
Potassium: 4 mmol/L (ref 3.5–5.1)
Sodium: 140 mmol/L (ref 135–145)

## 2022-04-08 LAB — SURGICAL PCR SCREEN
MRSA, PCR: NEGATIVE
Staphylococcus aureus: NEGATIVE

## 2022-04-08 NOTE — Patient Instructions (Signed)
SURGICAL WAITING ROOM VISITATION Patients having surgery or a procedure may have no more than 2 support people in the waiting area - these visitors may rotate in the visitor waiting room.   Children under the age of 50 must have an adult with them who is not the patient. If the patient needs to stay at the hospital during part of their recovery, the visitor guidelines for inpatient rooms apply.  PRE-OP VISITATION  Pre-op nurse will coordinate an appropriate time for 1 support person to accompany the patient in pre-op.  This support person may not rotate.  This visitor will be contacted when the time is appropriate for the visitor to come back in the pre-op area.  Please refer to the [facility] website for the visitor guidelines for Inpatients (after your surgery is over and you are in a regular room).  You are not required to quarantine at this time prior to your surgery. However, you must do this: Hand Hygiene often Do NOT share personal items Notify your provider if you are in close contact with someone who has COVID or you develop fever 100.4 or greater, new onset of sneezing, cough, sore throat, shortness of breath or body aches.  If you test positive for Covid or have been in contact with anyone that has tested positive in the last 10 days please notify you surgeon.    Your procedure is scheduled on:  April 19, 2022  Report to [facility] Main Entrance: [place] entrance where the [employer] is available.   Report to admitting at: 05:15    AM  +++++Call this number if you have any questions or problems the morning of surgery [phone]  Do not eat food after Midnight the night prior to your surgery/procedure.  After Midnight you may have the following liquids until  04:30  AM DAY OF SURGERY  Clear Liquid Diet Water Black Coffee (sugar ok, NO MILK/CREAM OR CREAMERS)  Tea (sugar ok, NO MILK/CREAM OR CREAMERS) regular and decaf                             Plain  Jell-O  with no fruit (NO RED)                                           Fruit ices (not with fruit pulp, NO RED)                                     Popsicles (NO RED)                                                                  Juice: apple, WHITE grape, WHITE cranberry Sports drinks like Gatorade or Powerade (NO RED)                    The day of surgery:  Drink ONE (1) Pre-Surgery G2 at  04;30 AM the morning of surgery. Drink in one sitting. Do not sip.  This drink was given to  you during your hospital pre-op appointment visit. Nothing else to drink after completing the Pre-Surgery Clear Ensure or G2 : No candy, chewing gum or throat lozenges.    FOLLOW  ANY ADDITIONAL PRE OP INSTRUCTIONS YOU RECEIVED FROM YOUR SURGEON'S OFFICE!!!   Oral Hygiene is also important to reduce your risk of infection.        Remember - BRUSH YOUR TEETH THE MORNING OF SURGERY WITH YOUR REGULAR TOOTHPASTE  Take ONLY these medicines the morning of surgery with A SIP OF WATER: Levothyroxine (Synthroid), Sertraline (Zoloft)                    You may not have any metal on your body including hair pins, jewelry, and body piercing  Do not wear make-up, lotions, powders, perfumes or deodorant  Do not wear nail polish including gel and S&S, artificial / acrylic nails, or any other type of covering on natural nails including finger and toenails. If you have artificial nails, gel coating, etc., that needs to be removed by a nail salon, Please have this removed prior to surgery. Not doing so may mean that your surgery could be cancelled or delayed if the Surgeon or anesthesia staff feels like they are unable to monitor you safely.   Do not shave 48 hours prior to surgery to avoid nicks in your skin which may contribute to postoperative infections.   You may bring a small overnight bag with you on the day of surgery, only pack items that are not valuable .[facility] IS NOT RESPONSIBLE   FOR VALUABLES THAT ARE  LOST OR STOLEN.   Do not bring your home medications to the hospital. The Pharmacy will dispense medications listed on your medication list to you during your admission in the Hospital.  Special Instructions: Bring a copy of your healthcare power of attorney and living will documents the day of surgery, if you wish to have them scanned into your [facility] Records- EPIC  Please read over the following fact sheets you were given: IF YOU HAVE QUESTIONS ABOUT YOUR PRE-OP INSTRUCTIONS, PLEASE CALL [phone]  ([place])   [place] - Preparing for Surgery Before surgery, you can play an important role.  Because skin is not sterile, your skin needs to be as free of germs as possible.  You can reduce the number of germs on your skin by washing with CHG (chlorahexidine gluconate) soap before surgery.  CHG is an antiseptic cleaner which kills germs and bonds with the skin to continue killing germs even after washing. Please DO NOT use if you have an allergy to CHG or antibacterial soaps.  If your skin becomes reddened/irritated stop using the CHG and inform your nurse when you arrive at Short Stay. Do not shave (including legs and underarms) for at least 48 hours prior to the first CHG shower.  You may shave your face/neck.  Please follow these instructions carefully:  1.  Shower with CHG Soap the night before surgery and the  morning of surgery.  2.  If you choose to wash your hair, wash your hair first as usual with your normal  shampoo.  3.  After you shampoo, rinse your hair and body thoroughly to remove the shampoo.                             4.  Use CHG as you would any other liquid soap.  You can apply chg  directly to the skin and wash.  Gently with a scrungie or clean washcloth.  5.  Apply the CHG Soap to your body ONLY FROM THE NECK DOWN.   Do not use on face/ open                           Wound or open sores. Avoid contact with eyes, ears mouth and genitals (private parts).                        Wash face,  Genitals (private parts) with your normal soap.             6.  Wash thoroughly, paying special attention to the area where your  surgery  will be performed.  7.  Thoroughly rinse your body with warm water from the neck down.  8.  DO NOT shower/wash with your normal soap after using and rinsing off the CHG Soap.            9.  Pat yourself dry with a clean towel.            10.  Wear clean pajamas.            11.  Place clean sheets on your bed the night of your first shower and do not  sleep with pets.  ON THE DAY OF SURGERY : Do not apply any lotions/deodorants the morning of surgery.  Please wear clean clothes to the hospital/surgery center.    FAILURE TO FOLLOW THESE INSTRUCTIONS MAY RESULT IN THE CANCELLATION OF YOUR SURGERY  PATIENT SIGNATURE_________________________________  NURSE SIGNATURE__________________________________  ________________________________________________________________________       Adam Phenix    An incentive spirometer is a tool that can help keep your lungs clear and active. This tool measures how well you are filling your lungs with each breath. Taking long deep breaths may help reverse or decrease the chance of developing breathing (pulmonary) problems (especially infection) following: A long period of time when you are unable to move or be active. BEFORE THE PROCEDURE  If the spirometer includes an indicator to show your best effort, your nurse or respiratory therapist will set it to a desired goal. If possible, sit up straight or lean slightly forward. Try not to slouch. Hold the incentive spirometer in an upright position. INSTRUCTIONS FOR USE  Sit on the edge of your bed if possible, or sit up as far as you can in bed or on a chair. Hold the incentive spirometer in an upright position. Breathe out normally. Place the mouthpiece in your mouth and seal your lips tightly around it. Breathe in slowly and as deeply  as possible, raising the piston or the ball toward the top of the column. Hold your breath for 3-5 seconds or for as long as possible. Allow the piston or ball to fall to the bottom of the column. Remove the mouthpiece from your mouth and breathe out normally. Rest for a few seconds and repeat Steps 1 through 7 at least 10 times every 1-2 hours when you are awake. Take your time and take a few normal breaths between deep breaths. The spirometer may include an indicator to show your best effort. Use the indicator as a goal to work toward during each repetition. After each set of 10 deep breaths, practice coughing to be sure your lungs are clear. If you have an incision (the cut made at the time  of surgery), support your incision when coughing by placing a pillow or rolled up towels firmly against it. Once you are able to get out of bed, walk around indoors and cough well. You may stop using the incentive spirometer when instructed by your caregiver.  RISKS AND COMPLICATIONS Take your time so you do not get dizzy or light-headed. If you are in pain, you may need to take or ask for pain medication before doing incentive spirometry. It is harder to take a deep breath if you are having pain. AFTER USE Rest and breathe slowly and easily. It can be helpful to keep track of a log of your progress. Your caregiver can provide you with a simple table to help with this. If you are using the spirometer at home, follow these instructions: [place] IF:  You are having difficultly using the spirometer. You have trouble using the spirometer as often as instructed. Your pain medication is not giving enough relief while using the spirometer. You develop fever of 100.5 F (38.1 C) or higher.                                                                                                    SEEK IMMEDIATE MEDICAL CARE IF:  You cough up bloody sputum that had not been present before. You develop fever of  102 F (38.9 C) or greater. You develop worsening pain at or near the incision site. MAKE SURE YOU:  Understand these instructions. Will watch your condition. Will get help right away if you are not doing well or get worse. Document Released: 09/05/2006 Document Revised: 07/18/2011 Document Reviewed: 11/06/2006 [facility] Patient Information 2014 [place], [place].

## 2022-04-09 LAB — HEMOGLOBIN A1C
Hgb A1c MFr Bld: 6 % — ABNORMAL HIGH (ref 4.8–5.6)
Mean Plasma Glucose: 126 mg/dL

## 2022-04-11 DIAGNOSIS — M1611 Unilateral primary osteoarthritis, right hip: Secondary | ICD-10-CM | POA: Diagnosis not present

## 2022-04-12 NOTE — Care Plan (Signed)
Ortho Bundle Case Management Note  Patient Details  Name: Jennifer Franco MRN: [id number] Date of Birth: [birth year].  patient seen in the office today for H&P. she will discharge to home with family. 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 she is known to the office from prior THR. she has memory issues herself and is very tearful in the office. she has all equipment at home. discharge instructions discussed and questions answered. Choice offered                   DME Arranged:    DME Agency:     HH Arranged:  PT HH Agency:  [facility]  Additional Comments: Please contact me with any questions of if this plan should need to change.  Ladell Heads,  [facility]  [phone] 04/12/2022, 4:10 PM

## 2022-04-13 ENCOUNTER — Ambulatory Visit: Payer: Medicare PPO | Admitting: Podiatry

## 2022-04-13 ENCOUNTER — Encounter: Payer: Self-pay | Admitting: Podiatry

## 2022-04-13 DIAGNOSIS — B351 Tinea unguium: Secondary | ICD-10-CM

## 2022-04-13 NOTE — Progress Notes (Signed)
This patient presents to the office with chief complaint of long thick painful nails.  Patient says the nails are painful walking and wearing shoes.  This patient is unable to self treat.  This patient is unable to trim her nails since she is unable to reach her nails.  She presents to the office for preventative foot care services.  General Appearance  Alert, conversant and in no acute stress.  Vascular  Dorsalis pedis and posterior tibial  pulses are  weakly palpable  bilaterally.  Capillary return is within normal limits  bilaterally. Temperature is within normal limits  bilaterally.  Neurologic  Senn-Weinstein monofilament wire test within normal limits  bilaterally. Muscle power within normal limits bilaterally.  Nails Thick disfigured discolored nails with subungual debris  from hallux through 3 right foot. No evidence of bacterial infection or drainage bilaterally.  Orthopedic  No limitations of motion  feet .  No crepitus or effusions noted.  No bony pathology or digital deformities noted.  Skin  normotropic skin with no porokeratosis noted bilaterally.  No signs of infections or ulcers noted.     Onychomycosis  Nails  right foot..  Pain in right toes  Pain in left toes  Debridement of nails both feet followed trimming the nails with dremel tool.    RTC 3 months.   Peggy Monk DPM   

## 2022-04-14 DIAGNOSIS — M1612 Unilateral primary osteoarthritis, left hip: Secondary | ICD-10-CM | POA: Diagnosis not present

## 2022-04-14 DIAGNOSIS — M6281 Muscle weakness (generalized): Secondary | ICD-10-CM | POA: Diagnosis not present

## 2022-04-14 DIAGNOSIS — R269 Unspecified abnormalities of gait and mobility: Secondary | ICD-10-CM | POA: Diagnosis not present

## 2022-04-14 DIAGNOSIS — M25652 Stiffness of left hip, not elsewhere classified: Secondary | ICD-10-CM | POA: Diagnosis not present

## 2022-04-18 NOTE — Anesthesia Preprocedure Evaluation (Signed)
Anesthesia Evaluation  Patient identified by MRN, date of birth, ID band Patient awake    Reviewed: Allergy & Precautions, NPO status , Patient's Chart, lab work & pertinent test results  Airway Mallampati: II  TM Distance: >3 FB Neck ROM: Full    Dental no notable dental hx. (+) Teeth Intact, Dental Advisory Given   Pulmonary former smoker   Pulmonary exam normal breath sounds clear to auscultation       Cardiovascular Normal cardiovascular exam Rhythm:Regular Rate:Normal     Neuro/Psych TIA (X 2 in 2022 and 2023)   GI/Hepatic   Endo/Other  Hypothyroidism    Renal/GU   Female genitourinary complaint: Sjogrens syndrome. Bladder CA    Musculoskeletal  (+) Arthritis , Rheumatoid disorders,  Fibromyalgia -  Abdominal   Peds  Hematology  (+) Blood dyscrasia, anemia Lab Results      Component                Value               Date                      WBC                      4.8                 04/08/2022                HGB                      11.8 (L)            04/08/2022                HCT                      37.1                04/08/2022                MCV                      90.9                04/08/2022                PLT                      272                 04/08/2022              Anesthesia Other Findings All Topamax Etodolac Sjogrens syndrome  Reproductive/Obstetrics                             Anesthesia Physical Anesthesia Plan  ASA: 3  Anesthesia Plan: Spinal   Post-op Pain Management: Regional block* and Minimal or no pain anticipated   Induction:   PONV Risk Score and Plan: 3 and Treatment may vary due to age or medical condition, Ondansetron and Midazolam  Airway Management Planned: Natural Airway and Nasal Cannula  Additional Equipment: None  Intra-op Plan:   Post-operative Plan:   Informed Consent: I have reviewed the patients History and Physical,  chart, labs and discussed the procedure including the risks, benefits and  alternatives for the proposed anesthesia with the patient or authorized representative who has indicated his/her understanding and acceptance.     Dental advisory given  Plan Discussed with:   Anesthesia Plan Comments: (R THA under Spinal)       Anesthesia Quick Evaluation

## 2022-04-18 NOTE — H&P (Signed)
HIP ARTHROPLASTY ADMISSION H&P  Patient ID: Jennifer Franco MRN: [id number] DOB/AGE: [birth year] 66 y.o.  Chief Complaint: right hip pain.  Planned Procedure Date: 04/19/22 Medical Clearance by Kelton Pillar Additional clearance by Dr. Leonie Man (neuro), Dr. Evelena Asa  (rheum)  HPI: Jennifer Franco is a 66 y.o. female who presents for evaluation of right hip djd. The patient has a history of pain and functional disability in the right hip due to arthritis and has failed non-surgical conservative treatments for greater than 12 weeks to include NSAID's and/or analgesics, flexibility and strengthening excercises, weight reduction as appropriate, and activity modification.  Onset of symptoms was gradual, starting 2 years ago with rapidlly worsening course since that time. The patient noted no past surgery on the right hip.  Patient currently rates pain at 8 out of 10 with activity. Patient has worsening of pain with activity and weight bearing and pain that interferes with activities of daily living.  Patient has evidence of joint space narrowing by imaging studies.  There is no active infection.  Past Medical History:  Diagnosis Date   Anemia    Anginal pain (HCC)    anxiety   Anxiety    Cancer (HCC)    bladder   Chills    CTS (carpal tunnel syndrome)    LEFT   Cyclical vomiting    Depression    Diarrhea, unspecified    Diverticulitis    Fibromyalgia    Hemorrhoids    History of adenomatous polyp of colon    History of morbid obesity    Hypothyroid    Hypothyroidism    Intractable nausea and vomiting    Irregular bowel habits    Joint pain    LLQ abdominal pain    Morbid obesity (HCC)    Nausea and vomiting    Onychomycosis    RA (rheumatoid arthritis) (HCC)    Reflux esophagitis    Reflux esophagitis    Sjogren's syndrome (HCC)    TIA (transient ischemic attack)    2 within 2022-2023   Tinea corporis    Unspecified mood (affective) disorder (HCC)    Weight  loss    Past Surgical History:  Procedure Laterality Date   ADENOIDECTOMY     CARPAL TUNNEL RELEASE Right    CESAREAN SECTION     CESAREAN SECTION     CHOLECYSTECTOMY     CYSTOSCOPY WITH STENT PLACEMENT N/A 08/01/2019   Procedure: CYSTOSCOPY WITH URETHRAL DILATION, BILATERAL RETROGRADE PYELOGRAMS, TRANSURETHRAL RESECTION OF SMALL BLADDER TUMOR, INSERTION OF BILATERAL OPEN ENDED CATHETERS WITH BILATERAL INJECTION OF FIREFLY;  Surgeon: Irine Seal, MD;  Location: [facility];  Service: Urology;  Laterality: N/A;   FLEXIBLE SIGMOIDOSCOPY N/A 08/01/2019   Procedure: FLEXIBLE SIGMOIDOSCOPY;  Surgeon: Ileana Roup, MD;  Location: [facility];  Service: General;  Laterality: N/A;   LAPAROSCOPIC APPENDECTOMY N/A 01/28/2021   Procedure: APPENDECTOMY LAPAROSCOPIC;  Surgeon: Ileana Roup, MD;  Location: [facility];  Service: General;  Laterality: N/A;   TOTAL HIP ARTHROPLASTY Left 02/15/2022   Procedure: TOTAL HIP ARTHROPLASTY;  Surgeon: Marchia Bond, MD;  Location: [facility];  Service: Orthopedics;  Laterality: Left;   wisdom teeth     Allergies  Allergen Reactions   Etodolac Diarrhea   Topamax [Topiramate] Other (See Comments)    Brain flutters    Prior to Admission medications   Medication Sig Start Date End Date Taking? Authorizing Provider  atorvastatin (LIPITOR) 20 MG tablet Take 1 tablet (20 mg total) by mouth daily.  03/31/21 02/15/22  Terrilee Croak, MD  baclofen (LIORESAL) 10 MG tablet Take 1 tablet (10 mg total) by mouth 3 (three) times daily. As needed for muscle spasm 02/16/22   Ventura Bruns, PA-C  Calcium Carb-Cholecalciferol (CALCIUM 600 + D) 600-5 MG-MCG TABS Take 1,000 Units by mouth daily.    [provider]  cyanocobalamin (VITAMIN B12) 1000 MCG tablet Take 1,000 mcg by mouth every other day.    [provider]  ferrous sulfate 325 (65 FE) MG tablet Take 325 mg by mouth daily with breakfast.    [provider]  folic acid (FOLVITE) 1 MG tablet Take  1 mg by mouth daily.    [provider]  HYDROcodone-acetaminophen (NORCO) 10-325 MG tablet Take 1 tablet by mouth every 6 (six) hours as needed. 02/16/22   Ventura Bruns, PA-C  Levothyroxine Sodium 125 MCG CAPS Take 125 mcg by mouth daily.    [provider]  ondansetron (ZOFRAN) 4 MG tablet Take 1 tablet (4 mg total) by mouth every 8 (eight) hours as needed for nausea or vomiting. 02/16/22   Ventura Bruns, PA-C  rivaroxaban (XARELTO) 10 MG TABS tablet Take 1 tablet (10 mg total) by mouth daily. 02/16/22   Merlene Pulling K, PA-C  sennosides-docusate sodium (SENOKOT-S) 8.6-50 MG tablet Take 2 tablets by mouth daily. 02/16/22   Merlene Pulling K, PA-C  sertraline (ZOLOFT) 25 MG tablet Take 1 tablet (25 mg total) by mouth daily. 12/14/21   Frann Rider, NP   Social History   Socioeconomic History   Marital status: Divorced    Spouse name: Not on file   Number of children: Not on file   Years of education: Not on file   Highest education level: Not on file  Occupational History   Not on file  Tobacco Use   Smoking status: Former   Smokeless tobacco: Never  Vaping Use   Vaping Use: Never used  Substance and Sexual Activity   Alcohol use: Not Currently   Drug use: No   Sexual activity: Not Currently  Other Topics Concern   Not on file  Social History Narrative   Not on file   Social Determinants of Health   Financial Resource Strain: Not on file  Food Insecurity: No Food Insecurity (02/15/2022)   Hunger Vital Sign    Worried About Running Out of Food in the Last Year: Never true    Ran Out of Food in the Last Year: Never true  Transportation Needs: No Transportation Needs (02/15/2022)   PRAPARE - Hydrologist (Medical): No    Lack of Transportation (Non-Medical): No  Physical Activity: Not on file  Stress: Not on file  Social Connections: Not on file   Family History  Problem Relation Age of Onset   Glaucoma Mother    GER  disease Mother    Colon polyps Mother    Breast cancer Other 83       Maternal Great Grandmother   Osteoarthritis Father    Alzheimer's disease Father    Colon polyps Maternal Grandmother    Colon polyps Maternal Grandfather     ROS: Currently denies lightheadedness, dizziness, Fever, chills, CP, SOB.   No personal history of DVT, PE, MI. Hx of CVA. No loose teeth or dentures All other systems have been reviewed and were otherwise currently negative with the exception of those mentioned in the HPI and as above.  Objective: Physical Exam: General: Alert, NAD. Trendelenberg  Gait  HEENT: EOMI, Good Neck Extension  Pulm: No increased work of breathing.  Clear B/L A/P w/o crackle or wheeze.  CV: RRR, No m/g/r appreciated  GI: soft, NT, ND Neuro: Neuro without gross focal deficit.  Sensation intact distally Skin: No lesions in the area of chief complaint MSK/Surgical Site: right Hip pain with passive ROM.  0-90 AFF.  5/5 strength.  NVI.    Imaging Review Plain radiographs demonstrate severe degenerative joint disease of the right hip.   The bone quality appears to be adequate for age and reported activity level.  Preoperative templating of the joint replacement has been completed, documented, and submitted to the Operating Room personnel in order to optimize intra-operative equipment management.  Assessment: right hip djd   Plan: Plan for Procedure(s): TOTAL HIP ARTHROPLASTY  The patient history, physical exam, clinical judgement of the provider and imaging are consistent with end stage degenerative joint disease and total joint arthroplasty is deemed medically necessary. The treatment options including medical management, injection therapy, and arthroplasty were discussed at length. The risks and benefits of Procedure(s): TOTAL HIP ARTHROPLASTY were presented and reviewed.  The risks of nonoperative treatment, versus surgical intervention including but not limited to continued  pain, aseptic loosening, stiffness, dislocation/subluxation, infection, bleeding, nerve injury, blood clots, cardiopulmonary complications, morbidity, mortality, among others were discussed. The patient verbalizes understanding and wishes to proceed with the plan.    Dental prophylaxis discussed and recommended for 2 years postoperatively.  The patient does meet the criteria for TXA which will be used perioperatively.   Xarelto  will be used postoperatively for DVT prophylaxis in addition to SCDs, and early ambulation. The patient is planning to be discharged home in care of family   Ventura Bruns, Hershal Coria 04/18/2022 10:14 AM

## 2022-04-19 ENCOUNTER — Encounter (HOSPITAL_COMMUNITY)
Admission: AD | Disposition: A | Discharge status: Home Health | Payer: Self-pay | Source: Home / Self Care | Attending: Orthopedic Surgery

## 2022-04-19 ENCOUNTER — Other Ambulatory Visit: Payer: Self-pay

## 2022-04-19 ENCOUNTER — Inpatient Hospital Stay (HOSPITAL_COMMUNITY)
Admission: AD | Admit: 2022-04-19 | Discharge: 2022-04-21 | DRG: 470 | Disposition: A | Discharge status: Home Health | Payer: Medicare PPO | Attending: Orthopedic Surgery | Admitting: Orthopedic Surgery

## 2022-04-19 ENCOUNTER — Observation Stay (HOSPITAL_COMMUNITY): Payer: Medicare PPO

## 2022-04-19 ENCOUNTER — Ambulatory Visit (HOSPITAL_COMMUNITY): Payer: Medicare PPO | Admitting: Anesthesiology

## 2022-04-19 ENCOUNTER — Ambulatory Visit (HOSPITAL_BASED_OUTPATIENT_CLINIC_OR_DEPARTMENT_OTHER): Payer: Medicare PPO | Admitting: Anesthesiology

## 2022-04-19 ENCOUNTER — Encounter (HOSPITAL_COMMUNITY): Payer: Self-pay | Admitting: Orthopedic Surgery

## 2022-04-19 DIAGNOSIS — Z79890 Hormone replacement therapy (postmenopausal): Secondary | ICD-10-CM

## 2022-04-19 DIAGNOSIS — Z01818 Encounter for other preprocedural examination: Secondary | ICD-10-CM

## 2022-04-19 DIAGNOSIS — M1611 Unilateral primary osteoarthritis, right hip: Secondary | ICD-10-CM | POA: Diagnosis present

## 2022-04-19 DIAGNOSIS — Z96641 Presence of right artificial hip joint: Secondary | ICD-10-CM

## 2022-04-19 DIAGNOSIS — Z96642 Presence of left artificial hip joint: Secondary | ICD-10-CM | POA: Diagnosis present

## 2022-04-19 DIAGNOSIS — Z8673 Personal history of transient ischemic attack (TIA), and cerebral infarction without residual deficits: Secondary | ICD-10-CM | POA: Diagnosis not present

## 2022-04-19 DIAGNOSIS — M533 Sacrococcygeal disorders, not elsewhere classified: Secondary | ICD-10-CM | POA: Diagnosis not present

## 2022-04-19 DIAGNOSIS — Z471 Aftercare following joint replacement surgery: Secondary | ICD-10-CM | POA: Diagnosis not present

## 2022-04-19 DIAGNOSIS — D638 Anemia in other chronic diseases classified elsewhere: Secondary | ICD-10-CM | POA: Diagnosis not present

## 2022-04-19 DIAGNOSIS — Z888 Allergy status to other drugs, medicaments and biological substances status: Secondary | ICD-10-CM

## 2022-04-19 DIAGNOSIS — Z9049 Acquired absence of other specified parts of digestive tract: Secondary | ICD-10-CM

## 2022-04-19 DIAGNOSIS — Z7901 Long term (current) use of anticoagulants: Secondary | ICD-10-CM | POA: Diagnosis not present

## 2022-04-19 DIAGNOSIS — E039 Hypothyroidism, unspecified: Secondary | ICD-10-CM

## 2022-04-19 DIAGNOSIS — Z83719 Family history of colon polyps, unspecified: Secondary | ICD-10-CM | POA: Diagnosis not present

## 2022-04-19 DIAGNOSIS — Z803 Family history of malignant neoplasm of breast: Secondary | ICD-10-CM

## 2022-04-19 DIAGNOSIS — D62 Acute posthemorrhagic anemia: Secondary | ICD-10-CM | POA: Diagnosis not present

## 2022-04-19 DIAGNOSIS — M797 Fibromyalgia: Secondary | ICD-10-CM | POA: Diagnosis present

## 2022-04-19 DIAGNOSIS — Z820 Family history of epilepsy and other diseases of the nervous system: Secondary | ICD-10-CM | POA: Diagnosis not present

## 2022-04-19 DIAGNOSIS — Z86010 Personal history of colonic polyps: Secondary | ICD-10-CM

## 2022-04-19 DIAGNOSIS — R7303 Prediabetes: Secondary | ICD-10-CM

## 2022-04-19 DIAGNOSIS — M069 Rheumatoid arthritis, unspecified: Secondary | ICD-10-CM | POA: Diagnosis present

## 2022-04-19 DIAGNOSIS — Z83511 Family history of glaucoma: Secondary | ICD-10-CM | POA: Diagnosis not present

## 2022-04-19 HISTORY — PX: TOTAL HIP ARTHROPLASTY: SHX124

## 2022-04-19 SURGERY — ARTHROPLASTY, HIP, TOTAL,POSTERIOR APPROACH
Anesthesia: Spinal | Site: Hip | Laterality: Right

## 2022-04-19 MED ORDER — ONDANSETRON HCL 4 MG/2ML IJ SOLN: 4.0000 mg | Freq: Four times a day (QID) | INTRAMUSCULAR | Status: DC | PRN

## 2022-04-19 MED ORDER — METOCLOPRAMIDE HCL 5 MG/ML IJ SOLN: 5.0000 mg | Freq: Three times a day (TID) | INTRAMUSCULAR | Status: DC | PRN

## 2022-04-19 MED ORDER — ACETAMINOPHEN 500 MG PO TABS
1000.0000 mg | ORAL_TABLET | Freq: Four times a day (QID) | ORAL | Status: AC
  Administered 2022-04-19: 1000 mg via ORAL
  Filled 2022-04-19: qty 2

## 2022-04-19 MED ORDER — METHOCARBAMOL 500 MG PO TABS
500.0000 mg | ORAL_TABLET | Freq: Four times a day (QID) | ORAL | Status: DC | PRN
  Filled 2022-04-19: qty 1

## 2022-04-19 MED ORDER — EPHEDRINE SULFATE (PRESSORS) 50 MG/ML IJ SOLN
INTRAMUSCULAR | Status: DC | PRN
  Administered 2022-04-19: 5 mg via INTRAVENOUS
  Administered 2022-04-19: 10 mg via INTRAVENOUS
  Administered 2022-04-19: 5 mg via INTRAVENOUS

## 2022-04-19 MED ORDER — OXYCODONE HCL 5 MG/5ML PO SOLN: 5.0000 mg | Freq: Once | ORAL | Status: DC | PRN

## 2022-04-19 MED ORDER — ATORVASTATIN CALCIUM 20 MG PO TABS
20.0000 mg | ORAL_TABLET | Freq: Every day | ORAL | Status: DC
  Administered 2022-04-20: 20 mg via ORAL
  Filled 2022-04-19 (×2): qty 1

## 2022-04-19 MED ORDER — TRANEXAMIC ACID-NACL 1000-0.7 MG/100ML-% IV SOLN
1000.0000 mg | INTRAVENOUS | Status: AC
  Administered 2022-04-19: 1000 mg via INTRAVENOUS
  Filled 2022-04-19: qty 100

## 2022-04-19 MED ORDER — ALUM & MAG HYDROXIDE-SIMETH 200-200-20 MG/5ML PO SUSP: 30.0000 mL | ORAL | Status: DC | PRN

## 2022-04-19 MED ORDER — HYDROCODONE-ACETAMINOPHEN 5-325 MG PO TABS
1.0000 | ORAL_TABLET | ORAL | Status: DC | PRN
  Administered 2022-04-19 – 2022-04-21 (×5): 2 via ORAL
  Filled 2022-04-19 (×5): qty 2

## 2022-04-19 MED ORDER — FENTANYL CITRATE (PF) 100 MCG/2ML IJ SOLN
INTRAMUSCULAR | Status: DC | PRN
  Administered 2022-04-19 (×2): 50 ug via INTRAVENOUS

## 2022-04-19 MED ORDER — HYDROMORPHONE HCL 1 MG/ML IJ SOLN: 0.5000 mg | INTRAMUSCULAR | Status: DC | PRN

## 2022-04-19 MED ORDER — KETOROLAC TROMETHAMINE 30 MG/ML IJ SOLN
INTRAMUSCULAR | Status: DC | PRN
  Administered 2022-04-19: 30 mg via INTRAVENOUS

## 2022-04-19 MED ORDER — POTASSIUM CHLORIDE IN NACL 20-0.9 MEQ/L-% IV SOLN
INTRAVENOUS | Status: DC
  Filled 2022-04-19 (×2): qty 1000

## 2022-04-19 MED ORDER — DOCUSATE SODIUM 100 MG PO CAPS
100.0000 mg | ORAL_CAPSULE | Freq: Two times a day (BID) | ORAL | Status: DC
  Administered 2022-04-19 – 2022-04-21 (×4): 100 mg via ORAL
  Filled 2022-04-19 (×4): qty 1

## 2022-04-19 MED ORDER — DIPHENHYDRAMINE HCL 12.5 MG/5ML PO ELIX: 12.5000 mg | ORAL_SOLUTION | ORAL | Status: DC | PRN

## 2022-04-19 MED ORDER — METOCLOPRAMIDE HCL 5 MG PO TABS: 5.0000 mg | ORAL_TABLET | Freq: Three times a day (TID) | ORAL | Status: DC | PRN

## 2022-04-19 MED ORDER — PHENYLEPHRINE 80 MCG/ML (10ML) SYRINGE FOR IV PUSH (FOR BLOOD PRESSURE SUPPORT)
PREFILLED_SYRINGE | INTRAVENOUS | Status: AC
  Filled 2022-04-19: qty 10

## 2022-04-19 MED ORDER — GLYCOPYRROLATE 0.2 MG/ML IJ SOLN
INTRAMUSCULAR | Status: DC | PRN
  Administered 2022-04-19: .2 mg via INTRAVENOUS

## 2022-04-19 MED ORDER — ACETAMINOPHEN 325 MG PO TABS: 325.0000 mg | ORAL_TABLET | Freq: Four times a day (QID) | ORAL | Status: DC | PRN

## 2022-04-19 MED ORDER — PROPOFOL 500 MG/50ML IV EMUL
INTRAVENOUS | Status: DC | PRN
  Administered 2022-04-19: 100 ug/kg/min via INTRAVENOUS

## 2022-04-19 MED ORDER — GLYCOPYRROLATE 0.2 MG/ML IJ SOLN
INTRAMUSCULAR | Status: AC
  Filled 2022-04-19: qty 1

## 2022-04-19 MED ORDER — RIVAROXABAN 10 MG PO TABS: 10.0000 mg | ORAL_TABLET | Freq: Every day | ORAL | 0 refills | Status: AC

## 2022-04-19 MED ORDER — AMISULPRIDE (ANTIEMETIC) 5 MG/2ML IV SOLN: 10.0000 mg | Freq: Once | INTRAVENOUS | Status: DC | PRN

## 2022-04-19 MED ORDER — PHENYLEPHRINE HCL-NACL 20-0.9 MG/250ML-% IV SOLN
INTRAVENOUS | Status: DC | PRN
  Administered 2022-04-19: 40 ug/min via INTRAVENOUS

## 2022-04-19 MED ORDER — ACETAMINOPHEN 10 MG/ML IV SOLN: 1000.0000 mg | Freq: Once | INTRAVENOUS | Status: DC | PRN

## 2022-04-19 MED ORDER — CEFAZOLIN SODIUM-DEXTROSE 2-4 GM/100ML-% IV SOLN
2.0000 g | INTRAVENOUS | Status: AC
  Administered 2022-04-19: 2 g via INTRAVENOUS
  Filled 2022-04-19: qty 100

## 2022-04-19 MED ORDER — POVIDONE-IODINE 10 % EX SWAB: 2.0000 | Freq: Once | CUTANEOUS | Status: DC

## 2022-04-19 MED ORDER — OXYCODONE HCL 5 MG PO TABS: 10.0000 mg | ORAL_TABLET | ORAL | Status: DC | PRN

## 2022-04-19 MED ORDER — POLYETHYLENE GLYCOL 3350 17 G PO PACK: 17.0000 g | PACK | Freq: Every day | ORAL | Status: DC | PRN

## 2022-04-19 MED ORDER — FENTANYL CITRATE (PF) 100 MCG/2ML IJ SOLN
INTRAMUSCULAR | Status: AC
  Filled 2022-04-19: qty 2

## 2022-04-19 MED ORDER — MIDAZOLAM HCL 2 MG/2ML IJ SOLN
INTRAMUSCULAR | Status: AC
  Filled 2022-04-19: qty 2

## 2022-04-19 MED ORDER — DEXAMETHASONE SODIUM PHOSPHATE 10 MG/ML IJ SOLN
10.0000 mg | Freq: Once | INTRAMUSCULAR | Status: AC
  Administered 2022-04-20: 10 mg via INTRAVENOUS
  Filled 2022-04-19: qty 1

## 2022-04-19 MED ORDER — ONDANSETRON HCL 4 MG/2ML IJ SOLN
INTRAMUSCULAR | Status: DC | PRN
  Administered 2022-04-19: 4 mg via INTRAVENOUS

## 2022-04-19 MED ORDER — OXYCODONE HCL 5 MG PO TABS
5.0000 mg | ORAL_TABLET | ORAL | Status: DC | PRN
  Administered 2022-04-19: 5 mg via ORAL
  Filled 2022-04-19: qty 1

## 2022-04-19 MED ORDER — MAGNESIUM CITRATE PO SOLN: 1.0000 | Freq: Once | ORAL | Status: DC | PRN

## 2022-04-19 MED ORDER — LEVOTHYROXINE SODIUM 125 MCG PO CAPS: 125.0000 ug | ORAL_CAPSULE | Freq: Every day | ORAL | Status: DC

## 2022-04-19 MED ORDER — PHENYLEPHRINE HCL (PRESSORS) 10 MG/ML IV SOLN
INTRAVENOUS | Status: AC
  Filled 2022-04-19: qty 1

## 2022-04-19 MED ORDER — FERROUS SULFATE 325 (65 FE) MG PO TABS
325.0000 mg | ORAL_TABLET | Freq: Every day | ORAL | Status: DC
  Administered 2022-04-20 – 2022-04-21 (×2): 325 mg via ORAL
  Filled 2022-04-19 (×2): qty 1

## 2022-04-19 MED ORDER — SERTRALINE HCL 25 MG PO TABS
25.0000 mg | ORAL_TABLET | Freq: Every day | ORAL | Status: DC
  Administered 2022-04-19 – 2022-04-21 (×3): 25 mg via ORAL
  Filled 2022-04-19 (×3): qty 1

## 2022-04-19 MED ORDER — BUPIVACAINE IN DEXTROSE 0.75-8.25 % IT SOLN
INTRATHECAL | Status: DC | PRN
  Administered 2022-04-19: 12.7 mg via INTRATHECAL

## 2022-04-19 MED ORDER — CHLORHEXIDINE GLUCONATE 0.12 % MT SOLN
15.0000 mL | Freq: Once | OROMUCOSAL | Status: AC
  Administered 2022-04-19: 15 mL via OROMUCOSAL

## 2022-04-19 MED ORDER — METHOCARBAMOL 500 MG IVPB - SIMPLE MED: 500.0000 mg | Freq: Four times a day (QID) | INTRAVENOUS | Status: DC | PRN

## 2022-04-19 MED ORDER — HYDROMORPHONE HCL 1 MG/ML IJ SOLN: 0.2500 mg | INTRAMUSCULAR | Status: DC | PRN

## 2022-04-19 MED ORDER — CEFAZOLIN SODIUM-DEXTROSE 1-4 GM/50ML-% IV SOLN
1.0000 g | Freq: Four times a day (QID) | INTRAVENOUS | Status: AC
  Administered 2022-04-19 (×2): 1 g via INTRAVENOUS
  Filled 2022-04-19 (×2): qty 50

## 2022-04-19 MED ORDER — BISACODYL 10 MG RE SUPP: 10.0000 mg | Freq: Every day | RECTAL | Status: DC | PRN

## 2022-04-19 MED ORDER — PHENOL 1.4 % MT LIQD: 1.0000 | OROMUCOSAL | Status: DC | PRN

## 2022-04-19 MED ORDER — KETOROLAC TROMETHAMINE 30 MG/ML IJ SOLN
INTRAMUSCULAR | Status: AC
  Filled 2022-04-19: qty 1

## 2022-04-19 MED ORDER — ACETAMINOPHEN 500 MG PO TABS
1000.0000 mg | ORAL_TABLET | Freq: Once | ORAL | Status: AC
  Administered 2022-04-19: 1000 mg via ORAL
  Filled 2022-04-19: qty 2

## 2022-04-19 MED ORDER — BUPIVACAINE HCL (PF) 0.25 % IJ SOLN
INTRAMUSCULAR | Status: DC | PRN
  Administered 2022-04-19: 30 mL

## 2022-04-19 MED ORDER — LACTATED RINGERS IV SOLN: INTRAVENOUS | Status: DC

## 2022-04-19 MED ORDER — RIVAROXABAN 10 MG PO TABS
10.0000 mg | ORAL_TABLET | Freq: Every day | ORAL | Status: DC
  Administered 2022-04-20 – 2022-04-21 (×2): 10 mg via ORAL
  Filled 2022-04-19 (×2): qty 1

## 2022-04-19 MED ORDER — ORAL CARE MOUTH RINSE: 15.0000 mL | Freq: Once | OROMUCOSAL | Status: AC

## 2022-04-19 MED ORDER — EPHEDRINE 5 MG/ML INJ
INTRAVENOUS | Status: AC
  Filled 2022-04-19: qty 5

## 2022-04-19 MED ORDER — PHENYLEPHRINE HCL (PRESSORS) 10 MG/ML IV SOLN
INTRAVENOUS | Status: DC | PRN
  Administered 2022-04-19 (×4): 80 ug via INTRAVENOUS
  Administered 2022-04-19 (×2): 160 ug via INTRAVENOUS

## 2022-04-19 MED ORDER — DEXAMETHASONE SODIUM PHOSPHATE 4 MG/ML IJ SOLN
INTRAMUSCULAR | Status: DC | PRN
  Administered 2022-04-19: 8 mg via INTRAVENOUS

## 2022-04-19 MED ORDER — LEVOTHYROXINE SODIUM 125 MCG PO TABS
125.0000 ug | ORAL_TABLET | Freq: Every day | ORAL | Status: DC
  Administered 2022-04-20 – 2022-04-21 (×2): 125 ug via ORAL
  Filled 2022-04-19 (×2): qty 1

## 2022-04-19 MED ORDER — POVIDONE-IODINE 7.5 % EX SOLN: Freq: Once | CUTANEOUS | Status: DC

## 2022-04-19 MED ORDER — HYDROCODONE-ACETAMINOPHEN 7.5-325 MG PO TABS: 1.0000 | ORAL_TABLET | ORAL | Status: DC | PRN

## 2022-04-19 MED ORDER — MENTHOL 3 MG MT LOZG: 1.0000 | LOZENGE | OROMUCOSAL | Status: DC | PRN

## 2022-04-19 MED ORDER — ONDANSETRON HCL 4 MG PO TABS: 4.0000 mg | ORAL_TABLET | Freq: Four times a day (QID) | ORAL | Status: DC | PRN

## 2022-04-19 MED ORDER — TRANEXAMIC ACID-NACL 1000-0.7 MG/100ML-% IV SOLN
1000.0000 mg | Freq: Once | INTRAVENOUS | Status: AC
  Administered 2022-04-19: 1000 mg via INTRAVENOUS
  Filled 2022-04-19: qty 100

## 2022-04-19 MED ORDER — DEXAMETHASONE SODIUM PHOSPHATE 10 MG/ML IJ SOLN
INTRAMUSCULAR | Status: AC
  Filled 2022-04-19: qty 1

## 2022-04-19 MED ORDER — STERILE WATER FOR IRRIGATION IR SOLN
Status: DC | PRN
  Administered 2022-04-19: 2000 mL

## 2022-04-19 MED ORDER — MIDAZOLAM HCL 5 MG/5ML IJ SOLN
INTRAMUSCULAR | Status: DC | PRN
  Administered 2022-04-19 (×2): 1 mg via INTRAVENOUS

## 2022-04-19 MED ORDER — BUPIVACAINE HCL 0.25 % IJ SOLN
INTRAMUSCULAR | Status: AC
  Filled 2022-04-19: qty 1

## 2022-04-19 MED ORDER — HYDROCODONE-ACETAMINOPHEN 10-325 MG PO TABS: 1.0000 | ORAL_TABLET | Freq: Four times a day (QID) | ORAL | 0 refills | Status: AC | PRN

## 2022-04-19 MED ORDER — ONDANSETRON HCL 4 MG/2ML IJ SOLN: 4.0000 mg | Freq: Once | INTRAMUSCULAR | Status: DC | PRN

## 2022-04-19 MED ORDER — OXYCODONE HCL 5 MG PO TABS: 5.0000 mg | ORAL_TABLET | Freq: Once | ORAL | Status: DC | PRN

## 2022-04-19 MED ORDER — 0.9 % SODIUM CHLORIDE (POUR BTL) OPTIME
TOPICAL | Status: DC | PRN
  Administered 2022-04-19: 1000 mL

## 2022-04-19 MED ORDER — ONDANSETRON HCL 4 MG/2ML IJ SOLN
INTRAMUSCULAR | Status: AC
  Filled 2022-04-19: qty 2

## 2022-04-19 SURGICAL SUPPLY — 61 items
BAG COUNTER SPONGE SURGICOUNT (BAG) IMPLANT
BAG SPNG CNTER NS LX DISP (BAG)
BALL HIP CERAMIC (Hips) IMPLANT
BLADE SAW SGTL 73X25 THK (BLADE) ×2 IMPLANT
CLSR STERI-STRIP ANTIMIC 1/2X4 (GAUZE/BANDAGES/DRESSINGS) ×4 IMPLANT
COVER SURGICAL LIGHT HANDLE (MISCELLANEOUS) ×2 IMPLANT
CUP ACET PINNACLE SECTR 48MM (Joint) IMPLANT
DRAPE INCISE IOBAN 66X45 STRL (DRAPES) ×2 IMPLANT
DRAPE ORTHO SPLIT 77X108 STRL (DRAPES) ×2
DRAPE POUCH INSTRU U-SHP 10X18 (DRAPES) ×2 IMPLANT
DRAPE SHEET LG 3/4 BI-LAMINATE (DRAPES) ×2 IMPLANT
DRAPE SURG 17X11 SM STRL (DRAPES) ×2 IMPLANT
DRAPE SURG ORHT 6 SPLT 77X108 (DRAPES) ×4 IMPLANT
DRAPE U-SHAPE 47X51 STRL (DRAPES) ×2 IMPLANT
DRSG MEPILEX POST OP 4X8 (GAUZE/BANDAGES/DRESSINGS) ×2 IMPLANT
DURAPREP 26ML APPLICATOR (WOUND CARE) ×4 IMPLANT
ELECT BLADE TIP CTD 4 INCH (ELECTRODE) ×2 IMPLANT
ELECT REM PT RETURN 15FT ADLT (MISCELLANEOUS) ×2 IMPLANT
ELIMINATOR HOLE APEX DEPUY (Hips) IMPLANT
FACESHIELD WRAPAROUND (MASK) ×2 IMPLANT
FACESHIELD WRAPAROUND OR TEAM (MASK) ×4 IMPLANT
GLOVE BIO SURGEON STRL SZ 6.5 (GLOVE) ×2 IMPLANT
GLOVE BIO SURGEON STRL SZ7.5 (GLOVE) ×2 IMPLANT
GLOVE BIOGEL PI IND STRL 7.0 (GLOVE) ×2 IMPLANT
GLOVE BIOGEL PI IND STRL 8 (GLOVE) ×2 IMPLANT
GOWN STRL SURGICAL XL XLNG (GOWN DISPOSABLE) ×4 IMPLANT
HIP BALL CERAMIC (Hips) ×1 IMPLANT
HOOD PEEL AWAY T7 (MISCELLANEOUS) ×4 IMPLANT
K-WIRE 2.0 (WIRE) ×1
K-WIRE TROCAR PT 2.0 150MM (WIRE) ×1
KIT BASIN OR (CUSTOM PROCEDURE TRAY) ×2 IMPLANT
KIT TURNOVER KIT A (KITS) IMPLANT
KWIRE TROCAR PT 2.0 150 (WIRE) ×2 IMPLANT
KWIRE TROCAR PT 2.0 150MM (WIRE) ×1 IMPLANT
MANIFOLD NEPTUNE II (INSTRUMENTS) ×2 IMPLANT
NDL MA TROC 1/2 (NEEDLE) IMPLANT
NDL SAFETY ECLIP 18X1.5 (MISCELLANEOUS) ×4 IMPLANT
NEEDLE ANCHOR KEITH 2 7/8 STR (NEEDLE) ×2 IMPLANT
NEEDLE MA TROC 1/2 (NEEDLE) IMPLANT
NS IRRIG 1000ML POUR BTL (IV SOLUTION) ×2 IMPLANT
PACK TOTAL JOINT (CUSTOM PROCEDURE TRAY) ×2 IMPLANT
PINN ALTRX NEUT ID X OD 32X48 IMPLANT
PINNSECTOR W/GRIP ACE CUP 48MM (Joint) ×1 IMPLANT
PROTECTOR NERVE ULNAR (MISCELLANEOUS) ×2 IMPLANT
SCREW 6.5MMX25MM (Screw) IMPLANT
STEM SUMMIT STD 4 (Stem) IMPLANT
SUCTION FRAZIER HANDLE 12FR (TUBING) ×1
SUCTION TUBE FRAZIER 12FR DISP (TUBING) ×2 IMPLANT
SUT ETHIBOND NAB CT1 #1 30IN (SUTURE) ×6 IMPLANT
SUT VIC AB 0 CT1 36 (SUTURE) ×2 IMPLANT
SUT VIC AB 1 CT1 27 (SUTURE) ×1
SUT VIC AB 1 CT1 27XBRD ANBCTR (SUTURE) IMPLANT
SUT VIC AB 1 CT1 36 (SUTURE) ×4 IMPLANT
SUT VIC AB 2-0 CT1 27 (SUTURE) ×3
SUT VIC AB 2-0 CT1 TAPERPNT 27 (SUTURE) ×6 IMPLANT
SUT VIC AB 3-0 SH 8-18 (SUTURE) ×2 IMPLANT
SYR CONTROL 10ML LL (SYRINGE) ×4 IMPLANT
TOWEL OR 17X26 10 PK STRL BLUE (TOWEL DISPOSABLE) ×2 IMPLANT
TRAY FOLEY MTR SLVR 16FR STAT (SET/KITS/TRAYS/PACK) ×2 IMPLANT
TUBE SUCTION HIGH CAP CLEAR NV (SUCTIONS) ×2 IMPLANT
WATER STERILE IRR 1000ML POUR (IV SOLUTION) ×4 IMPLANT

## 2022-04-19 NOTE — Anesthesia Procedure Notes (Signed)
Spinal  Patient location during procedure: OB Start time: 04/19/2022 7:29 AM End time: 04/19/2022 7:34 AM Reason for block: surgical anesthesia Staffing Performed: anesthesiologist  Anesthesiologist: Barnet Glasgow, MD Performed by: Barnet Glasgow, MD Authorized by: Barnet Glasgow, MD   Preanesthetic Checklist Completed: patient identified, IV checked, risks and benefits discussed, surgical consent, monitors and equipment checked, pre-op evaluation and timeout performed Spinal Block Patient position: sitting Prep: DuraPrep and site prepped and draped Patient monitoring: heart rate, cardiac monitor, continuous pulse ox and blood pressure Approach: midline Location: L3-4 Injection technique: single-shot Needle Needle type: Pencan  Needle gauge: 24 G Needle length: 10 cm Needle insertion depth: 7 cm Assessment Sensory level: T4 Events: CSF return Additional Notes  1 Attempt (s). Pt tolerated procedure well.

## 2022-04-19 NOTE — Interval H&P Note (Signed)
History and Physical Interval Note:  04/19/2022 7:17 AM  Jennifer Franco  has presented today for surgery, with the diagnosis of right hip djd.  The various methods of treatment have been discussed with the patient and family. After consideration of risks, benefits and other options for treatment, the patient has consented to  Procedure(s): TOTAL HIP ARTHROPLASTY (Right) as a surgical intervention.  The patient's history has been reviewed, patient examined, no change in status, stable for surgery.  I have reviewed the patient's chart and labs.  Questions were answered to the patient's satisfaction.     Johnny Bridge

## 2022-04-19 NOTE — Op Note (Signed)
04/19/2022  9:36 AM  PATIENT:  Jennifer Franco   MRN: [id number]  PRE-OPERATIVE DIAGNOSIS: Right hip primary localized osteoarthritis  POST-OPERATIVE DIAGNOSIS:  same  PROCEDURE:  Procedure(s): TOTAL HIP ARTHROPLASTY  PREOPERATIVE INDICATIONS:    Jennifer Franco is an 66 y.o. female who has a diagnosis of right hip primary localized osteoarthritis and elected for surgical management after failing conservative treatment.  The risks benefits and alternatives were discussed with the patient including but not limited to the risks of nonoperative treatment, versus surgical intervention including infection, bleeding, nerve injury, periprosthetic fracture, the need for revision surgery, dislocation, leg length discrepancy, blood clots, cardiopulmonary complications, morbidity, mortality, among others, and they were willing to proceed.     OPERATIVE REPORT     SURGEON:  Marchia Bond, MD    ASSISTANT:  Merlene Pulling, PA-C, (Present throughout the entire procedure,  necessary for completion of procedure in a timely manner, assisting with retraction, instrumentation, and closure)     ANESTHESIA: Spinal  ESTIMATED BLOOD LOSS: 062 mL    COMPLICATIONS:  None.     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  I had at least 2 mm of anterior wall showing, I readjusted the alignment and position in order to make sure was not too anteroverted hopefully it is not, I did need good coverage posteriorly for stability.  I felt like the length was matching nearly completely, she may have been slightly longer on the left side although the right side felt stable.  If I went to a 5 it would have been extremely proud, I still had about 1 tooth showing on the floor despite having lateralized multiple times.  She was much tighter proximally than distally.  COMPONENTS:  [employer] fit femur size 4 with a 32 mm +5 metallic head ball and a  Gription Acetabular shell size 48, with a single cancellous screw for backup fixation, with an apex hole eliminator and a +4 neutral polyethylene liner.    PROCEDURE IN DETAIL:   The patient was met in the holding area and  identified.  The appropriate hip was identified and marked at the operative site.  The patient was then transported to the OR  and  placed under anesthesia.  At that point, the patient was  placed in the lateral decubitus position with the operative side up and  secured to the operating room table and all bony prominences padded.     The operative lower extremity was prepped from the iliac crest to the distal leg.  Sterile draping was performed.  Time out was performed prior to incision.      A routine posterolateral approach was utilized via sharp dissection  carried down to the subcutaneous tissue.  Gross bleeders were Bovie coagulated.  The iliotibial band was identified and incised along the length of the skin incision.  Self-retaining retractors were  inserted.  With the hip internally rotated, the short external rotators  were identified. The piriformis and capsule was tagged with FiberWire, and the hip capsule released in a T-type fashion.  The femoral neck was exposed, and I resected the femoral neck using the appropriate jig. This was performed at approximately a thumb's breadth above the lesser trochanter.    I then exposed the deep acetabulum, cleared out any tissue including the ligamentum teres.  A wing retractor was placed.  After adequate visualization, I excised the labrum, and then sequentially reamed.  I  placed the trial acetabulum, which seated nicely, and then impacted the real cup into place.  Appropriate version and inclination was confirmed clinically matching their bony anatomy, and also with the use of the jig.  I placed a cancellous screw to augment fixation.  A trial polyethylene liner was placed and the wing retractor removed.    I then prepared the  proximal femur using the cookie-cutter, the lateralizing reamer, and then sequentially reamed and broached.  A trial broach, neck, and head was utilized, and I reduced the hip and it was found to have excellent stability with functional range of motion. The trial components were then removed, and the real polyethylene liner was placed.  I then impacted the real femoral prosthesis into place into the appropriate version, slightly anteverted to the normal anatomy, and I impacted the real head ball into place. The hip was then reduced and taken through functional range of motion and found to have excellent stability. Leg lengths were restored.  I then used a 2 mm drill bits to pass the FiberWire suture from the capsule and piriformis through the greater trochanter, and secured this. Excellent posterior capsular repair was achieved. I also closed the T in the capsule.  I then irrigated the hip copiously again with pulse lavage, and repaired the fascia with Vicryl, followed by Vicryl for the subcutaneous tissue, Monocryl for the skin, Steri-Strips and sterile gauze. The wounds were injected. The patient was then awakened and returned to PACU in stable and satisfactory condition. There were no complications.  Marchia Bond, MD Orthopedic Surgeon [phone]   04/19/2022 9:36 AM

## 2022-04-19 NOTE — Evaluation (Signed)
Physical Therapy Evaluation Patient Details Name: Jennifer Franco MRN: [id number] DOB: 1956/02/04 Today's Date: 04/19/2022  History of Present Illness  66 yo female S/P  R THA-posterior approach. PMH: R THA  10/23, angina, fibromyalgia,hypothyroid, RA, SJogren's syndrome < TIA  Clinical Impression  Pt admitted with above diagnosis.  Pt currently with functional limitations due to the deficits listed below (see PT Problem List). Pt will benefit from skilled PT to increase their independence and safety with mobility to allow discharge to the venue listed below.     Monitored BP, 113/64 after stand  step to recliner. Patient limited by pain and weakness. Patient did stand with Rw and step to recliner. Patient should progress, may need extra therapy sessions as patient has a flight of stps to negotiate.      Recommendations for follow up therapy are one component of a multi-disciplinary discharge planning process, led by the attending physician.  Recommendations may be updated based on patient status, additional functional criteria and insurance authorization.  Follow Up Recommendations Follow physician's recommendations for discharge plan and follow up therapies Can patient physically be transported by private vehicle: Yes    Assistance Recommended at Discharge Intermittent Supervision/Assistance  Patient can return home with the following  A little help with walking and/or transfers;A little help with bathing/dressing/bathroom;Assistance with cooking/housework;Assist for transportation;Help with stairs or ramp for entrance    Equipment Recommendations None recommended by PT  Recommendations for Other Services       Functional Status Assessment Patient has had a recent decline in their functional status and demonstrates the ability to make significant improvements in function in a reasonable and predictable amount of time.     Precautions / Restrictions Precautions Precautions:  Fall Restrictions Weight Bearing Restrictions: No RLE Weight Bearing: Weight bearing as tolerated      Mobility  Bed Mobility Overal bed mobility: Needs Assistance Bed Mobility: Supine to Sit     Supine to sit: Mod assist     General bed mobility comments: use of belt, bed pad to slide to bed edge    Transfers Overall transfer level: Needs assistance Equipment used: Rolling walker (2 wheels) Transfers: Sit to/from Stand, Bed to chair/wheelchair/BSC Sit to Stand: Min assist   Step pivot transfers: Min assist       General transfer comment: cues for  WBAT    Ambulation/Gait                  Stairs            Wheelchair Mobility    Modified Rankin (Stroke Patients Only)       Balance Overall balance assessment: Mild deficits observed, not formally tested                                           Pertinent Vitals/Pain Pain Assessment Pain Assessment: 0-10 Pain Score: 6  Pain Location: right hip Pain Descriptors / Indicators: Aching, Cramping, Grimacing, Guarding Pain Intervention(s): Repositioned, Ice applied, Limited activity within patient's tolerance, Monitored during session    Home Living Family/patient expects to be discharged to:: Private residence Living Arrangements: Non-relatives/Friends Available Help at Discharge: Family;Available PRN/intermittently;Available 24 hours/day Type of Home: House Home Access: Stairs to enter Entrance Stairs-Rails: Chemical engineer of Steps: 4 Alternate Level Stairs-Number of Steps: 10 down to basement to her BR Home Layout: Two level Home Equipment: Conservation officer, nature (  2 wheels);Cane - single point;BSC/3in1 Additional Comments: 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 Pt has hoyer lift, hospital bed, and various walkers and canes and w/c for her mom, has a caregiver for mother    Prior Function Prior Level of Function :  Independent/Modified Independent;Driving                     Hand Dominance   Dominant Hand: Right    Extremity/Trunk Assessment   Upper Extremity Assessment Upper Extremity Assessment: Overall WFL for tasks assessed    Lower Extremity Assessment Lower Extremity Assessment: RLE deficits/detail RLE Deficits / Details: decreased WB standing    Cervical / Trunk Assessment Cervical / Trunk Assessment: Normal  Communication   Communication: No difficulties  Cognition Arousal/Alertness: Awake/alert Behavior During Therapy: WFL for tasks assessed/performed                                            General Comments      Exercises     Assessment/Plan    PT Assessment Patient needs continued PT services  PT Problem List Decreased strength;Decreased knowledge of precautions;Decreased range of motion;Decreased mobility;Decreased activity tolerance;Decreased safety awareness;Decreased knowledge of use of DME       PT Treatment Interventions DME instruction;Functional mobility training;Gait training;Therapeutic activities;Stair training;Therapeutic exercise    PT Goals (Current goals can be found in the Care Plan section)  Acute Rehab PT Goals Patient Stated Goal: go home PT Goal Formulation: With patient Time For Goal Achievement: 04/26/22 Potential to Achieve Goals: Good    Frequency 7X/week     Co-evaluation               AM-PAC PT "6 Clicks" Mobility  Outcome Measure Help needed turning from your back to your side while in a flat bed without using bedrails?: A Lot Help needed moving from lying on your back to sitting on the side of a flat bed without using bedrails?: A Lot Help needed moving to and from a bed to a chair (including a wheelchair)?: A Lot Help needed standing up from a chair using your arms (e.g., wheelchair or bedside chair)?: A Lot Help needed to walk in hospital room?: Total Help needed climbing 3-5 steps with a  railing? : Total 6 Click Score: 10    End of Session Equipment Utilized During Treatment: Gait belt Activity Tolerance: Patient limited by fatigue;Patient limited by pain Patient left: in chair;with call bell/phone within reach;with chair alarm set Nurse Communication: Mobility status;Patient requests pain meds PT Visit Diagnosis: Unsteadiness on feet (R26.81);Other symptoms and signs involving the nervous system (R29.898);Pain Pain - Right/Left: Right Pain - part of body: Hip    Time: 1629-1710 PT Time Calculation (min) (ACUTE ONLY): 41 min   Charges:   PT Evaluation $PT Eval Low Complexity: 1 Low PT Treatments $Therapeutic Activity: 8-22 mins $Self Care/Home Management: [place] Office [phone] Weekend [phone].sign   Claretha Cooper 04/19/2022, 5:17 PM

## 2022-04-19 NOTE — Anesthesia Postprocedure Evaluation (Signed)
Anesthesia Post Note  Patient: Jennifer Franco  Procedure(s) Performed: TOTAL HIP ARTHROPLASTY (Right: Hip)     Patient location during evaluation: Nursing Unit Anesthesia Type: Spinal Level of consciousness: oriented and awake and alert Pain management: pain level controlled Vital Signs Assessment: post-procedure vital signs reviewed and stable Respiratory status: spontaneous breathing and respiratory function stable Cardiovascular status: blood pressure returned to baseline and stable Postop Assessment: no headache, no backache, no apparent nausea or vomiting and patient able to bend at knees Anesthetic complications: no  No notable events documented.  Last Vitals:  Vitals:   04/19/22 1300 04/19/22 1315  BP: (!) 93/53 (!) 103/58  Pulse: (!) 52 66  Resp: 15 18  Temp:    SpO2: 98% 99%    Last Pain:  Vitals:   04/19/22 1100  TempSrc:   PainSc: 0-No pain                 Barnet Glasgow

## 2022-04-19 NOTE — Discharge Instructions (Addendum)
INSTRUCTIONS AFTER JOINT REPLACEMENT   Remove items at home which could result in a fall. This includes throw rugs or furniture in walking pathways ICE to the affected joint every three hours while awake for 30 minutes at a time, for at least the first 3-5 days, and then as needed for pain and swelling.  Continue to use ice for pain and swelling. You may notice swelling that will progress down to the foot and ankle.  This is normal after surgery.  Elevate your leg when you are not up walking on it.   Continue to use the breathing machine you got in the hospital (incentive spirometer) which will help keep your temperature down.  It is common for your temperature to cycle up and down following surgery, especially at night when you are not up moving around and exerting yourself.  The breathing machine keeps your lungs expanded and your temperature down.   DIET:  As you were doing prior to hospitalization, we recommend a well-balanced diet.  DRESSING / WOUND CARE / SHOWERING  You may shower 3 days after surgery, but keep the wounds dry during showering.  You may use an occlusive plastic wrap (Press'n Seal for example), NO SOAKING/SUBMERGING IN THE BATHTUB.  If the bandage gets wet, change with a clean dry gauze.  If the incision gets wet, pat the wound dry with a clean towel.  ACTIVITY  Increase activity slowly as tolerated, but follow the weight bearing instructions below.   No driving for 6 weeks or until further direction given by your physician.  You cannot drive while taking narcotics.  No lifting or carrying greater than 10 lbs. until further directed by your surgeon. Avoid periods of inactivity such as sitting longer than an hour when not asleep. This helps prevent blood clots.  You may return to work once you are authorized by your doctor.     WEIGHT BEARING   Weight bearing as tolerated with assist device (walker, cane, etc) as directed, use it as long as suggested by your surgeon or  therapist, typically at least 4-6 weeks.   EXERCISES  Results after joint replacement surgery are often greatly improved when you follow the exercise, range of motion and muscle strengthening exercises prescribed by your doctor. Safety measures are also important to protect the joint from further injury. Any time any of these exercises cause you to have increased pain or swelling, decrease what you are doing until you are comfortable again and then slowly increase them. If you have problems or questions, call your caregiver or physical therapist for advice.   Rehabilitation is important following a joint replacement. After just a few days of immobilization, the muscles of the leg can become weakened and shrink (atrophy).  These exercises are designed to build up the tone and strength of the thigh and leg muscles and to improve motion. Often times heat used for twenty to thirty minutes before working out will loosen up your tissues and help with improving the range of motion but do not use heat for the first two weeks following surgery (sometimes heat can increase post-operative swelling).   These exercises can be done on a training (exercise) mat, on the floor, on a table or on a bed. Use whatever works the best and is most comfortable for you.    Use music or television while you are exercising so that the exercises are a pleasant break in your day. This will make your life better with the exercises acting   as a break in your routine that you can look forward to.   Perform all exercises about fifteen times, three times per day or as directed.  You should exercise both the operative leg and the other leg as well.  Exercises include:   Quad Sets - Tighten up the muscle on the front of the thigh (Quad) and hold for 5-10 seconds.   Straight Leg Raises - With your knee straight (if you were given a brace, keep it on), lift the leg to 60 degrees, hold for 3 seconds, and slowly lower the leg.  Perform this  exercise against resistance later as your leg gets stronger.  Leg Slides: Lying on your back, slowly slide your foot toward your buttocks, bending your knee up off the floor (only go as far as is comfortable). Then slowly slide your foot back down until your leg is flat on the floor again.  Angel Wings: Lying on your back spread your legs to the side as far apart as you can without causing discomfort.  Hamstring Strength:  Lying on your back, push your heel against the floor with your leg straight by tightening up the muscles of your buttocks.  Repeat, but this time bend your knee to a comfortable angle, and push your heel against the floor.  You may put a pillow under the heel to make it more comfortable if necessary.   A rehabilitation program following joint replacement surgery can speed recovery and prevent re-injury in the future due to weakened muscles. Contact your doctor or a physical therapist for more information on knee rehabilitation.    CONSTIPATION  Constipation is defined medically as fewer than three stools per week and severe constipation as less than one stool per week.  Even if you have a regular bowel pattern at home, your normal regimen is likely to be disrupted due to multiple reasons following surgery.  Combination of anesthesia, postoperative narcotics, change in appetite and fluid intake all can affect your bowels.   YOU MUST use at least one of the following options; they are listed in order of increasing strength to get the job done.  They are all available over the counter, and you may need to use some, POSSIBLY even all of these options:    Drink plenty of fluids (prune juice may be helpful) and high fiber foods Colace 100 mg by mouth twice a day  Senokot for constipation as directed and as needed Dulcolax (bisacodyl), take with full glass of water  Miralax (polyethylene glycol) once or twice a day as needed.  If you have tried all these things and are unable to have a  bowel movement in the first 3-4 days after surgery call either your surgeon or your primary doctor.    If you experience loose stools or diarrhea, hold the medications until you stool forms back up.  If your symptoms do not get better within 1 week or if they get worse, check with your doctor.  If you experience "the worst abdominal pain ever" or develop nausea or vomiting, please contact the office immediately for further recommendations for treatment.   ITCHING:  If you experience itching with your medications, try taking only a single pain pill, or even half a pain pill at a time.  You can also use Benadryl over the counter for itching or also to help with sleep.   TED HOSE STOCKINGS:  Use stockings on both legs until for at least 2 weeks or as directed by   physician office. They may be removed at night for sleeping.  MEDICATIONS:  See your medication summary on the "After Visit Summary" that nursing will review with you.  You may have some home medications which will be placed on hold until you complete the course of blood thinner medication.  It is important for you to complete the blood thinner medication as prescribed.  PRECAUTIONS:  If you experience chest pain or shortness of breath - call 911 immediately for transfer to the hospital emergency department.   If you develop a fever greater that 101 F, purulent drainage from wound, increased redness or drainage from wound, foul odor from the wound/dressing, or calf pain - CONTACT YOUR SURGEON.                                                   FOLLOW-UP APPOINTMENTS:  If you do not already have a post-op appointment, please call the office for an appointment to be seen by your surgeon.  Guidelines for how soon to be seen are listed in your "After Visit Summary", but are typically between 1-4 weeks after surgery.  OTHER INSTRUCTIONS:   POST-OPERATIVE OPIOID TAPER INSTRUCTIONS: It is important to wean off of your opioid medication as soon as  possible. If you do not need pain medication after your surgery it is ok to stop day one. Opioids include: Codeine, Hydrocodone(Norco, Vicodin), Oxycodone(Percocet, oxycontin) and hydromorphone amongst others.  Long term and even short term use of opiods can cause: Increased pain response Dependence Constipation Depression Respiratory depression And more.  Withdrawal symptoms can include Flu like symptoms Nausea, vomiting And more Techniques to manage these symptoms Hydrate well Eat regular healthy meals Stay active Use relaxation techniques(deep breathing, meditating, yoga) Do Not substitute Alcohol to help with tapering If you have been on opioids for less than two weeks and do not have pain than it is ok to stop all together.  Plan to wean off of opioids This plan should start within one week post op of your joint replacement. Maintain the same interval or time between taking each dose and first decrease the dose.  Cut the total daily intake of opioids by one tablet each day Next start to increase the time between doses. The last dose that should be eliminated is the evening dose.   MAKE SURE YOU:  Understand these instructions.  Get help right away if you are not doing well or get worse.    Thank you for letting us be a part of your medical care team.  It is a privilege we respect greatly.  We hope these instructions will help you stay on track for a fast and full recovery!     Information on my medicine - XARELTO (Rivaroxaban)     Why was Xarelto prescribed for you? Xarelto was prescribed for you to reduce the risk of blood clots forming after orthopedic surgery. The medical term for these abnormal blood clots is venous thromboembolism (VTE).  What do you need to know about xarelto ? Take your Xarelto ONCE DAILY at the same time every day. You may take it either with or without food.  If you have difficulty swallowing the tablet whole, you may crush it and mix  in applesauce just prior to taking your dose.  Take Xarelto exactly as prescribed by your doctor and DO   NOT stop taking Xarelto without talking to the doctor who prescribed the medication.  Stopping without other VTE prevention medication to take the place of Xarelto may increase your risk of developing a clot.  After discharge, you should have regular check-up appointments with your healthcare provider that is prescribing your Xarelto.    What do you do if you miss a dose? If you miss a dose, take it as soon as you remember on the same day then continue your regularly scheduled once daily regimen the next day. Do not take two doses of Xarelto on the same day.   Important Safety Information A possible side effect of Xarelto is bleeding. You should call your healthcare provider right away if you experience any of the following: Bleeding from an injury or your nose that does not stop. Unusual colored urine (red or dark brown) or unusual colored stools (red or black). Unusual bruising for unknown reasons. A serious fall or if you hit your head (even if there is no bleeding).  Some medicines may interact with Xarelto and might increase your risk of bleeding while on Xarelto. To help avoid this, consult your healthcare provider or pharmacist prior to using any new prescription or non-prescription medications, including herbals, vitamins, non-steroidal anti-inflammatory drugs (NSAIDs) and supplements.  This website has more information on Xarelto: www.xarelto.com.   

## 2022-04-19 NOTE — Transfer of Care (Signed)
Immediate Anesthesia Transfer of Care Note  Patient: Jennifer Franco  Procedure(s) Performed: TOTAL HIP ARTHROPLASTY (Right: Hip)  Patient Location: PACU  Anesthesia Type:spinal  Level of Consciousness: awake, alert , and patient cooperative  Airway & Oxygen Therapy: Patient Spontanous Breathing and Patient connected to face mask oxygen  Post-op Assessment: Report given to RN, Post -op Vital signs reviewed and stable, and Patient moving all extremities X 4  Post vital signs: Reviewed and stable  Last Vitals:  Vitals Value Taken Time  BP 99/58 04/19/22 0953  Temp    Pulse 77 04/19/22 0955  Resp 20 04/19/22 0955  SpO2 98 % 04/19/22 0955  Vitals shown include unvalidated device data.  Last Pain:  Vitals:   04/19/22 0616  TempSrc:   PainSc: 0-No pain      Patients Stated Pain Goal: 2 (57/32/20 2542)  Complications: No notable events documented.

## 2022-04-19 NOTE — Plan of Care (Signed)
  Problem: Education: Goal: Knowledge of General Education information will improve Description Including pain rating scale, medication(s)/side effects and non-pharmacologic comfort measures Outcome: Progressing   

## 2022-04-20 ENCOUNTER — Encounter (HOSPITAL_COMMUNITY): Payer: Self-pay | Admitting: Orthopedic Surgery

## 2022-04-20 DIAGNOSIS — Z820 Family history of epilepsy and other diseases of the nervous system: Secondary | ICD-10-CM | POA: Diagnosis not present

## 2022-04-20 DIAGNOSIS — Z803 Family history of malignant neoplasm of breast: Secondary | ICD-10-CM | POA: Diagnosis not present

## 2022-04-20 DIAGNOSIS — Z86010 Personal history of colonic polyps: Secondary | ICD-10-CM | POA: Diagnosis not present

## 2022-04-20 DIAGNOSIS — Z96642 Presence of left artificial hip joint: Secondary | ICD-10-CM | POA: Diagnosis present

## 2022-04-20 DIAGNOSIS — M1611 Unilateral primary osteoarthritis, right hip: Secondary | ICD-10-CM | POA: Diagnosis present

## 2022-04-20 DIAGNOSIS — D62 Acute posthemorrhagic anemia: Secondary | ICD-10-CM | POA: Diagnosis present

## 2022-04-20 DIAGNOSIS — Z79890 Hormone replacement therapy (postmenopausal): Secondary | ICD-10-CM | POA: Diagnosis not present

## 2022-04-20 DIAGNOSIS — Z888 Allergy status to other drugs, medicaments and biological substances status: Secondary | ICD-10-CM | POA: Diagnosis not present

## 2022-04-20 DIAGNOSIS — Z9049 Acquired absence of other specified parts of digestive tract: Secondary | ICD-10-CM | POA: Diagnosis not present

## 2022-04-20 DIAGNOSIS — E039 Hypothyroidism, unspecified: Secondary | ICD-10-CM | POA: Diagnosis present

## 2022-04-20 DIAGNOSIS — M797 Fibromyalgia: Secondary | ICD-10-CM | POA: Diagnosis present

## 2022-04-20 DIAGNOSIS — Z8673 Personal history of transient ischemic attack (TIA), and cerebral infarction without residual deficits: Secondary | ICD-10-CM | POA: Diagnosis not present

## 2022-04-20 DIAGNOSIS — M069 Rheumatoid arthritis, unspecified: Secondary | ICD-10-CM | POA: Diagnosis present

## 2022-04-20 DIAGNOSIS — Z83719 Family history of colon polyps, unspecified: Secondary | ICD-10-CM | POA: Diagnosis not present

## 2022-04-20 DIAGNOSIS — Z7901 Long term (current) use of anticoagulants: Secondary | ICD-10-CM | POA: Diagnosis not present

## 2022-04-20 DIAGNOSIS — Z83511 Family history of glaucoma: Secondary | ICD-10-CM | POA: Diagnosis not present

## 2022-04-20 LAB — BASIC METABOLIC PANEL
Anion gap: 5 (ref 5–15)
BUN: 12 mg/dL (ref 8–23)
CO2: 24 mmol/L (ref 22–32)
Calcium: 8.4 mg/dL — ABNORMAL LOW (ref 8.9–10.3)
Chloride: 112 mmol/L — ABNORMAL HIGH (ref 98–111)
Creatinine, Ser: 0.51 mg/dL (ref 0.44–1.00)
GFR, Estimated: >60 mL/min (ref >60)
Glucose, Bld: 116 mg/dL — ABNORMAL HIGH (ref 70–99)
Potassium: 3.8 mmol/L (ref 3.5–5.1)
Sodium: 141 mmol/L (ref 135–145)

## 2022-04-20 LAB — CBC
HCT: 23.8 % — ABNORMAL LOW (ref 36.0–46.0)
Hemoglobin: 7.6 g/dL — ABNORMAL LOW (ref 12.0–15.0)
MCH: 28.5 pg (ref 26.0–34.0)
MCHC: 31.9 g/dL (ref 30.0–36.0)
MCV: 89.1 fL (ref 80.0–100.0)
Platelets: 193 10*3/uL (ref 150–400)
RBC: 2.67 MIL/uL — ABNORMAL LOW (ref 3.87–5.11)
RDW: 14.3 % (ref 11.5–15.5)
WBC: 6.8 10*3/uL (ref 4.0–10.5)
nRBC: 0 % (ref 0.0–0.2)

## 2022-04-20 MED ORDER — SODIUM CHLORIDE 0.9 % IV BOLUS
500.0000 mL | Freq: Once | INTRAVENOUS | Status: AC
  Administered 2022-04-20: 500 mL via INTRAVENOUS

## 2022-04-20 NOTE — Progress Notes (Signed)
Physical Therapy Treatment Patient Details Name: Jennifer Franco MRN: [id number] DOB: 02-13-1956 Today's Date: 04/20/2022   History of Present Illness 66 yo female S/P  R THA-posterior approach. PMH: R THA  10/23, angina, fibromyalgia,hypothyroid, RA, SJogren's syndrome < TIA    PT Comments    POD # 1 am session General Comments: AxO x 3 very pleasant Lady.  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  Pt also had "other" Hip replaced just recently on 02/15/2022 Assisted OOB to amb in hallway required increased time and effort.  General bed mobility comments: demonstarted and instructed how to use a belt to self asisst LE.  Required increased time. General transfer comment: 25% VC's on proper hand placement as well as safety with turns.  Also required increased time. General Gait Details: 25% VC's on proper sequencing as safety with turns. Then returned to room to perform some TE's following HEP handout.  Instructed on proper tech, freq as well as use of ICE.   Pt will need another PT session to increase her mobility safety and practice stairs.   Recommendations for follow up therapy are one component of a multi-disciplinary discharge planning process, led by the attending physician.  Recommendations may be updated based on patient status, additional functional criteria and insurance authorization.  Follow Up Recommendations  Follow physician's recommendations for discharge plan and follow up therapies Can patient physically be transported by private vehicle: Yes   Assistance Recommended at Discharge Intermittent Supervision/Assistance  Patient can return home with the following A little help with walking and/or transfers;A little help with bathing/dressing/bathroom;Assistance with cooking/housework;Assist for transportation;Help with stairs or ramp for entrance   Equipment Recommendations  None recommended by PT    Recommendations for Other Services       Precautions  / Restrictions Precautions Precautions: Fall Restrictions Weight Bearing Restrictions: No RLE Weight Bearing: Weight bearing as tolerated     Mobility  Bed Mobility Overal bed mobility: Needs Assistance Bed Mobility: Supine to Sit     Supine to sit: Min assist     General bed mobility comments: demonstarted and instructed how to use a belt to self asisst LE.  Required increased time.    Transfers Overall transfer level: Needs assistance Equipment used: Rolling walker (2 wheels) Transfers: Sit to/from Stand Sit to Stand: Min assist, Min guard           General transfer comment: 25% VC's on proper hand placement as well as safety with turns.  Also required increased time.    Ambulation/Gait Ambulation/Gait assistance: Min guard, Min assist Gait Distance (Feet): 37 Feet Assistive device: Rolling walker (2 wheels) Gait Pattern/deviations: Step-to pattern, Step-through pattern, Decreased stance time - right Gait velocity: decreased     General Gait Details: 25% VC's on proper sequencing as safety with turns.   Stairs             Wheelchair Mobility    Modified Rankin (Stroke Patients Only)       Balance                                            Cognition Arousal/Alertness: Awake/alert Behavior During Therapy: WFL for tasks assessed/performed Overall Cognitive Status: Within Functional Limits for tasks assessed  General Comments: AxO x 3 very pleasant Lady        Exercises   Total Hip Replacement TE's following HEP Handout 10 reps ankle pumps 05 reps knee presses 05 reps heel slides 05 reps SAQ's 05 reps ABD Instructed how to use a belt loop to assist  Followed by ICE    General Comments        Pertinent Vitals/Pain Pain Assessment Pain Assessment: 0-10 Pain Score: 7  Pain Location: right hip Pain Descriptors / Indicators: Aching, Cramping, Grimacing, Guarding,  Operative site guarding Pain Intervention(s): Monitored during session, Premedicated before session, Repositioned, Ice applied    Home Living                          Prior Function            PT Goals (current goals can now be found in the care plan section) Progress towards PT goals: Progressing toward goals    Frequency    7X/week      PT Plan Current plan remains appropriate    Co-evaluation              AM-PAC PT "6 Clicks" Mobility   Outcome Measure  Help needed turning from your back to your side while in a flat bed without using bedrails?: A Little Help needed moving from lying on your back to sitting on the side of a flat bed without using bedrails?: A Little Help needed moving to and from a bed to a chair (including a wheelchair)?: A Little Help needed standing up from a chair using your arms (e.g., wheelchair or bedside chair)?: A Little Help needed to walk in hospital room?: A Little Help needed climbing 3-5 steps with a railing? : A Lot 6 Click Score: 17    End of Session Equipment Utilized During Treatment: Gait belt Activity Tolerance: Patient tolerated treatment well;Patient limited by fatigue Patient left: in chair;with call bell/phone within reach;with chair alarm set Nurse Communication: Mobility status;Patient requests pain meds PT Visit Diagnosis: Unsteadiness on feet (R26.81);Other symptoms and signs involving the nervous system (R29.898);Pain Pain - Right/Left: Right Pain - part of body: Hip     Time: 1114-1140 PT Time Calculation (min) (ACUTE ONLY): 26 min  Charges:  $Gait Training: 8-22 mins $Therapeutic Exercise: 8-22 mins                     Rica Koyanagi  PTA [facility] M-F          [phone] Weekend pager [phone]

## 2022-04-20 NOTE — Progress Notes (Signed)
Subjective: 1 Day Post-Op s/p Procedure(s): TOTAL HIP ARTHROPLASTY   Patient is alert, oriented.  Reports pain as well controlled so far with hydrocodone. Voiding well this  morning after removal of foley, no bm yet. No dizziness or lightheadedness. Denies chest pain, SOB, Calf pain. No nausea/vomiting. No other complaints.  Objective:  PE: VITALS:   Vitals:   04/19/22 1745 04/19/22 2204 04/20/22 0154 04/20/22 0556  BP: 120/73 104/63 (!) 102/54 101/64  Pulse: (!) 59 (!) 54 (!) 52 (!) 55  Resp: '18 17 17 17  '$ Temp: 98.5 F (36.9 C) 98.4 F (36.9 C) 99.1 F (37.3 C) 99 F (37.2 C)  TempSrc:      SpO2: 100% 100% 99% 100%  Weight:      Height:       Normal respiratory effort ABD soft Sensation intact distally Intact pulses distally Dorsiflexion/Plantar flexion intact Incision: dressing C/D/I  LABS  Results for orders placed or performed during the hospital encounter of 04/19/22 (from the past 24 hour(s))  CBC     Status: Abnormal   Collection Time: 04/20/22  3:27 AM  Result Value Ref Range   WBC 6.8 4.0 - 10.5 K/uL   RBC 2.67 (L) 3.87 - 5.11 MIL/uL   Hemoglobin 7.6 (L) 12.0 - 15.0 g/dL   HCT 23.8 (L) 36.0 - 46.0 %   MCV 89.1 80.0 - 100.0 fL   MCH 28.5 26.0 - 34.0 pg   MCHC 31.9 30.0 - 36.0 g/dL   RDW 14.3 11.5 - 15.5 %   Platelets 193 150 - 400 K/uL   nRBC 0.0 0.0 - 0.2 %  Basic metabolic panel     Status: Abnormal   Collection Time: 04/20/22  3:27 AM  Result Value Ref Range   Sodium 141 135 - 145 mmol/L   Potassium 3.8 3.5 - 5.1 mmol/L   Chloride 112 (H) 98 - 111 mmol/L   CO2 24 22 - 32 mmol/L   Glucose, Bld 116 (H) 70 - 99 mg/dL   BUN 12 8 - 23 mg/dL   Creatinine, Ser 0.51 0.44 - 1.00 mg/dL   Calcium 8.4 (L) 8.9 - 10.3 mg/dL   GFR, Estimated >60 >60 mL/min   Anion gap 5 5 - 15    DG Hip Port Unilat With Pelvis 1V Right  Result Date: 04/19/2022 CLINICAL DATA:  Status post right hip arthroplasty EXAM: DG HIP (WITH OR WITHOUT PELVIS) 1V PORT RIGHT  COMPARISON:  Pelvis and left hip radiographs 02/15/2022 FINDINGS: Resolution of the prior postoperative air around the total left hip arthroplasty hardware. New total right hip arthroplasty with expected lateral right hip recent postsurgical subcutaneous air and right femoroacetabular intra-articular air. No perihardware lucency is seen to indicate hardware failure or loosening. Mild bilateral sacroiliac joint subchondral sclerosis. Mild superior pubic symphysis subchondral sclerosis. Surgical suture overlies the mid pelvis. Right hemipelvis surgical clip. IMPRESSION: New total right hip arthroplasty without evidence of hardware failure. Electronically Signed   By: Yvonne Kendall M.D.   On: 04/19/2022 10:54    Assessment/Plan: Right hip osteoarthritis 1 Day Post-Op s/p Procedure(s): TOTAL HIP ARTHROPLASTY  Acute blood loss anemia: - Hbg dropped 11.8>7.6 post-op, no outward signs of bleeding on bandages. Will watch this, patient already supplementing with ferrous sulfate. So far asymptomatic.   Weightbearing: WBAT RLE,  Insicional and dressing care: Reinforce dressings as needed VTE prophylaxis: plan to discharge home on Xarelto, held today due to [place]. Pain control: continue current regimen Follow - up  plan: 2 weeks with Dr. Mardelle Matte Dispo: pending progress with PT today.  Contact information:   Merlene Pulling, Hershal Coria [id number] 8-5  After hours and holidays please check [email or web address] for group call information for [facility]  Ventura Bruns 04/20/2022, 8:45 AM

## 2022-04-20 NOTE — Progress Notes (Signed)
Physical Therapy Treatment Patient Details Name: Jennifer Franco MRN: [id number] DOB: Apr 24, 1956 Today's Date: 04/20/2022   History of Present Illness 66 yo female S/P  R THA-posterior approach. PMH: R THA  10/23, angina, fibromyalgia,hypothyroid, RA, SJogren's syndrome < TIA    PT Comments    POD # 1 pm session Daughter present during session.  Assisted with amb an increased distance in hallway and attempted stair training.  General stair comments: Pt has 6 steps to enter her home then another 1/2 flight to get down to her bedroom (split level).  Pt was only able to navigate 2 steps with [facility] difficulty at 75% VC's on proper sequencing.  Pt just recenly had "other" hip replaced and was getting confused on which leg was going to lead.  Pt did NOT pass her stair training. Pt will need another day to ensure a safe D/C to home.   Recommendations for follow up therapy are one component of a multi-disciplinary discharge planning process, led by the attending physician.  Recommendations may be updated based on patient status, additional functional criteria and insurance authorization.  Follow Up Recommendations  Follow physician's recommendations for discharge plan and follow up therapies Can patient physically be transported by private vehicle: Yes   Assistance Recommended at Discharge Intermittent Supervision/Assistance  Patient can return home with the following A little help with walking and/or transfers;A little help with bathing/dressing/bathroom;Assistance with cooking/housework;Assist for transportation;Help with stairs or ramp for entrance   Equipment Recommendations  None recommended by PT    Recommendations for Other Services       Precautions / Restrictions Precautions Precautions: Fall Restrictions Weight Bearing Restrictions: No RLE Weight Bearing: Weight bearing as tolerated     Mobility  Bed Mobility Overal bed mobility: Needs Assistance Bed Mobility: Supine  to Sit     Supine to sit: Min assist     General bed mobility comments: OOB in recliner    Transfers Overall transfer level: Needs assistance Equipment used: Rolling walker (2 wheels) Transfers: Sit to/from Stand Sit to Stand: Min assist, Min guard           General transfer comment: 25% VC's on proper hand placement as well as safety with turns.  Also required increased time.    Ambulation/Gait Ambulation/Gait assistance: Min guard, Min assist Gait Distance (Feet): 48 Feet Assistive device: Rolling walker (2 wheels) Gait Pattern/deviations: Step-to pattern, Step-through pattern, Decreased stance time - right Gait velocity: decreased     General Gait Details: with Daughter "hands on" using safety belt.   Stairs Stairs: Yes Stairs assistance: Mod assist Stair Management: One rail Right, Step to pattern, Forwards Number of Stairs: 2 General stair comments: Pt has 6 steps to enter her home then another 1/2 flight to get down to her bedroom (split level).  Pt was only able to navigate 2 steps with [facility] difficulty at 75% VC's on proper sequencing.  Pt just recenly had "other" hip replaced and was getting confused on which leg was going to lead.  Pt did NOT pass her stair training.   Wheelchair Mobility    Modified Rankin (Stroke Patients Only)       Balance                                            Cognition Arousal/Alertness: Awake/alert Behavior During Therapy: WFL for tasks assessed/performed Overall Cognitive Status: Within  Functional Limits for tasks assessed                                 General Comments: AxO x 3 very pleasant Lady        Exercises  05 reps all standing TE's following HEP handout    General Comments        Pertinent Vitals/Pain Pain Assessment Pain Assessment: 0-10 Pain Score: 7  Pain Location: right hip Pain Descriptors / Indicators: Aching, Cramping, Grimacing, Guarding, Operative site  guarding Pain Intervention(s): Monitored during session, Premedicated before session, Repositioned, Ice applied    Home Living                          Prior Function            PT Goals (current goals can now be found in the care plan section) Progress towards PT goals: Progressing toward goals    Frequency    7X/week      PT Plan Current plan remains appropriate    Co-evaluation              AM-PAC PT "6 Clicks" Mobility   Outcome Measure  Help needed turning from your back to your side while in a flat bed without using bedrails?: A Little Help needed moving from lying on your back to sitting on the side of a flat bed without using bedrails?: A Little Help needed moving to and from a bed to a chair (including a wheelchair)?: A Little Help needed standing up from a chair using your arms (e.g., wheelchair or bedside chair)?: A Little Help needed to walk in hospital room?: A Little Help needed climbing 3-5 steps with a railing? : A Lot 6 Click Score: 17    End of Session Equipment Utilized During Treatment: Gait belt Activity Tolerance: Patient tolerated treatment well;Patient limited by fatigue Patient left: in chair;with call bell/phone within reach;with chair alarm set Nurse Communication: Mobility status;Patient requests pain meds PT Visit Diagnosis: Unsteadiness on feet (R26.81);Other symptoms and signs involving the nervous system (R29.898);Pain Pain - Right/Left: Right Pain - part of body: Hip     Time: 1530-1555 PT Time Calculation (min) (ACUTE ONLY): 25 min  Charges:  $Gait Training: 8-22 mins $Therapeutic Activity: 8-22 mins                     Rica Koyanagi  PTA [place] Office M-F          [phone] Weekend pager [phone]

## 2022-04-20 NOTE — TOC Transition Note (Signed)
Transition of Care [facility]) - CM/SW Discharge Note   Patient Details  Name: KADEJA GRANADA MRN: [id number] Date of Birth: [birth year]  Transition of Care [facility] CM/SW Contact:  Lennart Pall, LCSW Phone Number: 04/20/2022, 9:23 AM   Clinical Narrative:     Met briefly with pt and confirming she has needed DME at home.  HHPT prearranged with [facility].  No TOC needs.  Final next level of care: [place] Barriers to Discharge: No Barriers Identified   Patient Goals and CMS Choice Patient states their goals for this hospitalization and ongoing recovery are:: return home      Discharge Placement                       Discharge Plan and Services                DME Arranged: N/A DME Agency: NA       HH Arranged: PT [place] Agency: [place]        Social Determinants of Health (SDOH) Interventions     Readmission Risk Interventions     No data to display

## 2022-04-21 LAB — CBC
HCT: 24.2 % — ABNORMAL LOW (ref 36.0–46.0)
Hemoglobin: 7.7 g/dL — ABNORMAL LOW (ref 12.0–15.0)
MCH: 28.4 pg (ref 26.0–34.0)
MCHC: 31.8 g/dL (ref 30.0–36.0)
MCV: 89.3 fL (ref 80.0–100.0)
Platelets: 202 10*3/uL (ref 150–400)
RBC: 2.71 MIL/uL — ABNORMAL LOW (ref 3.87–5.11)
RDW: 14.6 % (ref 11.5–15.5)
WBC: 8 10*3/uL (ref 4.0–10.5)
nRBC: 0 % (ref 0.0–0.2)

## 2022-04-21 NOTE — Plan of Care (Signed)

## 2022-04-21 NOTE — Discharge Summary (Signed)
Discharge Summary  Patient ID: Jennifer Franco MRN: [id number] DOB/AGE: August 15, 1955 66 y.o.  Admit date: 04/19/2022 Discharge date: 04/21/2022  Admission Diagnoses:  S/P total right hip arthroplasty  Discharge Diagnoses:  Principal Problem:   S/P total right hip arthroplasty Active Problems:   S/P hip replacement, right   Acute blood loss anemia   Past Medical History:  Diagnosis Date   Anemia    Anginal pain (HCC)    anxiety   Anxiety    Cancer (HCC)    bladder   Chills    CTS (carpal tunnel syndrome)    LEFT   Cyclical vomiting    Depression    Diarrhea, unspecified    Diverticulitis    Fibromyalgia    Hemorrhoids    History of adenomatous polyp of colon    History of morbid obesity    Hypothyroid    Hypothyroidism    Intractable nausea and vomiting    Irregular bowel habits    Joint pain    LLQ abdominal pain    Morbid obesity (HCC)    Nausea and vomiting    Onychomycosis    RA (rheumatoid arthritis) ([place])    Reflux esophagitis    Reflux esophagitis    Sjogren's syndrome ([place])    TIA (transient ischemic attack)    2 within 2022-2023   Tinea corporis    Unspecified mood (affective) disorder ([place])    Weight loss     Surgeries: Procedure(s): TOTAL HIP ARTHROPLASTY on 04/19/2022   Consultants (if any):   Discharged Condition: Improved  Hospital Course: Jennifer Franco is an 66 y.o. female who was admitted 04/19/2022 with a diagnosis of S/P total right hip arthroplasty and went to the operating room on 04/19/2022 and underwent the above named procedures.    She was given perioperative antibiotics:  Anti-infectives (From admission, onward)    Start     Dose/Rate Route Frequency Ordered Stop   04/19/22 1430  ceFAZolin (ANCEF) IVPB 1 g/50 mL premix        1 g 100 mL/hr over 30 Minutes Intravenous Every 6 hours 04/19/22 1342 04/20/22 0005   04/19/22 0600  ceFAZolin (ANCEF) IVPB 2g/100 mL premix        2 g 200 mL/hr over 30 Minutes  Intravenous On call to O.R. 04/19/22 8413 04/19/22 0740     .  She was given sequential compression devices, early ambulation, and Xarelto for DVT prophylaxis.  She benefited maximally from the hospital stay and there were no complications.    Recent vital signs:  Vitals:   04/20/22 2151 04/21/22 0600  BP: 111/68 109/64  Pulse: 63 67  Resp: 17 17  Temp: 98.7 F (37.1 C) 98.9 F (37.2 C)  SpO2: 100% 100%    Recent laboratory studies:  Lab Results  Component Value Date   HGB 7.7 (L) 04/21/2022   HGB 7.6 (L) 04/20/2022   HGB 11.8 (L) 04/08/2022   Lab Results  Component Value Date   WBC 8.0 04/21/2022   PLT 202 04/21/2022   Lab Results  Component Value Date   INR 0.9 07/24/2019   Lab Results  Component Value Date   NA 141 04/20/2022   K 3.8 04/20/2022   CL 112 (H) 04/20/2022   CO2 24 04/20/2022   BUN 12 04/20/2022   CREATININE 0.51 04/20/2022   GLUCOSE 116 (H) 04/20/2022    Discharge Medications:   Allergies as of 04/21/2022       Reactions   Etodolac  Diarrhea   Topamax [topiramate] Other (See Comments)   Brain flutters         Medication List     TAKE these medications    atorvastatin 20 MG tablet Commonly known as: Lipitor Take 1 tablet (20 mg total) by mouth daily.   baclofen 10 MG tablet Commonly known as: LIORESAL Take 1 tablet (10 mg total) by mouth 3 (three) times daily. As needed for muscle spasm   Calcium 600 + D 600-5 MG-MCG Tabs Generic drug: Calcium Carb-Cholecalciferol Take 1,000 Units by mouth daily.   cyanocobalamin 1000 MCG tablet Commonly known as: VITAMIN B12 Take 1,000 mcg by mouth every other day.   ferrous sulfate 325 (65 FE) MG tablet Take 25 mg by mouth daily with breakfast.   folic acid 1 MG tablet Commonly known as: FOLVITE Take 1 mg by mouth daily.   HYDROcodone-acetaminophen 10-325 MG tablet Commonly known as: Norco Take 1 tablet by mouth every 6 (six) hours as needed. What changed: reasons to take this    hydroxychloroquine 200 MG tablet Commonly known as: PLAQUENIL Take 200 mg by mouth daily.   levothyroxine 112 MCG tablet Commonly known as: SYNTHROID Take 125 mcg by mouth daily.   ondansetron 4 MG tablet Commonly known as: Zofran Take 1 tablet (4 mg total) by mouth every 8 (eight) hours as needed for nausea or vomiting.   rivaroxaban 10 MG Tabs tablet Commonly known as: Xarelto Take 1 tablet (10 mg total) by mouth daily.   sennosides-docusate sodium 8.6-50 MG tablet Commonly known as: SENOKOT-S Take 2 tablets by mouth daily.   sertraline 25 MG tablet Commonly known as: Zoloft Take 1 tablet (25 mg total) by mouth daily.   VITAMIN D PO Take 1 tablet by mouth daily.        Diagnostic Studies: DG Hip Port Unilat With Pelvis 1V Right  Result Date: 04/19/2022 CLINICAL DATA:  Status post right hip arthroplasty EXAM: DG HIP (WITH OR WITHOUT PELVIS) 1V PORT RIGHT COMPARISON:  Pelvis and left hip radiographs 02/15/2022 FINDINGS: Resolution of the prior postoperative air around the total left hip arthroplasty hardware. New total right hip arthroplasty with expected lateral right hip recent postsurgical subcutaneous air and right femoroacetabular intra-articular air. No perihardware lucency is seen to indicate hardware failure or loosening. Mild bilateral sacroiliac joint subchondral sclerosis. Mild superior pubic symphysis subchondral sclerosis. Surgical suture overlies the mid pelvis. Right hemipelvis surgical clip. IMPRESSION: New total right hip arthroplasty without evidence of hardware failure. Electronically Signed   By: Yvonne Kendall M.D.   On: 04/19/2022 10:54    Disposition: Discharge disposition: 06-Home-Health Care Svc          Follow-up Information     Marchia Bond, MD. Go on 04/29/2022.   Specialty: Orthopedic Surgery Why: Your appointment is scheduled for 11:15. Contact information: [address] [place] [address] [phone]          [facility] [place] Follow up.   Specialty: [place] Why: HHPT will see you for 6 home visits prior to starting outpatient physical therapy Contact information: [address]  [place] [address] [phone]         [facility], [place]. Go on 04/29/2022.   Why: your outpatient physical therapy is scheduled for 10:15. Please arrive at 10:00 to complete your paperwork Contact information: [facility] [place] [address] [phone]  Signed: Ventura Bruns PA-C 04/21/2022, 1:15 PM

## 2022-04-21 NOTE — Plan of Care (Signed)
Problem: Education: Goal: Knowledge of General Education information will improve Description: Including pain rating scale, medication(s)/side effects and non-pharmacologic comfort measures 04/21/2022 1014 by Aarilyn Dye L, RN Outcome: Adequate for Discharge 04/21/2022 0741 by Detrich Rakestraw L, RN Outcome: Progressing   Problem: Health Behavior/Discharge Planning: Goal: Ability to manage health-related needs will improve 04/21/2022 1014 by Kalyn Hofstra L, RN Outcome: Adequate for Discharge 04/21/2022 0741 by Mckena Chern L, RN Outcome: Progressing   Problem: Clinical Measurements: Goal: Ability to maintain clinical measurements within normal limits will improve 04/21/2022 1014 by Riko Lumsden L, RN Outcome: Adequate for Discharge 04/21/2022 0741 by Shaelin Lalley L, RN Outcome: Progressing Goal: Will remain free from infection 04/21/2022 1014 by Danajah Birdsell L, RN Outcome: Adequate for Discharge 04/21/2022 0741 by Eustace Hur L, RN Outcome: Progressing Goal: Diagnostic test results will improve 04/21/2022 1014 by Kourtnee Lahey L, RN Outcome: Adequate for Discharge 04/21/2022 0741 by Addysen Louth L, RN Outcome: Progressing Goal: Respiratory complications will improve 04/21/2022 1014 by Dayshon Roback L, RN Outcome: Adequate for Discharge 04/21/2022 0741 by Brittnay Pigman L, RN Outcome: Progressing Goal: Cardiovascular complication will be avoided 04/21/2022 1014 by Lavaughn Haberle L, RN Outcome: Adequate for Discharge 04/21/2022 0741 by Brandis Matsuura L, RN Outcome: Progressing   Problem: Activity: Goal: Risk for activity intolerance will decrease 04/21/2022 1014 by Sharica Roedel L, RN Outcome: Adequate for Discharge 04/21/2022 0741 by Torey Regan L, RN Outcome: Progressing   Problem: Nutrition: Goal: Adequate nutrition will be maintained 04/21/2022 1014 by Tyreke Kaeser L, RN Outcome: Adequate for Discharge 04/21/2022 0741 by Layth Cerezo L, RN Outcome:  Progressing   Problem: Coping: Goal: Level of anxiety will decrease 04/21/2022 1014 by Tracie Dore L, RN Outcome: Adequate for Discharge 04/21/2022 0741 by Mieko Kneebone L, RN Outcome: Progressing   Problem: Elimination: Goal: Will not experience complications related to bowel motility 04/21/2022 1014 by Canary Fister L, RN Outcome: Adequate for Discharge 04/21/2022 0741 by Akia Montalban L, RN Outcome: Progressing Goal: Will not experience complications related to urinary retention 04/21/2022 1014 by Melessia Kaus L, RN Outcome: Adequate for Discharge 04/21/2022 0741 by Felina Tello L, RN Outcome: Progressing   Problem: Pain Managment: Goal: General experience of comfort will improve 04/21/2022 1014 by Alvita Fana L, RN Outcome: Adequate for Discharge 04/21/2022 0741 by Frances Ambrosino L, RN Outcome: Progressing   Problem: Safety: Goal: Ability to remain free from injury will improve 04/21/2022 1014 by Noah Pelaez L, RN Outcome: Adequate for Discharge 04/21/2022 0741 by Jaylan Hinojosa L, RN Outcome: Progressing   Problem: Skin Integrity: Goal: Risk for impaired skin integrity will decrease 04/21/2022 1014 by Imran Nuon L, RN Outcome: Adequate for Discharge 04/21/2022 0741 by Lorilee Cafarella L, RN Outcome: Progressing   Problem: Education: Goal: Knowledge of the prescribed therapeutic regimen will improve 04/21/2022 1014 by Lenix Kidd L, RN Outcome: Adequate for Discharge 04/21/2022 0741 by Kelie Gainey L, RN Outcome: Progressing Goal: Understanding of discharge needs will improve 04/21/2022 1014 by Helma Argyle L, RN Outcome: Adequate for Discharge 04/21/2022 0741 by Kamrin Spath L, RN Outcome: Progressing Goal: Individualized Educational Video(s) 04/21/2022 1014 by Hinata Diener L, RN Outcome: Adequate for Discharge 04/21/2022 0741 by Poet Hineman L, RN Outcome: Progressing   Problem: Activity: Goal: Ability to avoid complications of mobility  impairment will improve 04/21/2022 1014 by Marcio Hoque L, RN Outcome: Adequate for Discharge 04/21/2022 0741 by Toan Mort L, RN Outcome: Progressing Goal: Ability to tolerate increased activity will improve 04/21/2022 1014 by Elvyn Krohn L, RN  Outcome: Adequate for Discharge 04/21/2022 0741 by Muna Demers L, RN Outcome: Progressing   Problem: Clinical Measurements: Goal: Postoperative complications will be avoided or minimized 04/21/2022 1014 by Breniya Goertzen L, RN Outcome: Adequate for Discharge 04/21/2022 0741 by Robby Bulkley L, RN Outcome: Progressing   Problem: Pain Management: Goal: Pain level will decrease with appropriate interventions 04/21/2022 1014 by Miro Balderson L, RN Outcome: Adequate for Discharge 04/21/2022 0741 by Keiri Solano L, RN Outcome: Progressing   Problem: Skin Integrity: Goal: Will show signs of wound healing 04/21/2022 1014 by Devonta Blanford L, RN Outcome: Adequate for Discharge 04/21/2022 0741 by Lukasz Rogus L, RN Outcome: Progressing

## 2022-04-21 NOTE — Progress Notes (Signed)
Physical Therapy Treatment Patient Details Name: JHERI MITTER MRN: [id number] DOB: 09-11-1955 Today's Date: 04/21/2022   History of Present Illness 66 yo female S/P  R THA-posterior approach. PMH: R THA  10/23, angina, fibromyalgia,hypothyroid, RA, SJogren's syndrome < TIA    PT Comments    POD # 2 am session Pt feeling "much better" today.  Pt self able to rise OOB and amb to bathroom.  "Look what I can do".  Pt tolerated an increased distance amb in hallway and performed stairs much better.  "I have to be able to continue to care for my mother who has dementia".  Then returned to room to perform some TE's following HEP handout.  Instructed on proper tech, freq as well as use of ICE.   Addressed all mobility questions, discussed appropriate activity, educated on use of ICE.  Pt ready for D/C to home.   Recommendations for follow up therapy are one component of a multi-disciplinary discharge planning process, led by the attending physician.  Recommendations may be updated based on patient status, additional functional criteria and insurance authorization.  Follow Up Recommendations  Follow physician's recommendations for discharge plan and follow up therapies Can patient physically be transported by private vehicle: Yes   Assistance Recommended at Discharge Intermittent Supervision/Assistance  Patient can return home with the following A little help with walking and/or transfers;A little help with bathing/dressing/bathroom;Assistance with cooking/housework;Assist for transportation;Help with stairs or ramp for entrance   Equipment Recommendations  None recommended by PT    Recommendations for Other Services       Precautions / Restrictions Precautions Precautions: Fall Restrictions Weight Bearing Restrictions: No RLE Weight Bearing: Weight bearing as tolerated     Mobility  Bed Mobility Overal bed mobility: Needs Assistance Bed Mobility: Supine to Sit            General bed mobility comments: pt self able "look at what I can do"    Transfers Overall transfer level: Needs assistance Equipment used: Rolling walker (2 wheels) Transfers: Sit to/from Stand Sit to Stand: Supervision           General transfer comment: good safety cognition and use of hands to steady self.  Also self able to safely perform a toilet transfer.    Ambulation/Gait Ambulation/Gait assistance: Supervision Gait Distance (Feet): 87 Feet Assistive device: Rolling walker (2 wheels) Gait Pattern/deviations: Step-to pattern, Step-through pattern, Decreased stance time - right Gait velocity: decreased     General Gait Details: tolerated amb an increased distance and present with an increased stride length as well as increased balance with turns.   Stairs Stairs: Yes Stairs assistance: Supervision, Min guard Stair Management: One rail Right, Step to pattern, Forwards Number of Stairs: 4 General stair comments: much improved ability to safely navigate the stairs today with increased balance   Wheelchair Mobility    Modified Rankin (Stroke Patients Only)       Balance                                            Cognition Arousal/Alertness: Awake/alert Behavior During Therapy: WFL for tasks assessed/performed Overall Cognitive Status: Within Functional Limits for tasks assessed                                 General Comments: AxO x  3 very pleasant Lady feeling much better "I slept more last night".        Exercises  Total Hip Replacement TE's following HEP Handout 10 reps ankle pumps 05 reps knee presses 05 reps heel slides 05 reps SAQ's 05 reps ABD Instructed how to use a belt loop to assist  Followed by ICE     General Comments        Pertinent Vitals/Pain Pain Assessment Pain Assessment: 0-10 Pain Score: 4  Pain Location: right hip Pain Descriptors / Indicators: Aching, Cramping, Grimacing, Guarding,  Operative site guarding Pain Intervention(s): Monitored during session, Premedicated before session, Repositioned, Ice applied    Home Living                          Prior Function            PT Goals (current goals can now be found in the care plan section) Progress towards PT goals: Progressing toward goals    Frequency    7X/week      PT Plan Current plan remains appropriate    Co-evaluation              AM-PAC PT "6 Clicks" Mobility   Outcome Measure  Help needed turning from your back to your side while in a flat bed without using bedrails?: None Help needed moving from lying on your back to sitting on the side of a flat bed without using bedrails?: None Help needed moving to and from a bed to a chair (including a wheelchair)?: None Help needed standing up from a chair using your arms (e.g., wheelchair or bedside chair)?: None Help needed to walk in hospital room?: None Help needed climbing 3-5 steps with a railing? : A Little 6 Click Score: 23    End of Session Equipment Utilized During Treatment: Gait belt Activity Tolerance: Patient tolerated treatment well Patient left: in chair;with call bell/phone within reach;with chair alarm set Nurse Communication: Mobility status;Patient requests pain meds PT Visit Diagnosis: Unsteadiness on feet (R26.81);Other symptoms and signs involving the nervous system (R29.898);Pain Pain - Right/Left: Right Pain - part of body: Hip     Time: 7517-0017 PT Time Calculation (min) (ACUTE ONLY): 26 min  Charges:  $Gait Training: 8-22 mins $Therapeutic Activity: 8-22 mins                     Rica Koyanagi  PTA Acute  Rehabilitation Services Office M-F          [phone] Weekend pager [phone]

## 2022-04-21 NOTE — Progress Notes (Signed)
     Subjective: 2 Days Post-Op s/p Procedure(s): TOTAL HIP ARTHROPLASTY   Patient is alert, oriented.  Reports pain as well controlled so far with hydrocodone. Voiding well this morning. No dizziness or lightheadedness. Was happy that she stayed overnight for more time with PT. Denies chest pain, SOB, Calf pain. No nausea/vomiting. No other complaints.  Objective:  PE: VITALS:   Vitals:   04/20/22 1056 04/20/22 1452 04/20/22 2151 04/21/22 0600  BP: (!) 116/55 127/60 111/68 109/64  Pulse: 60 61 63 67  Resp: '18 18 17 17  '$ Temp: 98.1 F (36.7 C) 98.5 F (36.9 C) 98.7 F (37.1 C) 98.9 F (37.2 C)  TempSrc:      SpO2: 100% 99% 100% 100%  Weight:      Height:       Normal respiratory effort ABD soft Sensation intact distally Intact pulses distally Dorsiflexion/Plantar flexion intact Incision: dressing C/D/I  LABS  Results for orders placed or performed during the hospital encounter of 04/19/22 (from the past 24 hour(s))  CBC     Status: Abnormal   Collection Time: 04/21/22  3:35 AM  Result Value Ref Range   WBC 8.0 4.0 - 10.5 K/uL   RBC 2.71 (L) 3.87 - 5.11 MIL/uL   Hemoglobin 7.7 (L) 12.0 - 15.0 g/dL   HCT 24.2 (L) 36.0 - 46.0 %   MCV 89.3 80.0 - 100.0 fL   MCH 28.4 26.0 - 34.0 pg   MCHC 31.8 30.0 - 36.0 g/dL   RDW 14.6 11.5 - 15.5 %   Platelets 202 150 - 400 K/uL   nRBC 0.0 0.0 - 0.2 %    DG Hip Port Unilat With Pelvis 1V Right  Result Date: 04/19/2022 CLINICAL DATA:  Status post right hip arthroplasty EXAM: DG HIP (WITH OR WITHOUT PELVIS) 1V PORT RIGHT COMPARISON:  Pelvis and left hip radiographs 02/15/2022 FINDINGS: Resolution of the prior postoperative air around the total left hip arthroplasty hardware. New total right hip arthroplasty with expected lateral right hip recent postsurgical subcutaneous air and right femoroacetabular intra-articular air. No perihardware lucency is seen to indicate hardware failure or loosening. Mild bilateral sacroiliac joint  subchondral sclerosis. Mild superior pubic symphysis subchondral sclerosis. Surgical suture overlies the mid pelvis. Right hemipelvis surgical clip. IMPRESSION: New total right hip arthroplasty without evidence of hardware failure. Electronically Signed   By: Yvonne Kendall M.D.   On: 04/19/2022 10:54    Assessment/Plan: Right hip osteoarthritis 2 Days Post-Op s/p Procedure(s): TOTAL HIP ARTHROPLASTY  Acute blood loss anemia: - Hbg dropped 11.8>7.6 post-op staying stable today, no outward signs of bleeding on bandages. So far asymptomatic. Will start Xarelto.  Weightbearing: WBAT RLE,  Insicional and dressing care: Reinforce dressings as needed VTE prophylaxis: plan to discharge home on Xarelto Pain control: continue current regimen Follow - up plan: 12/22 with Dr. Mardelle Matte Dispo: pending progress with PT today. Plan for home this afternoon.  Contact information:   Merlene Pulling, Hershal Coria [id number] 8-5  After hours and holidays please check [email or web address] for group call information for [facility] 04/21/2022, 9:50 AM

## 2022-04-21 NOTE — Progress Notes (Signed)
Pt is ready for discharge.  PIV removed.  Remains alert and oriented.  AVS given. Pt able to verbalize understanding of all discharge instructions, including wound care.  All questions answered.  Pt will discharge once daughter arrives to take her home.

## 2022-04-22 DIAGNOSIS — D649 Anemia, unspecified: Secondary | ICD-10-CM | POA: Diagnosis not present

## 2022-04-22 DIAGNOSIS — M069 Rheumatoid arthritis, unspecified: Secondary | ICD-10-CM | POA: Diagnosis not present

## 2022-04-22 DIAGNOSIS — E039 Hypothyroidism, unspecified: Secondary | ICD-10-CM | POA: Diagnosis not present

## 2022-04-22 DIAGNOSIS — M3500 Sicca syndrome, unspecified: Secondary | ICD-10-CM | POA: Diagnosis not present

## 2022-04-22 DIAGNOSIS — Z471 Aftercare following joint replacement surgery: Secondary | ICD-10-CM | POA: Diagnosis not present

## 2022-04-22 DIAGNOSIS — M797 Fibromyalgia: Secondary | ICD-10-CM | POA: Diagnosis not present

## 2022-04-22 DIAGNOSIS — B351 Tinea unguium: Secondary | ICD-10-CM | POA: Diagnosis not present

## 2022-04-22 DIAGNOSIS — F329 Major depressive disorder, single episode, unspecified: Secondary | ICD-10-CM | POA: Diagnosis not present

## 2022-04-22 DIAGNOSIS — K219 Gastro-esophageal reflux disease without esophagitis: Secondary | ICD-10-CM | POA: Diagnosis not present

## 2022-04-26 ENCOUNTER — Telehealth: Payer: Self-pay | Admitting: Adult Health

## 2022-04-26 ENCOUNTER — Other Ambulatory Visit: Payer: Medicare PPO

## 2022-04-26 NOTE — Telephone Encounter (Signed)
I received a fax today from [facility] that the patient has had two no show appointments with them. I also got a voice mail from the patient and she was upset because she was told about her no shows and thinks it was our office who said she no showed. She was just upset and said she didn't know about the appointment and was apologetic thinking she wasted peoples time. She hasn't been to our office since August but we did refer her to [facility]. She does not have any upcoming appointments with our office. I tried to call her back to explain the confusion but I got her voice mail and it is full and she has not used MyChart since September 2022.

## 2022-04-29 DIAGNOSIS — M1611 Unilateral primary osteoarthritis, right hip: Secondary | ICD-10-CM | POA: Diagnosis not present

## 2022-05-06 DIAGNOSIS — M1611 Unilateral primary osteoarthritis, right hip: Secondary | ICD-10-CM | POA: Diagnosis not present

## 2022-05-06 DIAGNOSIS — R269 Unspecified abnormalities of gait and mobility: Secondary | ICD-10-CM | POA: Diagnosis not present

## 2022-05-06 DIAGNOSIS — M6281 Muscle weakness (generalized): Secondary | ICD-10-CM | POA: Diagnosis not present

## 2022-05-06 DIAGNOSIS — Z96641 Presence of right artificial hip joint: Secondary | ICD-10-CM | POA: Diagnosis not present

## 2022-05-06 DIAGNOSIS — M25651 Stiffness of right hip, not elsewhere classified: Secondary | ICD-10-CM | POA: Diagnosis not present

## 2022-05-10 DIAGNOSIS — M25651 Stiffness of right hip, not elsewhere classified: Secondary | ICD-10-CM | POA: Diagnosis not present

## 2022-05-10 DIAGNOSIS — Z96641 Presence of right artificial hip joint: Secondary | ICD-10-CM | POA: Diagnosis not present

## 2022-05-10 DIAGNOSIS — M6281 Muscle weakness (generalized): Secondary | ICD-10-CM | POA: Diagnosis not present

## 2022-05-10 DIAGNOSIS — M1611 Unilateral primary osteoarthritis, right hip: Secondary | ICD-10-CM | POA: Diagnosis not present

## 2022-05-10 DIAGNOSIS — R269 Unspecified abnormalities of gait and mobility: Secondary | ICD-10-CM | POA: Diagnosis not present

## 2022-05-13 DIAGNOSIS — M6281 Muscle weakness (generalized): Secondary | ICD-10-CM | POA: Diagnosis not present

## 2022-05-13 DIAGNOSIS — M1611 Unilateral primary osteoarthritis, right hip: Secondary | ICD-10-CM | POA: Diagnosis not present

## 2022-05-13 DIAGNOSIS — Z96641 Presence of right artificial hip joint: Secondary | ICD-10-CM | POA: Diagnosis not present

## 2022-05-13 DIAGNOSIS — M25651 Stiffness of right hip, not elsewhere classified: Secondary | ICD-10-CM | POA: Diagnosis not present

## 2022-05-13 DIAGNOSIS — R269 Unspecified abnormalities of gait and mobility: Secondary | ICD-10-CM | POA: Diagnosis not present

## 2022-05-20 DIAGNOSIS — M25651 Stiffness of right hip, not elsewhere classified: Secondary | ICD-10-CM | POA: Diagnosis not present

## 2022-05-20 DIAGNOSIS — M1611 Unilateral primary osteoarthritis, right hip: Secondary | ICD-10-CM | POA: Diagnosis not present

## 2022-05-20 DIAGNOSIS — M6281 Muscle weakness (generalized): Secondary | ICD-10-CM | POA: Diagnosis not present

## 2022-05-20 DIAGNOSIS — Z96641 Presence of right artificial hip joint: Secondary | ICD-10-CM | POA: Diagnosis not present

## 2022-05-20 DIAGNOSIS — R269 Unspecified abnormalities of gait and mobility: Secondary | ICD-10-CM | POA: Diagnosis not present

## 2022-05-27 DIAGNOSIS — R269 Unspecified abnormalities of gait and mobility: Secondary | ICD-10-CM | POA: Diagnosis not present

## 2022-05-27 DIAGNOSIS — M25651 Stiffness of right hip, not elsewhere classified: Secondary | ICD-10-CM | POA: Diagnosis not present

## 2022-05-27 DIAGNOSIS — Z96641 Presence of right artificial hip joint: Secondary | ICD-10-CM | POA: Diagnosis not present

## 2022-05-27 DIAGNOSIS — M6281 Muscle weakness (generalized): Secondary | ICD-10-CM | POA: Diagnosis not present

## 2022-05-27 DIAGNOSIS — M1611 Unilateral primary osteoarthritis, right hip: Secondary | ICD-10-CM | POA: Diagnosis not present

## 2022-06-10 DIAGNOSIS — M1611 Unilateral primary osteoarthritis, right hip: Secondary | ICD-10-CM | POA: Diagnosis not present

## 2022-06-10 DIAGNOSIS — R269 Unspecified abnormalities of gait and mobility: Secondary | ICD-10-CM | POA: Diagnosis not present

## 2022-06-10 DIAGNOSIS — Z96641 Presence of right artificial hip joint: Secondary | ICD-10-CM | POA: Diagnosis not present

## 2022-06-10 DIAGNOSIS — M6281 Muscle weakness (generalized): Secondary | ICD-10-CM | POA: Diagnosis not present

## 2022-06-10 DIAGNOSIS — M25651 Stiffness of right hip, not elsewhere classified: Secondary | ICD-10-CM | POA: Diagnosis not present

## 2022-07-01 DIAGNOSIS — M1611 Unilateral primary osteoarthritis, right hip: Secondary | ICD-10-CM | POA: Diagnosis not present

## 2022-07-08 DIAGNOSIS — R413 Other amnesia: Secondary | ICD-10-CM | POA: Diagnosis not present

## 2022-07-08 DIAGNOSIS — Z0000 Encounter for general adult medical examination without abnormal findings: Secondary | ICD-10-CM | POA: Diagnosis not present

## 2022-07-08 DIAGNOSIS — Z23 Encounter for immunization: Secondary | ICD-10-CM | POA: Diagnosis not present

## 2022-07-08 DIAGNOSIS — E538 Deficiency of other specified B group vitamins: Secondary | ICD-10-CM | POA: Diagnosis not present

## 2022-07-08 DIAGNOSIS — F329 Major depressive disorder, single episode, unspecified: Secondary | ICD-10-CM | POA: Diagnosis not present

## 2022-07-08 DIAGNOSIS — M3500 Sicca syndrome, unspecified: Secondary | ICD-10-CM | POA: Diagnosis not present

## 2022-07-08 DIAGNOSIS — F39 Unspecified mood [affective] disorder: Secondary | ICD-10-CM | POA: Diagnosis not present

## 2022-07-08 DIAGNOSIS — E039 Hypothyroidism, unspecified: Secondary | ICD-10-CM | POA: Diagnosis not present

## 2022-07-08 DIAGNOSIS — E785 Hyperlipidemia, unspecified: Secondary | ICD-10-CM | POA: Diagnosis not present

## 2022-07-13 ENCOUNTER — Encounter: Payer: Self-pay | Admitting: Podiatry

## 2022-07-13 ENCOUNTER — Ambulatory Visit: Payer: Medicare PPO | Admitting: Podiatry

## 2022-07-13 DIAGNOSIS — B351 Tinea unguium: Secondary | ICD-10-CM | POA: Diagnosis not present

## 2022-07-13 NOTE — Progress Notes (Signed)
This patient presents to the office with chief complaint of long thick painful nails.  Patient says the nails are painful walking and wearing shoes.  This patient is unable to self treat.  This patient is unable to trim her nails since she is unable to reach her nails.  She presents to the office for preventative foot care services.  General Appearance  Alert, conversant and in no acute stress.  Vascular  Dorsalis pedis and posterior tibial  pulses are  weakly palpable  bilaterally.  Capillary return is within normal limits  bilaterally. Temperature is within normal limits  bilaterally.  Neurologic  Senn-Weinstein monofilament wire test within normal limits  bilaterally. Muscle power within normal limits bilaterally.  Nails Thick disfigured discolored nails with subungual debris  from hallux through 3 right foot. No evidence of bacterial infection or drainage bilaterally.  Orthopedic  No limitations of motion  feet .  No crepitus or effusions noted.  No bony pathology or digital deformities noted.  Skin  normotropic skin with no porokeratosis noted bilaterally.  No signs of infections or ulcers noted.     Onychomycosis  Nails  right foot..  Pain in right toes  Pain in left toes  Debridement of nails both feet followed trimming the nails with dremel tool.    RTC 3 months.   Gardiner Barefoot DPM

## 2022-09-06 DIAGNOSIS — Z79899 Other long term (current) drug therapy: Secondary | ICD-10-CM | POA: Diagnosis not present

## 2022-09-06 DIAGNOSIS — M3501 Sicca syndrome with keratoconjunctivitis: Secondary | ICD-10-CM | POA: Diagnosis not present

## 2022-09-06 DIAGNOSIS — H43813 Vitreous degeneration, bilateral: Secondary | ICD-10-CM | POA: Diagnosis not present

## 2022-09-06 DIAGNOSIS — H04123 Dry eye syndrome of bilateral lacrimal glands: Secondary | ICD-10-CM | POA: Diagnosis not present

## 2022-09-12 DIAGNOSIS — R109 Unspecified abdominal pain: Secondary | ICD-10-CM | POA: Diagnosis not present

## 2022-09-12 DIAGNOSIS — R413 Other amnesia: Secondary | ICD-10-CM | POA: Diagnosis not present

## 2022-09-12 DIAGNOSIS — F329 Major depressive disorder, single episode, unspecified: Secondary | ICD-10-CM | POA: Diagnosis not present

## 2022-09-30 ENCOUNTER — Ambulatory Visit: Payer: Medicare PPO | Admitting: Podiatry

## 2022-09-30 ENCOUNTER — Encounter: Payer: Self-pay | Admitting: Podiatry

## 2022-09-30 DIAGNOSIS — B351 Tinea unguium: Secondary | ICD-10-CM | POA: Diagnosis not present

## 2022-10-01 NOTE — Progress Notes (Signed)
This patient presents to the office with chief complaint of long thick painful nails.  Patient says the nails are painful walking and wearing shoes.  This patient is unable to self treat.  This patient is unable to trim her nails since she is unable to reach her nails. She has been applying medicine to her nails right foot and says she has seen improvement to the third toenail right foot.  She has not been seen because her doctor told her to wait until her hip surgery was healed. She presents to the office for preventative foot care services.  General Appearance  Alert, conversant and in no acute stress.  Vascular  Dorsalis pedis and posterior tibial  pulses are  weakly palpable  bilaterally.  Capillary return is within normal limits  bilaterally. Temperature is within normal limits  bilaterally.  Neurologic  Senn-Weinstein monofilament wire test within normal limits  bilaterally. Muscle power within normal limits bilaterally.  Nails Thick disfigured discolored nails with subungual debris  from hallux through 3 right foot. No evidence of bacterial infection or drainage bilaterally.  Orthopedic  No limitations of motion  feet .  No crepitus or effusions noted.  No bony pathology or digital deformities noted.  Skin  normotropic skin with no porokeratosis noted bilaterally.  No signs of infections or ulcers noted.      ROV.  Discussed her fungus nail with patient and told her to pick up Formula 7 as she leaves.  Debridement of nails both feet followed trimming the nails with dremel tool.    RTC 3 months.   Helane Gunther DPM

## 2022-10-13 ENCOUNTER — Ambulatory Visit: Payer: Medicare PPO | Admitting: Podiatry

## 2023-01-04 ENCOUNTER — Encounter: Payer: Self-pay | Admitting: Podiatry

## 2023-01-04 ENCOUNTER — Ambulatory Visit: Payer: Medicare PPO | Admitting: Podiatry

## 2023-01-04 DIAGNOSIS — B351 Tinea unguium: Secondary | ICD-10-CM | POA: Diagnosis not present

## 2023-01-04 DIAGNOSIS — M79676 Pain in unspecified toe(s): Secondary | ICD-10-CM | POA: Diagnosis not present

## 2023-01-04 NOTE — Progress Notes (Signed)
This patient presents to the office with chief complaint of long thick painful nails.  Patient says the nails are painful walking and wearing shoes.  This patient is unable to self treat.  This patient is unable to trim her nails since she is unable to reach her nails.  She presents to the office for preventative foot care services.  General Appearance  Alert, conversant and in no acute stress.  Vascular  Dorsalis pedis and posterior tibial  pulses are  weakly palpable  bilaterally.  Capillary return is within normal limits  bilaterally. Temperature is within normal limits  bilaterally.  Neurologic  Senn-Weinstein monofilament wire test within normal limits  bilaterally. Muscle power within normal limits bilaterally.  Nails Thick disfigured discolored nails with subungual debris  from hallux through 3 right foot. No evidence of bacterial infection or drainage bilaterally.  Orthopedic  No limitations of motion  feet .  No crepitus or effusions noted.  No bony pathology or digital deformities noted.  Skin  normotropic skin with no porokeratosis noted bilaterally.  No signs of infections or ulcers noted.     Onychomycosis  Nails  right foot..  Pain in right toes  Pain in left toes  Debridement of nails both feet followed trimming the nails with dremel tool.    RTC 3 months.   Gregory Mayer DPM   

## 2023-01-13 DIAGNOSIS — G454 Transient global amnesia: Secondary | ICD-10-CM | POA: Diagnosis not present

## 2023-01-13 DIAGNOSIS — M3500 Sicca syndrome, unspecified: Secondary | ICD-10-CM | POA: Diagnosis not present

## 2023-01-13 DIAGNOSIS — E039 Hypothyroidism, unspecified: Secondary | ICD-10-CM | POA: Diagnosis not present

## 2023-01-13 DIAGNOSIS — F39 Unspecified mood [affective] disorder: Secondary | ICD-10-CM | POA: Diagnosis not present

## 2023-01-13 DIAGNOSIS — E785 Hyperlipidemia, unspecified: Secondary | ICD-10-CM | POA: Diagnosis not present

## 2023-01-13 DIAGNOSIS — K2100 Gastro-esophageal reflux disease with esophagitis, without bleeding: Secondary | ICD-10-CM | POA: Diagnosis not present

## 2023-01-13 DIAGNOSIS — B351 Tinea unguium: Secondary | ICD-10-CM | POA: Diagnosis not present

## 2023-01-13 DIAGNOSIS — G5600 Carpal tunnel syndrome, unspecified upper limb: Secondary | ICD-10-CM | POA: Diagnosis not present

## 2023-01-13 DIAGNOSIS — Z87898 Personal history of other specified conditions: Secondary | ICD-10-CM | POA: Diagnosis not present

## 2023-01-31 ENCOUNTER — Telehealth: Payer: Self-pay | Admitting: Adult Health

## 2023-01-31 NOTE — Telephone Encounter (Signed)
Pt will have to be evaluated for the memory concern and a new referral sent to neuropsych if still feel that is the next step

## 2023-01-31 NOTE — Progress Notes (Deleted)
[facility] [address] [place]. [place] [address] [phone]       OFFICE FOLLOW UP NOTE  Jennifer Franco Date of Birth:  [birth year] Medical Record Number:  [id number]   Referring MD: Jennifer Franco  Reason for Referral: Memory loss   No chief complaint on file.     HPI:   Update 02/01/2023 JM: Patient returns requesting follow-up visit with reported memory decline. Preivously seen over 1 year ago, felt memory concerns likely in setting of significant stressors and suboptimally controlled depression/anxiety, was referred to neuropsychology for neurocognitive evaluation but no showed 2 initial consult visits.   PCP adjusted sertraline dose to 50mg  in March, reported persistent symptoms in May but declined further adjustments. Still caring for mother who has dementia.       History provided for reference purposes only Update 12/14/2021 JM: Patient returns per patient request due to memory loss concerns.   She reports continued difficulty with both short-term and long-term memory loss.  Reports episode back in July where she had difficulty remembering the past 3 days similar to episode back in March.  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  She also endorses some family stressors which have been contributing.  She does admit to underlying depression/anxiety currently on Lexapro but denies much benefit. She is frustrated that she could not remember previously seeing Dr. Pearlean Franco 4 months ago (although remember the office) and had to use GPS to get to the office (although only been seen here 1x). She has been trying to work on weight loss and in fact has lost about 20 pounds since prior visit.  She continues to maintain ADLs and IADLs as well as driving without difficulty.  No further concerns today.  Consult visit 07/29/2021 Dr. Pearlean Franco: Jennifer Franco is a 67 year old pleasant lady seen today for  initial office consultation visit for memory loss episode.  She has past medical history of hypothyroidism, rheumatoid arthritis, morbid obesity, Chardon syndrome, reflux esophagitis who presented on 03/30/2021 to [facility] room with disorientation and confusion and memory difficulties.  She repeatedly asked same question over and over again symptoms are concerning for transient global amnesia and CT scan of the head was done which was negative for acute abnormality.  She was transferred to [facility] for further evaluation.  Upon speaking with the daughter and obtaining more detailed history patient has a history of baseline depression.  She actually had very poor recollection of events for the last several days and actually went up 2 weeks of Thanksgiving.  She did not remember that they had a Thanksgiving meal and events of that day.  She did endorse significant stress particularly due to concern about taking care of her mother with advanced dementia.  When patient was evaluated by me and her daughter at the bedside she actually stated she had no recollection for events going back up to 1 week and even longer.  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  With 3 word recall was 1/3 and she struggled to name animals.  MRI scan of the brain showed no acute abnormality.  There was questionable punctate hyperintensity in the right hippocampus but not a definite finding.  MR angiogram showed no acute abnormality in the brain carotid ultrasound showed no significant extracranial stenosis.  LDL cholesterol 128 mg percent.  Hemoglobin A1c was 5.8.  Vitamin B12  levels were low at 135 and she was started on replacement.  Patient was seen by psychiatry and felt underlying stress and depression could have contributed.  Patient was discharged home and was doing better but returned back to the ER on 07/17/2021 for confusion and did not remember  events from the day before.  She did have a lot of vomiting on that day.  ER physician felt her symptoms were psychosomatic due to severe stress CT scan of the head was obtained which showed no acute abnormality.  Patient feels this episode was different and she was clearly aware of what was happening and was just not feeling well and was nauseous.  She could remember events of the hospitalization..  On cognitive evaluation today in the office she scored 0/3 on recall but was able to name 12 animals which can walk on 4 legs and clock drawing was 4/4.  On Mini-Mental status exam she scored 29/30.  On geriatric depression scale she scored 7 suggestive of mild depression.     ROS:   14 system review of systems is positive for those listed in HPI and all other systems negative  PMH:  Past Medical History:  Diagnosis Date   Anemia    Anginal pain (HCC)    anxiety   Anxiety    Cancer (HCC)    bladder   Chills    CTS (carpal tunnel syndrome)    LEFT   Cyclical vomiting    Depression    Diarrhea, unspecified    Diverticulitis    Fibromyalgia    Hemorrhoids    History of adenomatous polyp of colon    History of morbid obesity    Hypothyroid    Hypothyroidism    Intractable nausea and vomiting    Irregular bowel habits    Joint pain    LLQ abdominal pain    Morbid obesity (HCC)    Nausea and vomiting    Onychomycosis    RA (rheumatoid arthritis) (HCC)    Reflux esophagitis    Reflux esophagitis    Sjogren's syndrome (HCC)    TIA (transient ischemic attack)    2 within 2022-2023   Tinea corporis    Unspecified mood (affective) disorder (HCC)    Weight loss     Social History:  Social History   Socioeconomic History   Marital status: Divorced    Spouse name: Not on file   Number of children: Not on file   Years of education: Not on file   Highest education level: Not on file  Occupational History   Not on file  Tobacco Use   Smoking status: Former   Smokeless tobacco:  Never  Vaping Use   Vaping status: Never Used  Substance and Sexual Activity   Alcohol use: Not Currently   Drug use: No   Sexual activity: Not Currently  Other Topics Concern   Not on file  Social History Narrative   Not on file   Social Determinants of Health   Financial Resource Strain: Not on file  Food Insecurity: Patient Declined (04/19/2022)   Hunger Vital Sign    Worried About Running Out of Food in the Last Year: Patient declined    Ran Out of Food in the Last Year: Patient declined  Transportation Needs: No Transportation Needs (04/19/2022)   PRAPARE - Administrator, Civil Service (Medical): No    Lack of Transportation (Non-Medical): No  Physical Activity: Not on file  Stress: Not on  file  Social Connections: Not on file  Intimate Partner Violence: Not At Risk (04/19/2022)   Humiliation, Afraid, Rape, and Kick questionnaire    Fear of Current or Ex-Partner: No    Emotionally Abused: No    Physically Abused: No    Sexually Abused: No    Medications:   Current Outpatient Medications on File Prior to Visit  Medication Sig Dispense Refill   atorvastatin (LIPITOR) 20 MG tablet Take 1 tablet (20 mg total) by mouth daily. 30 tablet 2   baclofen (LIORESAL) 10 MG tablet Take 1 tablet (10 mg total) by mouth 3 (three) times daily. As needed for muscle spasm 50 tablet 0   Calcium Carb-Cholecalciferol (CALCIUM 600 + D) 600-5 MG-MCG TABS Take 1,000 Units by mouth daily.     cyanocobalamin (VITAMIN B12) 1000 MCG tablet Take 1,000 mcg by mouth every other day.     ferrous sulfate 325 (65 FE) MG tablet Take 25 mg by mouth daily with breakfast.     folic acid (FOLVITE) 1 MG tablet Take 1 mg by mouth daily.     HYDROcodone-acetaminophen (NORCO) 10-325 MG tablet Take 1 tablet by mouth every 6 (six) hours as needed. 28 tablet 0   hydroxychloroquine (PLAQUENIL) 200 MG tablet Take 200 mg by mouth daily.     levothyroxine (SYNTHROID) 112 MCG tablet Take 125 mcg by mouth  daily.     ondansetron (ZOFRAN) 4 MG tablet Take 1 tablet (4 mg total) by mouth every 8 (eight) hours as needed for nausea or vomiting. 10 tablet 0   rivaroxaban (XARELTO) 10 MG TABS tablet Take 1 tablet (10 mg total) by mouth daily. 30 tablet 0   sennosides-docusate sodium (SENOKOT-S) 8.6-50 MG tablet Take 2 tablets by mouth daily. 30 tablet 1   sertraline (ZOLOFT) 25 MG tablet Take 1 tablet (25 mg total) by mouth daily. 30 tablet 5   VITAMIN D PO Take 1 tablet by mouth daily.     No current facility-administered medications on file prior to visit.    Allergies:   Allergies  Allergen Reactions   Etodolac Diarrhea   Topamax [Topiramate] Other (See Comments)    Brain flutters     Physical Exam There were no vitals filed for this visit.  There is no height or weight on file to calculate BMI.   General: well developed, well nourished very pleasant middle-aged African-American lady, seated, in no evident distress Head: head normocephalic and atraumatic.   Neck: supple with no carotid or supraclavicular bruits Cardiovascular: regular rate and rhythm, no murmurs Musculoskeletal: no deformity Skin:  no rash/petichiae Vascular:  Normal pulses all extremities  Neurologic Exam Mental Status: Awake and fully alert. Oriented to place and time. Recent and remote memory subjectively impaired. Attention span, concentration and fund of knowledge appropriate. Mood and affect appropriate.      12/14/2021    1:55 PM 07/29/2021    9:53 AM  MMSE - Mini Mental State Exam  Orientation to time 5 5  Orientation to Place 5 5  Registration 3 3  Attention/ Calculation 1 5  Recall 3 2  Language- name 2 objects 2 2  Language- repeat 1 1  Language- follow 3 step command 3 3  Language- read & follow direction 1 1  Write a sentence 1 1  Copy design 1 1  Total score 26 29   Cranial Nerves: Pupils equal, briskly reactive to light. Extraocular movements full without nystagmus. Visual fields full to  confrontation. Hearing intact. Facial  sensation intact. Face, tongue, palate moves normally and symmetrically.  Motor: Normal bulk and tone. Normal strength in all tested extremity muscles. Sensory.: intact to touch , pinprick , position and vibratory sensation.  Coordination: Rapid alternating movements normal in all extremities. Finger-to-nose and heel-to-shin performed accurately bilaterally. Gait and Station: Arises from chair without difficulty. Stance is normal. Gait demonstrates normal stride length and balance . Able to heel, toe and tandem walk with difficulty.  Reflexes: 1+ and symmetric. Toes downgoing.         ASSESSMENT/PLAN: 67 year old African-American lady with recurrent memory loss episodes in 11/2021, 07/2021 and 03/2021. Unclear etiology with extensive workup unremarkable, MRI brain unremarkable, possibly transient global amnesia (although duration of episodes quite prolonged and lack of improvement over time argues against this) but underlying significant stress may have contributed     -She has significant amount of stressors and suspect largely contributing.  She is greatly concerned of developing dementia as she is currently caring for her mother who has dementia.  Previously placed referral for neuropsychology but no showed initial consult visits *** -Encouraged routine follow-up with PCP for depression/anxiety management, consider establishing care with behavioral health -Lab work for reversible causes unremarkable -increase participation in cognitively challenging activities like solving crossword puzzles, playing bridge and sudoku.  We also discussed memory compensation strategies.       CC:  Irven Coe, MD   I spent 36 minutes of face-to-face and non-face-to-face time with patient.  This included previsit chart review, lab review, study review, order entry, electronic health record documentation, patient education and discussion regarding transient memory loss  and continued memory loss concerns, review and discussion of MMSE, further evaluation and answered all other questions to patient satisfaction  Ihor Austin, [facility]  [facility] [address] [place], [place] [address]  Phone [phone] Fax [phone] Note: This document was prepared with digital dictation and possible smart phrase technology. Any transcriptional errors that result from this process are unintentional.

## 2023-01-31 NOTE — Telephone Encounter (Signed)
Patient was seen 1 year ago for memory which was suspected to be impacted by underlying stressors as well as underlying depression/anxiety. She was referred to neuropsychology for further evaluation but I do not see where this has been done. If still having memory concerns, it would still be recommended she pursue neurocognitive testing.  She is following with PCP for depression management. Unfortunately, I do not have anything else to offer patient at this time and can continue to follow with PCP. No need for visit tomorrow. Please advise patient.

## 2023-01-31 NOTE — Telephone Encounter (Signed)
Pt called on 9/19 and requested appointment for concern that memory is worsening and failing to improve, pt agreed to offered appointment on 9/25 with arrival of 8:45 for 9:15 appointment.  This is FYI for [place], NP and POD 3, no call back requested

## 2023-02-01 ENCOUNTER — Ambulatory Visit: Payer: Medicare PPO | Admitting: Adult Health

## 2023-02-02 DIAGNOSIS — N3941 Urge incontinence: Secondary | ICD-10-CM | POA: Diagnosis not present

## 2023-02-02 DIAGNOSIS — C679 Malignant neoplasm of bladder, unspecified: Secondary | ICD-10-CM | POA: Diagnosis not present

## 2023-02-03 DIAGNOSIS — M160 Bilateral primary osteoarthritis of hip: Secondary | ICD-10-CM | POA: Diagnosis not present

## 2023-02-13 DIAGNOSIS — Z23 Encounter for immunization: Secondary | ICD-10-CM | POA: Diagnosis not present

## 2023-02-13 DIAGNOSIS — R3915 Urgency of urination: Secondary | ICD-10-CM | POA: Diagnosis not present

## 2023-02-13 DIAGNOSIS — Z0000 Encounter for general adult medical examination without abnormal findings: Secondary | ICD-10-CM | POA: Diagnosis not present

## 2023-02-15 DIAGNOSIS — N3941 Urge incontinence: Secondary | ICD-10-CM | POA: Diagnosis not present

## 2023-02-15 DIAGNOSIS — M6289 Other specified disorders of muscle: Secondary | ICD-10-CM | POA: Diagnosis not present

## 2023-02-15 DIAGNOSIS — M6281 Muscle weakness (generalized): Secondary | ICD-10-CM | POA: Diagnosis not present

## 2023-04-05 ENCOUNTER — Ambulatory Visit: Payer: Medicare PPO | Admitting: Podiatry

## 2023-04-05 ENCOUNTER — Encounter: Payer: Self-pay | Admitting: Podiatry

## 2023-04-05 DIAGNOSIS — B351 Tinea unguium: Secondary | ICD-10-CM | POA: Diagnosis not present

## 2023-04-05 NOTE — Progress Notes (Signed)
This patient presents to the office with chief complaint of long thick painful nails.  Patient says the nails are painful walking and wearing shoes.  This patient is unable to self treat.  This patient is unable to trim her nails since she is unable to reach her nails.  She presents to the office for preventative foot care services.  General Appearance  Alert, conversant and in no acute stress.  Vascular  Dorsalis pedis and posterior tibial  pulses are  weakly palpable  bilaterally.  Capillary return is within normal limits  bilaterally. Temperature is within normal limits  bilaterally.  Neurologic  Senn-Weinstein monofilament wire test within normal limits  bilaterally. Muscle power within normal limits bilaterally.  Nails Thick disfigured discolored nails with subungual debris  from hallux through 3 right foot. No evidence of bacterial infection or drainage bilaterally.  Orthopedic  No limitations of motion  feet .  No crepitus or effusions noted.  No bony pathology or digital deformities noted.  Skin  normotropic skin with no porokeratosis noted bilaterally.  No signs of infections or ulcers noted.     Onychomycosis  Nails  right foot..  Pain in right toes  Pain in left toes  Debridement of nails both feet followed trimming the nails with dremel tool.    RTC 3 months.   Helane Gunther DPM

## 2023-04-07 ENCOUNTER — Ambulatory Visit: Payer: Medicare PPO | Admitting: Podiatry

## 2023-04-11 DIAGNOSIS — Z8551 Personal history of malignant neoplasm of bladder: Secondary | ICD-10-CM | POA: Diagnosis not present

## 2023-04-11 DIAGNOSIS — E785 Hyperlipidemia, unspecified: Secondary | ICD-10-CM | POA: Diagnosis not present

## 2023-04-11 DIAGNOSIS — M8588 Other specified disorders of bone density and structure, other site: Secondary | ICD-10-CM | POA: Diagnosis not present

## 2023-04-11 DIAGNOSIS — E039 Hypothyroidism, unspecified: Secondary | ICD-10-CM | POA: Diagnosis not present

## 2023-04-11 DIAGNOSIS — K2100 Gastro-esophageal reflux disease with esophagitis, without bleeding: Secondary | ICD-10-CM | POA: Diagnosis not present

## 2023-04-14 DIAGNOSIS — R7989 Other specified abnormal findings of blood chemistry: Secondary | ICD-10-CM | POA: Diagnosis not present

## 2023-04-14 DIAGNOSIS — E039 Hypothyroidism, unspecified: Secondary | ICD-10-CM | POA: Diagnosis not present

## 2023-04-14 DIAGNOSIS — Z8551 Personal history of malignant neoplasm of bladder: Secondary | ICD-10-CM | POA: Diagnosis not present

## 2023-04-14 DIAGNOSIS — M3500 Sicca syndrome, unspecified: Secondary | ICD-10-CM | POA: Diagnosis not present

## 2023-04-14 DIAGNOSIS — M8580 Other specified disorders of bone density and structure, unspecified site: Secondary | ICD-10-CM | POA: Diagnosis not present

## 2023-04-14 DIAGNOSIS — E785 Hyperlipidemia, unspecified: Secondary | ICD-10-CM | POA: Diagnosis not present

## 2023-04-14 DIAGNOSIS — F329 Major depressive disorder, single episode, unspecified: Secondary | ICD-10-CM | POA: Diagnosis not present

## 2023-04-14 DIAGNOSIS — K2100 Gastro-esophageal reflux disease with esophagitis, without bleeding: Secondary | ICD-10-CM | POA: Diagnosis not present

## 2023-04-14 DIAGNOSIS — G454 Transient global amnesia: Secondary | ICD-10-CM | POA: Diagnosis not present

## 2023-05-15 DIAGNOSIS — N3941 Urge incontinence: Secondary | ICD-10-CM | POA: Diagnosis not present

## 2023-05-15 DIAGNOSIS — C679 Malignant neoplasm of bladder, unspecified: Secondary | ICD-10-CM | POA: Diagnosis not present

## 2023-05-23 DIAGNOSIS — Z79899 Other long term (current) drug therapy: Secondary | ICD-10-CM | POA: Diagnosis not present

## 2023-05-23 DIAGNOSIS — M3501 Sicca syndrome with keratoconjunctivitis: Secondary | ICD-10-CM | POA: Diagnosis not present

## 2023-07-10 ENCOUNTER — Ambulatory Visit: Payer: Medicare PPO | Admitting: Podiatry

## 2023-07-10 ENCOUNTER — Encounter: Payer: Self-pay | Admitting: Podiatry

## 2023-07-10 DIAGNOSIS — B351 Tinea unguium: Secondary | ICD-10-CM | POA: Diagnosis not present

## 2023-07-10 NOTE — Progress Notes (Addendum)
 This patient presents to the office with chief complaint of long thick painful nails.  Patient says the nails are painful walking and wearing shoes.  This patient is unable to self treat.  This patient is unable to trim her nails since she is unable to reach her nails.  She presents to the office for preventative foot care services.  General Appearance  Alert, conversant and in no acute stress.  Vascular  Dorsalis pedis and posterior tibial  pulses are  weakly palpable  bilaterally.  Capillary return is within normal limits  bilaterally. Temperature is within normal limits  bilaterally.  Neurologic  Senn-Weinstein monofilament wire test within normal limits  bilaterally. Muscle power within normal limits bilaterally.  Nails Thick disfigured discolored nails with subungual debris  from hallux through 3 right foot. No evidence of bacterial infection or drainage bilaterally.  Orthopedic  No limitations of motion  feet .  No crepitus or effusions noted.  No bony pathology or digital deformities noted.  Skin  normotropic skin with no porokeratosis noted bilaterally.  No signs of infections or ulcers noted.     Onychomycosis  Nails  right foot..  Pain in right toes  Pain in left toes  Debridement of nails both feet followed trimming the nails with dremel tool.   Patient to purchase moisturizer. RTC 3 months.   Helane Gunther DPM

## 2023-07-19 DIAGNOSIS — H0011 Chalazion right upper eyelid: Secondary | ICD-10-CM | POA: Diagnosis not present

## 2023-07-19 DIAGNOSIS — L259 Unspecified contact dermatitis, unspecified cause: Secondary | ICD-10-CM | POA: Diagnosis not present

## 2023-08-31 DIAGNOSIS — Z8249 Family history of ischemic heart disease and other diseases of the circulatory system: Secondary | ICD-10-CM | POA: Diagnosis not present

## 2023-08-31 DIAGNOSIS — M1990 Unspecified osteoarthritis, unspecified site: Secondary | ICD-10-CM | POA: Diagnosis not present

## 2023-08-31 DIAGNOSIS — F329 Major depressive disorder, single episode, unspecified: Secondary | ICD-10-CM | POA: Diagnosis not present

## 2023-08-31 DIAGNOSIS — E785 Hyperlipidemia, unspecified: Secondary | ICD-10-CM | POA: Diagnosis not present

## 2023-08-31 DIAGNOSIS — R030 Elevated blood-pressure reading, without diagnosis of hypertension: Secondary | ICD-10-CM | POA: Diagnosis not present

## 2023-08-31 DIAGNOSIS — M069 Rheumatoid arthritis, unspecified: Secondary | ICD-10-CM | POA: Diagnosis not present

## 2023-08-31 DIAGNOSIS — G3184 Mild cognitive impairment, so stated: Secondary | ICD-10-CM | POA: Diagnosis not present

## 2023-08-31 DIAGNOSIS — K219 Gastro-esophageal reflux disease without esophagitis: Secondary | ICD-10-CM | POA: Diagnosis not present

## 2023-08-31 DIAGNOSIS — R32 Unspecified urinary incontinence: Secondary | ICD-10-CM | POA: Diagnosis not present

## 2023-10-12 ENCOUNTER — Ambulatory Visit: Admitting: Podiatry

## 2023-10-12 ENCOUNTER — Encounter: Payer: Self-pay | Admitting: Podiatry

## 2023-10-12 DIAGNOSIS — B351 Tinea unguium: Secondary | ICD-10-CM

## 2023-10-12 NOTE — Progress Notes (Signed)
 This patient presents to the office with chief complaint of long thick painful nails.  Patient says the nails are painful walking and wearing shoes.  This patient is unable to self treat.  This patient is unable to trim her nails since she is unable to reach her nails.  She presents to the office for preventative foot care services.  General Appearance  Alert, conversant and in no acute stress.  Vascular  Dorsalis pedis and posterior tibial  pulses are  weakly palpable  bilaterally.  Capillary return is within normal limits  bilaterally. Temperature is within normal limits  bilaterally.  Neurologic  Senn-Weinstein monofilament wire test within normal limits  bilaterally. Muscle power within normal limits bilaterally.  Nails Thick disfigured discolored nails with subungual debris  from hallux through 3 right foot. No evidence of bacterial infection or drainage bilaterally.  Orthopedic  No limitations of motion  feet .  No crepitus or effusions noted.  No bony pathology or digital deformities noted.  Skin  normotropic skin with no porokeratosis noted bilaterally.  No signs of infections or ulcers noted.     Onychomycosis  Nails  right foot..  Pain in right toes  Pain in left toes  Debridement of nails both feet followed trimming the nails with dremel tool.   Patient to purchase moisturizer. RTC 10 weeks    Ruffin Cotton DPM

## 2023-10-18 ENCOUNTER — Other Ambulatory Visit: Payer: Self-pay | Admitting: Family Medicine

## 2023-10-18 DIAGNOSIS — Z0000 Encounter for general adult medical examination without abnormal findings: Secondary | ICD-10-CM | POA: Diagnosis not present

## 2023-10-18 DIAGNOSIS — M8580 Other specified disorders of bone density and structure, unspecified site: Secondary | ICD-10-CM | POA: Diagnosis not present

## 2023-10-18 DIAGNOSIS — Z8551 Personal history of malignant neoplasm of bladder: Secondary | ICD-10-CM | POA: Diagnosis not present

## 2023-10-18 DIAGNOSIS — E039 Hypothyroidism, unspecified: Secondary | ICD-10-CM | POA: Diagnosis not present

## 2023-10-18 DIAGNOSIS — E785 Hyperlipidemia, unspecified: Secondary | ICD-10-CM | POA: Diagnosis not present

## 2023-10-18 DIAGNOSIS — Z1231 Encounter for screening mammogram for malignant neoplasm of breast: Secondary | ICD-10-CM

## 2023-10-18 DIAGNOSIS — K2100 Gastro-esophageal reflux disease with esophagitis, without bleeding: Secondary | ICD-10-CM | POA: Diagnosis not present

## 2023-10-18 DIAGNOSIS — E538 Deficiency of other specified B group vitamins: Secondary | ICD-10-CM | POA: Diagnosis not present

## 2023-10-18 DIAGNOSIS — G454 Transient global amnesia: Secondary | ICD-10-CM | POA: Diagnosis not present

## 2023-10-18 DIAGNOSIS — F39 Unspecified mood [affective] disorder: Secondary | ICD-10-CM | POA: Diagnosis not present

## 2023-10-18 DIAGNOSIS — R7301 Impaired fasting glucose: Secondary | ICD-10-CM | POA: Diagnosis not present

## 2023-10-19 ENCOUNTER — Other Ambulatory Visit (HOSPITAL_BASED_OUTPATIENT_CLINIC_OR_DEPARTMENT_OTHER): Payer: Self-pay | Admitting: Family Medicine

## 2023-10-19 DIAGNOSIS — M8588 Other specified disorders of bone density and structure, other site: Secondary | ICD-10-CM

## 2023-10-23 DIAGNOSIS — H00021 Hordeolum internum right upper eyelid: Secondary | ICD-10-CM | POA: Diagnosis not present

## 2023-11-23 DIAGNOSIS — H0011 Chalazion right upper eyelid: Secondary | ICD-10-CM | POA: Diagnosis not present

## 2023-12-21 ENCOUNTER — Ambulatory Visit: Admitting: Podiatry

## 2023-12-21 DIAGNOSIS — B351 Tinea unguium: Secondary | ICD-10-CM

## 2023-12-21 NOTE — Progress Notes (Signed)
 This patient presents to the office with chief complaint of long thick painful nails.  Patient says the nails are painful walking and wearing shoes.  This patient is unable to self treat.  This patient is unable to trim her nails since she is unable to reach her nails.  She presents to the office for preventative foot care services.  General Appearance  Alert, conversant and in no acute stress.  Vascular  Dorsalis pedis and posterior tibial  pulses are  weakly palpable  bilaterally.  Capillary return is within normal limits  bilaterally. Temperature is within normal limits  bilaterally.  Neurologic  Senn-Weinstein monofilament wire test within normal limits  bilaterally. Muscle power within normal limits bilaterally.  Nails Thick disfigured discolored nails with subungual debris  from hallux through 3 right foot. No evidence of bacterial infection or drainage bilaterally.  Orthopedic  No limitations of motion  feet .  No crepitus or effusions noted.  No bony pathology or digital deformities noted.  Skin  normotropic skin with no porokeratosis noted bilaterally.  No signs of infections or ulcers noted.     Onychomycosis  Nails  right foot..  Pain in right toes  Pain in left toes  Debridement of nails both feet followed trimming the nails with dremel tool.   Patient to purchase moisturizer. RTC 10 weeks    Ruffin Cotton DPM

## 2023-12-29 DIAGNOSIS — H31091 Other chorioretinal scars, right eye: Secondary | ICD-10-CM | POA: Diagnosis not present

## 2023-12-29 DIAGNOSIS — H43813 Vitreous degeneration, bilateral: Secondary | ICD-10-CM | POA: Diagnosis not present

## 2023-12-29 DIAGNOSIS — H0011 Chalazion right upper eyelid: Secondary | ICD-10-CM | POA: Diagnosis not present

## 2024-02-14 ENCOUNTER — Ambulatory Visit
Admission: RE | Admit: 2024-02-14 | Discharge: 2024-02-14 | Disposition: A | Discharge status: Home / Self Care | Source: Ambulatory Visit | Attending: Family Medicine | Admitting: Family Medicine

## 2024-02-14 DIAGNOSIS — Z01419 Encounter for gynecological examination (general) (routine) without abnormal findings: Secondary | ICD-10-CM | POA: Diagnosis not present

## 2024-02-14 DIAGNOSIS — Z803 Family history of malignant neoplasm of breast: Secondary | ICD-10-CM | POA: Diagnosis not present

## 2024-02-14 DIAGNOSIS — Z1231 Encounter for screening mammogram for malignant neoplasm of breast: Secondary | ICD-10-CM

## 2024-02-14 DIAGNOSIS — Z124 Encounter for screening for malignant neoplasm of cervix: Secondary | ICD-10-CM | POA: Diagnosis not present

## 2024-03-22 ENCOUNTER — Ambulatory Visit: Admitting: Podiatry

## 2024-03-29 ENCOUNTER — Ambulatory Visit: Admitting: Podiatry

## 2024-03-29 ENCOUNTER — Encounter: Payer: Self-pay | Admitting: Podiatry

## 2024-03-29 DIAGNOSIS — B351 Tinea unguium: Secondary | ICD-10-CM

## 2024-03-29 DIAGNOSIS — M79674 Pain in right toe(s): Secondary | ICD-10-CM | POA: Diagnosis not present

## 2024-03-29 DIAGNOSIS — M79675 Pain in left toe(s): Secondary | ICD-10-CM | POA: Diagnosis not present

## 2024-03-29 NOTE — Progress Notes (Signed)
 This patient presents to the office with chief complaint of long thick painful nails.  Patient says the nails are painful walking and wearing shoes.  This patient is unable to self treat.  This patient is unable to trim her nails since she is unable to reach her nails.  She presents to the office for preventative foot care services.  General Appearance  Alert, conversant and in no acute stress.  Vascular  Dorsalis pedis and posterior tibial  pulses are  weakly palpable  bilaterally.  Capillary return is within normal limits  bilaterally. Temperature is within normal limits  bilaterally.  Neurologic  Senn-Weinstein monofilament wire test within normal limits  bilaterally. Muscle power within normal limits bilaterally.  Nails Thick disfigured discolored nails with subungual debris  from hallux through 3 right foot. No evidence of bacterial infection or drainage bilaterally.  Orthopedic  No limitations of motion  feet .  No crepitus or effusions noted.  No bony pathology or digital deformities noted.  Skin  normotropic skin with no porokeratosis noted bilaterally.  No signs of infections or ulcers noted.     Onychomycosis  Nails  right foot..  Pain in right toes  Pain in left toes  Debridement of nails both feet followed trimming the nails with dremel tool.   RTC 10 weeks    Cordella Bold DPM

## 2024-07-23 ENCOUNTER — Institutional Professional Consult (permissible substitution): Admitting: Neurology
